# Patient Record
Sex: Female | Born: 1975
Health system: Southern US, Community
[De-identification: ages and names within clinical notes are randomized; demographics above are authoritative.]

## PROBLEM LIST (undated history)

## (undated) DIAGNOSIS — F410 Panic disorder [episodic paroxysmal anxiety] without agoraphobia: Secondary | ICD-10-CM

## (undated) DIAGNOSIS — I10 Essential (primary) hypertension: Secondary | ICD-10-CM

## (undated) DIAGNOSIS — J45909 Unspecified asthma, uncomplicated: Secondary | ICD-10-CM

## (undated) DIAGNOSIS — K659 Peritonitis, unspecified: Secondary | ICD-10-CM

## (undated) DIAGNOSIS — J189 Pneumonia, unspecified organism: Secondary | ICD-10-CM

## (undated) DIAGNOSIS — E669 Obesity, unspecified: Secondary | ICD-10-CM

## (undated) DIAGNOSIS — G43909 Migraine, unspecified, not intractable, without status migrainosus: Secondary | ICD-10-CM

## (undated) HISTORY — DX: Panic disorder (episodic paroxysmal anxiety): F41.0

## (undated) HISTORY — DX: Migraine, unspecified, not intractable, without status migrainosus: G43.909

## (undated) HISTORY — DX: Pneumonia, unspecified organism: J18.9

---

## 2007-06-24 HISTORY — PX: ABDOMINAL HYSTERECTOMY: SHX81

## 2012-06-23 DIAGNOSIS — K659 Peritonitis, unspecified: Secondary | ICD-10-CM

## 2012-06-23 HISTORY — DX: Peritonitis, unspecified: K65.9

## 2014-01-06 ENCOUNTER — Encounter (HOSPITAL_COMMUNITY): Payer: Self-pay | Admitting: Emergency Medicine

## 2014-01-06 ENCOUNTER — Emergency Department (HOSPITAL_COMMUNITY)
Admission: EM | Admit: 2014-01-06 | Discharge: 2014-01-06 | Disposition: A | Discharge status: Home / Self Care | Payer: Medicaid Other | Source: Home / Self Care | Attending: Emergency Medicine | Admitting: Emergency Medicine

## 2014-01-06 DIAGNOSIS — T300 Burn of unspecified body region, unspecified degree: Secondary | ICD-10-CM

## 2014-01-06 DIAGNOSIS — Y92009 Unspecified place in unspecified non-institutional (private) residence as the place of occurrence of the external cause: Secondary | ICD-10-CM

## 2014-01-06 DIAGNOSIS — IMO0002 Reserved for concepts with insufficient information to code with codable children: Secondary | ICD-10-CM

## 2014-01-06 DIAGNOSIS — T304 Corrosion of unspecified body region, unspecified degree: Secondary | ICD-10-CM

## 2014-01-06 MED ORDER — HYDROCODONE-ACETAMINOPHEN 5-325 MG PO TABS
ORAL_TABLET | ORAL | Status: DC
Start: 2014-01-06 — End: 2014-10-05

## 2014-01-06 MED ORDER — SILVER SULFADIAZINE 1 % EX CREA: 1.0000 "application " | TOPICAL_CREAM | Freq: Every day | CUTANEOUS | Status: DC

## 2014-01-06 NOTE — Discharge Instructions (Signed)
Burn Care Your skin is a natural barrier to infection. It is the largest organ of your body. Burns damage this natural protection. To help prevent infection, it is very important to follow your caregiver's instructions in the care of your burn. Burns are classified as:  First degree. There is only redness of the skin (erythema). No scarring is expected.  Second degree. There is blistering of the skin. Scarring may occur with deeper burns.  Third degree. All layers of the skin are injured, and scarring is expected. HOME CARE INSTRUCTIONS   Wash your hands well before changing your bandage.  Change your bandage as often as directed by your caregiver.  Remove the old bandage. If the bandage sticks, you may soak it off with cool, clean water.  Cleanse the burn thoroughly but gently with mild soap and water.  Pat the area dry with a clean, dry cloth.  Apply a thin layer of antibacterial cream to the burn.  Apply a clean bandage as instructed by your caregiver.  Keep the bandage as clean and dry as possible.  Elevate the affected area for the first 24 hours, then as instructed by your caregiver.  Only take over-the-counter or prescription medicines for pain, discomfort, or fever as directed by your caregiver. SEEK IMMEDIATE MEDICAL CARE IF:   You develop excessive pain.  You develop redness, tenderness, swelling, or red streaks near the burn.  The burned area develops yellowish-white fluid (pus) or a bad smell.  You have a fever. MAKE SURE YOU:   Understand these instructions.  Will watch your condition.  Will get help right away if you are not doing well or get worse. Document Released: 06/09/2005 Document Revised: 09/01/2011 Document Reviewed: 10/30/2010 [facility] Patient Information 2015 [place], [place]. This information is not intended to replace advice given to you by your health care provider. Make sure you discuss any questions you have with your health care  provider.  Chemical Burn Many chemicals can burn the skin. A chemical burn should be flushed with cool water and checked by an emergency caregiver. Your skin is a natural barrier to infection. It is the largest organ of your body. Burns damage this natural protection. To help prevent infection, it is very important to follow your caregiver's instructions in the care of your burn.  Many industrial chemicals may cause burns. These chemicals include acids, alkalis, and organic compounds such as petroleum, phenol, bitumen, tar, and grease. When acids come in contact with the skin, they cause an immediate change in the skin.Acid burns produce significant pain and form a scab (eschar). Usually, the immediate skin changes are the only damage from an acid burn.However, exposure to formic acid, chromic acid, or hydrofluoric acid may affect the whole body and may even be life-threatening. Alkalis include lye, cement, lime, and many chemicals with "hydroxide" in their name.An alkali burn may be less apparent than an acid burn at first. However, alkalis may cause greater tissue damage.It is important to be aware of any chemicals you are using. Treat any exposure to skin, eyes, or mucous membranes (nose, mouth, throat) as a potential emergency. PREVENTION  Avoid exposure to toxic chemicals that can cause burns.  Store chemicals out of the reach of children.  Use protective gloves when handling dangerous chemicals. HOME CARE INSTRUCTIONS   Wash your hands well before changing your bandage.  Change your bandage as often as directed by your caregiver.  Remove the old bandage. If the bandage sticks, you may soak it off  with cool, clean water.  Cleanse the burn thoroughly but gently with mild soap and water.  Pat the area dry with a clean, dry cloth.  Apply a thin layer of antibacterial cream to the burn.  Apply a clean bandage as instructed by your caregiver.  Keep the bandage as clean and dry as  possible.  Elevate the affected area for the first 24 hours, then as instructed by your caregiver.  Only take over-the-counter or prescription medicines for pain, discomfort, or fever as directed by your caregiver.  Keep all follow-up appointments.This is important. This is how your caregiver can tell if your treatment is working. SEEK IMMEDIATE MEDICAL CARE IF:   You develop excessive pain.  You develop redness, tenderness, swelling, or red streaks near the burn.  The burned area develops yellowish-white fluid (pus) or a bad smell.  You have a fever. MAKE SURE YOU:   Understand these instructions.  Will watch your condition.  Will get help right away if you are not doing well or get worse. Document Released: 03/15/2004 Document Revised: 09/01/2011 Document Reviewed: 11/04/2010 [facility] Patient Information 2015 [place], [place]. This information is not intended to replace advice given to you by your health care provider. Make sure you discuss any questions you have with your health care provider.
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
## 2014-03-20 ENCOUNTER — Emergency Department (HOSPITAL_COMMUNITY)
Admission: EM | Admit: 2014-03-20 | Discharge: 2014-03-20 | Disposition: A | Discharge status: Home / Self Care | Payer: Medicaid Other | Source: Home / Self Care | Attending: Emergency Medicine | Admitting: Emergency Medicine

## 2014-03-20 ENCOUNTER — Encounter (HOSPITAL_COMMUNITY): Payer: Self-pay | Admitting: Emergency Medicine

## 2014-03-20 DIAGNOSIS — G43009 Migraine without aura, not intractable, without status migrainosus: Secondary | ICD-10-CM

## 2014-03-20 DIAGNOSIS — I10 Essential (primary) hypertension: Secondary | ICD-10-CM

## 2014-03-20 DIAGNOSIS — J4520 Mild intermittent asthma, uncomplicated: Secondary | ICD-10-CM

## 2014-03-20 HISTORY — DX: Unspecified asthma, uncomplicated: J45.909

## 2014-03-20 HISTORY — DX: Essential (primary) hypertension: I10

## 2014-03-20 MED ORDER — METOCLOPRAMIDE HCL 10 MG PO TABS: ORAL_TABLET | ORAL | Status: DC

## 2014-03-20 MED ORDER — AMLODIPINE BESYLATE 10 MG PO TABS: 10.0000 mg | ORAL_TABLET | Freq: Every day | ORAL | Status: DC

## 2014-03-20 MED ORDER — KETOROLAC TROMETHAMINE 60 MG/2ML IM SOLN
60.0000 mg | Freq: Once | INTRAMUSCULAR | Status: AC
  Administered 2014-03-20: 60 mg via INTRAMUSCULAR

## 2014-03-20 MED ORDER — METOCLOPRAMIDE HCL 5 MG/ML IJ SOLN
INTRAMUSCULAR | Status: AC
  Filled 2014-03-20: qty 2

## 2014-03-20 MED ORDER — NAPROXEN 500 MG PO TABS: ORAL_TABLET | ORAL | Status: DC

## 2014-03-20 MED ORDER — KETOROLAC TROMETHAMINE 60 MG/2ML IM SOLN
INTRAMUSCULAR | Status: AC
  Filled 2014-03-20: qty 2

## 2014-03-20 MED ORDER — ALBUTEROL SULFATE HFA 108 (90 BASE) MCG/ACT IN AERS: 2.0000 | INHALATION_SPRAY | RESPIRATORY_TRACT | Status: DC | PRN

## 2014-03-20 MED ORDER — METOCLOPRAMIDE HCL 5 MG/ML IJ SOLN
10.0000 mg | Freq: Once | INTRAMUSCULAR | Status: AC
  Administered 2014-03-20: 10 mg via INTRAMUSCULAR

## 2014-03-20 MED ORDER — FLUTICASONE-SALMETEROL 100-50 MCG/DOSE IN AEPB: 1.0000 | INHALATION_SPRAY | Freq: Two times a day (BID) | RESPIRATORY_TRACT | Status: DC

## 2014-03-20 NOTE — ED Provider Notes (Signed)
Chief Complaint   Medication Refill   History of Present Illness   Stacey Price is a 38 year old female who has had a one-week history of intermittent left-sided throbbing headaches that come and go. She thinks they may have been brought on by stress. She was diagnosed as having migraine headaches 15 years ago. She did take medication for them, but cannot return to the names of the meds. She's not taking anything right now. The headaches are rated 8/10 at the most and now are down to 7/10. They're accompanied by nausea, vomiting, photophobia, and phonophobia. They're worse with activity and better with rest. She denies any associated visual symptoms, diplopia, or eye pain. She notes both legs have felt numb and tingly for the last week and she has felt a little off balance. She denies any numbness of the arms or the face. No weakness of the arms or legs. No difficulty speaking, swallowing, or with coordination, balance, or equilibrium. She denies any fever, chills, stiff neck, or URI symptoms. She's not on any anticoagulants, has no history of head or neck trauma, she's not pregnant or in the immediate postpartum period. Patient states it was hours from onset of the headache to peak intensity. She denies any eye pain. No neck pain or recent chiropractic treatment. No jaw claudication. No history of thrombophlebitis or use of birth control pills.  She also needs refills on blood pressure meds. [place] She does not have a primary care physician yet. She was on Norvasc 10 mg a day. She denies any medication side effects. She's had no chest pain, shortness of breath, ankle edema, or strokelike symptoms. She also has a history of asthma and has been maintained on albuterol and Advair. She's not having any coughing or wheezing right now.  Review of Systems   Other than as noted above, the patient denies any of the following symptoms: Systemic:  No fever, chills,  photophobia, or stiff neck. Eye:  No blurred vision, or diplopia. ENT:  No nasal congestion, rhinorrhea, sinus pressure or pain, or sore throat.  No jaw claudication. Neuro:  No paresthesias, loss of consciousness, seizure activity, muscle weakness, trouble with coordination or gait, trouble speaking or swallowing. Psych:  No depression, anxiety or trouble sleeping.  PMFSH   Past medical history, family history, social history, meds, and allergies were reviewed.    Physical Examination    Vital signs:  BP 128/83  Pulse 61  Temp(Src) 98.4 F (36.9 C) (Oral)  Resp 14  SpO2 100% General:  Alert and oriented.  In no distress. Eye:  Lids and conjunctivas normal.  PERRL,  Full EOMs.  Fundi benign with normal discs and vessels. There was no papilledema.  ENT:  No cranial or facial tenderness to palpation.  TMs and canals clear.  Nasal mucosa was normal and uncongested without any drainage. No intra oral lesions, pharynx clear, mucous membranes moist, dentition normal. Neck:  Supple, full ROM, no tenderness to palpation.  No adenopathy or mass. Lungs: Clear to auscultation. Heart: Rhythm, no gallop or murmur. Neuro:  Alert and orented times 3.  Speech was clear, fluent, and appropriate.  Cranial nerves intact. No pronator drift, muscle strength normal. Finger to nose normal.  DTRs were 2+ and symmetrical.Station and gait were normal.  Romberg's sign was normal.  Able to perform tandem gait well. Psych:  Normal affect.  Course in Urgent Care Center     The patient was given the following meds:  Medications  ketorolac (TORADOL) injection 60 mg (60 mg Intramuscular Given 03/20/14 1215)  metoCLOPramide (REGLAN) injection 10 mg (10 mg Intramuscular Given 03/20/14 1215)   Assessment   The primary encounter diagnosis was Migraine without aura and without status migrainosus, not intractable. Diagnoses of Essential hypertension and Asthma, mild intermittent, uncomplicated were also pertinent to  this visit.  There is no evidence of subarachnoid hemorrhage, meningitis, encephalitis, subdural hematoma, epidural hematoma, acute glaucoma, carotid dissection, temporal arteritis, cavernous sinus thrombosis, carbon monoxide toxicity, or pseudotumor cerebri.    Plan   1.  Meds:  The following meds were prescribed:   Discharge Medication List as of 03/20/2014 11:56 AM    START taking these medications   Details  albuterol (PROVENTIL HFA;VENTOLIN HFA) 108 (90 BASE) MCG/ACT inhaler Inhale 2 puffs into the lungs every 4 (four) hours as needed for wheezing or shortness of breath., Starting 03/20/2014, Until Discontinued, Normal    amLODipine (NORVASC) 10 MG tablet Take 1 tablet (10 mg total) by mouth daily., Starting 03/20/2014, Until Discontinued, Normal    Fluticasone-Salmeterol (ADVAIR DISKUS) 100-50 MCG/DOSE AEPB Inhale 1 puff into the lungs 2 (two) times daily., Starting 03/20/2014, Until Discontinued, Normal    metoCLOPramide (REGLAN) 10 MG tablet 1 every 12 hours as needed for headache, Normal    naproxen (NAPROSYN) 500 MG tablet 1 every 12 hours as needed for headache, Normal        2.  Patient Education/Counseling:  The patient was given appropriate handouts, self care instructions, and instructed in pain control.  I suggested she take a Benadryl when she gets home and may take Benadryl along with a metoclopramide and naproxen for acute headaches. Suggested she followup with primary care as soon as possible.  3.  Follow up:  The patient was told to follow up here if no better in 2 to 3 days, or sooner if becoming worse in any way, and given some red flag symptoms such as fever, worsening pain, persistent vomiting, or new neurological symptoms which would prompt immediate return.       Reuben Likes, MD 03/20/14 [phone]

## 2014-03-20 NOTE — Discharge Instructions (Signed)
Migraine Headache A migraine headache is an intense, throbbing pain on one or both sides of your head. A migraine can last for 30 minutes to several hours. CAUSES  The exact cause of a migraine headache is not always known. However, a migraine may be caused when nerves in the brain become irritated and release chemicals that cause inflammation. This causes pain. Certain things may also trigger migraines, such as:  Alcohol.  Smoking.  Stress.  Menstruation.  Aged cheeses.  Foods or drinks that contain nitrates, glutamate, aspartame, or tyramine.  Lack of sleep.  Chocolate.  Caffeine.  Hunger.  Physical exertion.  Fatigue.  Medicines used to treat chest pain (nitroglycerine), birth control pills, estrogen, and some blood pressure medicines. SIGNS AND SYMPTOMS  Pain on one or both sides of your head.  Pulsating or throbbing pain.  Severe pain that prevents daily activities.  Pain that is aggravated by any physical activity.  Nausea, vomiting, or both.  Dizziness.  Pain with exposure to bright lights, loud noises, or activity.  General sensitivity to bright lights, loud noises, or smells. Before you get a migraine, you may get warning signs that a migraine is coming (aura). An aura may include:  Seeing flashing lights.  Seeing bright spots, halos, or zigzag lines.  Having tunnel vision or blurred vision.  Having feelings of numbness or tingling.  Having trouble talking.  Having muscle weakness. DIAGNOSIS  A migraine headache is often diagnosed based on:  Symptoms.  Physical exam.  A CT scan or MRI of your head. These imaging tests cannot diagnose migraines, but they can help rule out other causes of headaches. TREATMENT Medicines may be given for pain and nausea. Medicines can also be given to help prevent recurrent migraines.  HOME CARE INSTRUCTIONS  Only take over-the-counter or prescription medicines for pain or discomfort as directed by your  health care provider. The use of long-term narcotics is not recommended.  Lie down in a dark, quiet room when you have a migraine.  Keep a journal to find out what may trigger your migraine headaches. For example, write down:  What you eat and drink.  How much sleep you get.  Any change to your diet or medicines.  Limit alcohol consumption.  Quit smoking if you smoke.  Get 7-9 hours of sleep, or as recommended by your health care provider.  Limit stress.  Keep lights dim if bright lights bother you and make your migraines worse. SEEK IMMEDIATE MEDICAL CARE IF:   Your migraine becomes severe.  You have a fever.  You have a stiff neck.  You have vision loss.  You have muscular weakness or loss of muscle control.  You start losing your balance or have trouble walking.  You feel faint or pass out.  You have severe symptoms that are different from your first symptoms. MAKE SURE YOU:   Understand these instructions.  Will watch your condition.  Will get help right away if you are not doing well or get worse. Document Released: 06/09/2005 Document Revised: 10/24/2013 Document Reviewed: 02/14/2013 [facility] Patient Information 2015 [place], [place]. This information is not intended to replace advice given to you by your health care provider. Make sure you discuss any questions you have with your health care provider.  Asthma Attack Prevention Although there is no way to prevent asthma from starting, you can take steps to control the disease and reduce its symptoms. Learn about your asthma and how to control it. Take an active role to  control your asthma by working with your health care provider to create and follow an asthma action plan. An asthma action plan guides you in:  Taking your medicines properly.  Avoiding things that set off your asthma or make your asthma worse (asthma triggers).  Tracking your level of asthma control.  Responding to worsening  asthma.  Seeking emergency care when needed. To track your asthma, keep records of your symptoms, check your peak flow number using a handheld device that shows how well air moves out of your lungs (peak flow meter), and get regular asthma checkups.  WHAT ARE SOME WAYS TO PREVENT AN ASTHMA ATTACK?  Take medicines as directed by your health care provider.  Keep track of your asthma symptoms and level of control.  With your health care provider, write a detailed plan for taking medicines and managing an asthma attack. Then be sure to follow your action plan. Asthma is an ongoing condition that needs regular monitoring and treatment.  Identify and avoid asthma triggers. Many outdoor allergens and irritants (such as pollen, mold, cold air, and air pollution) can trigger asthma attacks. Find out what your asthma triggers are and take steps to avoid them.  Monitor your breathing. Learn to recognize warning signs of an attack, such as coughing, wheezing, or shortness of breath. Your lung function may decrease before you notice any signs or symptoms, so regularly measure and record your peak airflow with a home peak flow meter.  Identify and treat attacks early. If you act quickly, you are less likely to have a severe attack. You will also need less medicine to control your symptoms. When your peak flow measurements decrease and alert you to an upcoming attack, take your medicine as instructed and immediately stop any activity that may have triggered the attack. If your symptoms do not improve, get medical help.  Pay attention to increasing quick-relief inhaler use. If you find yourself relying on your quick-relief inhaler, your asthma is not under control. See your health care provider about adjusting your treatment. WHAT CAN MAKE MY SYMPTOMS WORSE? A number of common things can set off or make your asthma symptoms worse and cause temporary increased inflammation of your airways. Keep track of your  asthma symptoms for several weeks, detailing all the environmental and emotional factors that are linked with your asthma. When you have an asthma attack, go back to your asthma diary to see which factor, or combination of factors, might have contributed to it. Once you know what these factors are, you can take steps to control many of them. If you have allergies and asthma, it is important to take asthma prevention steps at home. Minimizing contact with the substance to which you are allergic will help prevent an asthma attack. Some triggers and ways to avoid these triggers are: Animal Dander:  Some people are allergic to the flakes of skin or dried saliva from animals with fur or feathers.   There is no such thing as a hypoallergenic dog or cat breed. All dogs or cats can cause allergies, even if they don't shed.  Keep these pets out of your home.  If you are not able to keep a pet outdoors, keep the pet out of your bedroom and other sleeping areas at all times, and keep the door closed.  Remove carpets and furniture covered with cloth from your home. If that is not possible, keep the pet away from fabric-covered furniture and carpets. Dust Mites: Many people with asthma are  allergic to dust mites. Dust mites are tiny bugs that are found in every home in mattresses, pillows, carpets, fabric-covered furniture, bedcovers, clothes, stuffed toys, and other fabric-covered items.   Cover your mattress in a special dust-proof cover.  Cover your pillow in a special dust-proof cover, or wash the pillow each week in hot water. Water must be hotter than 130 F (54.4 C) to kill dust mites. Cold or warm water used with detergent and bleach can also be effective.  Wash the sheets and blankets on your bed each week in hot water.  Try not to sleep or lie on cloth-covered cushions.  Call ahead when traveling and ask for a smoke-free hotel room. Bring your own bedding and pillows in case the hotel only  supplies feather pillows and down comforters, which may contain dust mites and cause asthma symptoms.  Remove carpets from your bedroom and those laid on concrete, if you can.  Keep stuffed toys out of the bed, or wash the toys weekly in hot water or cooler water with detergent and bleach. Cockroaches: Many people with asthma are allergic to the droppings and remains of cockroaches.   Keep food and garbage in closed containers. Never leave food out.  Use poison baits, traps, powders, gels, or paste (for example, boric acid).  If a spray is used to kill cockroaches, stay out of the room until the odor goes away. Indoor Mold:  Fix leaky faucets, pipes, or other sources of water that have mold around them.  Clean floors and moldy surfaces with a fungicide or diluted bleach.  Avoid using humidifiers, vaporizers, or swamp coolers. These can spread molds through the air. Pollen and Outdoor Mold:  When pollen or mold spore counts are high, try to keep your windows closed.  Stay indoors with windows closed from late morning to afternoon. Pollen and some mold spore counts are highest at that time.  Ask your health care provider whether you need to take anti-inflammatory medicine or increase your dose of the medicine before your allergy season starts. Other Irritants to Avoid:  Tobacco smoke is an irritant. If you smoke, ask your health care provider how you can quit. Ask family members to quit smoking, too. Do not allow smoking in your home or car.  If possible, do not use a wood-burning stove, kerosene heater, or fireplace. Minimize exposure to all sources of smoke, including incense, candles, fires, and fireworks.  Try to stay away from strong odors and sprays, such as perfume, talcum powder, hair spray, and paints.  Decrease humidity in your home and use an indoor air cleaning device. Reduce indoor humidity to below 60%. Dehumidifiers or central air conditioners can do this.  Decrease  house dust exposure by changing furnace and air cooler filters frequently.  Try to have someone else vacuum for you once or twice a week. Stay out of rooms while they are being vacuumed and for a short while afterward.  If you vacuum, use a dust mask from a hardware store, a double-layered or microfilter vacuum cleaner bag, or a vacuum cleaner with a HEPA filter.  Sulfites in foods and beverages can be irritants. Do not drink beer or wine or eat dried fruit, processed potatoes, or shrimp if they cause asthma symptoms.  Cold air can trigger an asthma attack. Cover your nose and mouth with a scarf on cold or windy days.  Several health conditions can make asthma more difficult to manage, including a runny nose, sinus infections, reflux disease,  psychological stress, and sleep apnea. Work with your health care provider to manage these conditions.  Avoid close contact with people who have a respiratory infection such as a cold or the flu, since your asthma symptoms may get worse if you catch the infection. Wash your hands thoroughly after touching items that may have been handled by people with a respiratory infection.  Get a flu shot every year to protect against the flu virus, which often makes asthma worse for days or weeks. Also get a pneumonia shot if you have not previously had one. Unlike the flu shot, the pneumonia shot does not need to be given yearly. Medicines:  Talk to your health care provider about whether it is safe for you to take aspirin or non-steroidal anti-inflammatory medicines (NSAIDs). In a small number of people with asthma, aspirin and NSAIDs can cause asthma attacks. These medicines must be avoided by people who have known aspirin-sensitive asthma. It is important that people with aspirin-sensitive asthma read labels of all over-the-counter medicines used to treat pain, colds, coughs, and fever.  Beta-blockers and ACE inhibitors are other medicines you should discuss with  your health care provider. HOW CAN I FIND OUT WHAT I AM ALLERGIC TO? Ask your asthma health care provider about allergy skin testing or blood testing (the RAST test) to identify the allergens to which you are sensitive. If you are found to have allergies, the most important thing to do is to try to avoid exposure to any allergens that you are sensitive to as much as possible. Other treatments for allergies, such as medicines and allergy shots (immunotherapy) are available.  CAN I EXERCISE? Follow your health care provider's advice regarding asthma treatment before exercising. It is important to maintain a regular exercise program, but vigorous exercise or exercise in cold, humid, or dry environments can cause asthma attacks, especially for those people who have exercise-induced asthma. Document Released: 05/28/2009 Document Revised: 06/14/2013 Document Reviewed: 12/15/2012 [facility] Patient Information 2015 [place], [place]. This information is not intended to replace advice given to you by your health care provider. Make sure you discuss any questions you have with your health care provider.  Blood pressure over the ideal can put you at higher risk for stroke, heart disease, and kidney failure.  For this reason, it's important to try to get your blood pressure as close as possible to the ideal.  The ideal blood pressure is 120/80.  Blood pressures from 120-139 systolic over 80-89 diastolic are labeled as "prehypertension."  This means you are at higher risk of developing hypertension in the future.  Blood pressures in this range are not treated with medication, but lifestyle changes are recommended to prevent progression to hypertension.  Blood pressures of 140 and above systolic over 90 and above diastolic are classified as hypertension and are treated with medications.  Lifestyle changes which can benefit both prehypertension and hypertension include the following:   Salt and sodium  restriction.  Weight loss.  Regular exercise.  Avoidance of tobacco.  Avoidance of excess alcohol.  The "D.A.S.H" diet.   People with hypertension and prehypertension should limit their salt intake to less than 1500 mg daily.  Reading the nutrition information on the label of many prepared foods can give you an idea of how much sodium you're consuming at each meal.  Remember that the most important number on the nutrition information is the serving size.  It may be smaller than you think.  Try to avoid adding extra salt at  the table.  You may add small amounts of salt while cooking.  Remember that salt is an acquired taste and you may get used to a using a whole lot less salt than you are using now.  Using less salt lets the food's natural flavors come through.  You might want to consider using salt substitutes, potassium chloride, pepper, or blends of herbs and spices to enhance the flavor of your food.  Foods that contain the most salt include: processed meats (like ham, bacon, lunch meat, sausage, hot dogs, and breakfast meat), chips, pretzels, salted nuts, soups, salty snacks, canned foods, junk food, fast food, restaurant food, mustard, pickles, pizza, popcorn, soy sauce, and worcestershire sauce--quite a list!  You might ask, "Is there anything I can eat?"  The answer is, "yes."  Fruits and vegetables are usually low in salt.  Fresh is better than frozen which is better than canned.  If you have canned vegetables, you can cut down on the salt content by rinsing them in tap water 3 times before cooking.     Weight loss is the second thing you can do to lower your blood pressure.  Getting to and maintaining ideal weight will often normalize your blood pressure and allow you to avoid medications, entirely, cut way down on your dosage of medications, or allow to wean off your meds.  (Note, this should only be done under the supervision of your primary care doctor.)  Of course, weight loss takes  time and you may need to be on medication in the meantime.  You shoot for a body mass index of 20-25.  When you go to the urgent care or to your primary care doctor, they should calculate your BMI.  If you don't know what it is, ask.  You can calculate your BMI with the following formula:  Weight in pounds x 703/ (height in inches) x (height in inches).  There are many good diets out there: Weight Watchers and the D.A.S.H. Diet are the best, but often, just modifying a few factors can be helpful:  Don't skip meals, don't eat out, and keeping a food diary.  I do not recommend fad diets or diet pills which often raise blood pressure.    Everyone should get regular exercise, but this is particularly important for people with high blood pressure.  Just about any exercise is good.  The only exercise which may be harmful is lifting extreme heavy weights.  I recommend moderate exercise such as walking for 30 minutes 5 days a week.  Going to the gym for a 50 minute workout 3 times a week is also good.  This amounts to 150 minutes of exercise weekly.   Anyone with high blood pressure should avoid any use of tobacco.  Tobacco use does not elevate blood pressure, but it increases the risk of heart disease and stroke.  If you are interested in quitting, discuss with your doctor how to quit.  If you are not interested in quitting, ask yourself, "What would my life be like in 10 years if I continue to smoke?"  "How will I know when it is time to quit?"  "How would my life be better if I were to quit."   Excess alcohol intake can raise the blood pressure.  The safe alcohol intake is 2 drinks or less per day for men and 1 drink per day or less for women.   There is a very good diet which I recommend that has  been designed for people with blood pressure called the D.A.S.H. Diet (dietary approaches to stop hypertension).  It consists of fruits, vegetables, lean meats, low fat dairy, whole grains, nuts and seeds.  It is  very low in salt and sodium.  It has also been found to have other beneficial health effects such as lowering cholesterol and helping lose weight.  It has been developed by the [employer] and can be downloaded from the internet without any cost. Just do a Programmer, multimedia on "D.A.S.H. Diet." or go the NIH website (GolfingPosters.tn).  There are also cookbooks and diet plans that can be gotten from [place] to help you with this diet.

## 2014-03-20 NOTE — ED Notes (Addendum)
States she has not had her medication for BP since March or breathing since August and needs refills. NAD at present

## 2014-03-29 ENCOUNTER — Encounter (HOSPITAL_COMMUNITY): Payer: Self-pay | Admitting: Emergency Medicine

## 2014-03-29 ENCOUNTER — Emergency Department (INDEPENDENT_AMBULATORY_CARE_PROVIDER_SITE_OTHER)
Admission: EM | Admit: 2014-03-29 | Discharge: 2014-03-29 | Disposition: A | Discharge status: Home / Self Care | Payer: Medicaid Other | Source: Home / Self Care | Attending: Family Medicine | Admitting: Family Medicine

## 2014-03-29 DIAGNOSIS — R110 Nausea: Secondary | ICD-10-CM

## 2014-03-29 LAB — POCT URINALYSIS DIP (DEVICE)
Bilirubin Urine: NEGATIVE
GLUCOSE, UA: NEGATIVE mg/dL
Hgb urine dipstick: NEGATIVE
KETONES UR: NEGATIVE mg/dL
Leukocytes, UA: NEGATIVE
Nitrite: NEGATIVE
PROTEIN: NEGATIVE mg/dL
SPECIFIC GRAVITY, URINE: 1.02 (ref 1.005–1.030)
UROBILINOGEN UA: 0.2 mg/dL (ref 0.0–1.0)
pH: 6 (ref 5.0–8.0)

## 2014-03-29 MED ORDER — ONDANSETRON HCL 4 MG PO TABS: 4.0000 mg | ORAL_TABLET | Freq: Three times a day (TID) | ORAL | Status: DC | PRN

## 2014-03-29 NOTE — ED Provider Notes (Signed)
CSN: [id number]     Arrival date & time 03/29/14  1104 History   None    Chief Complaint  Patient presents with  . Back Pain   (Consider location/radiation/quality/duration/timing/severity/associated sxs/prior Treatment) HPI Comments: Patient presents requesting evaluation of a three year history of nausea. States she is nauseated on a daily basis, will occasionally have associated vomiting, migraine headache, and diaphoresis with waves of nausea. Denies chest pain, dyspnea, dysuria, hematuria, hematemesis. States she will often take Pepto Bismol for symptoms and this makes her stools dark. States that she is new to the [place] area and does not have PCP. Endorses that she has had multiple evaluations for these symptoms in the past, but previous providers have not been able to provide diagnosis or cause for symptoms. Denies symptoms of heartburn or acid reflux. Reports symptoms are often most noticeable in the mornings and are not exacerbated by meal intake Continues to donate plasma twice a week. Denies fever Works at [employer] Is a non smoker Does not drink alcohol to excess.  States her daughter was ill will gastroenteritis about two weeks ago.   The history is provided by the patient.    Past Medical History  Diagnosis Date  . Asthma   . Hypertension    History reviewed. No pertinent past surgical history. History reviewed. No pertinent family history. History  Substance Use Topics  . Smoking status: Current Some Day Smoker  . Smokeless tobacco: Not on file  . Alcohol Use: Yes   OB History   Grav Para Term Preterm Abortions TAB SAB Ect Mult Living                 Review of Systems  All other systems reviewed and are negative.   Allergies  Review of patient's allergies indicates no known allergies.  Home Medications   Prior to Admission medications   Medication Sig Start Date End Date Taking? Authorizing Provider  albuterol (PROVENTIL HFA;VENTOLIN HFA) 108 (90  BASE) MCG/ACT inhaler Inhale 2 puffs into the lungs every 4 (four) hours as needed for wheezing or shortness of breath. 03/20/14   Reuben Likesavid C Keller, MD  amLODipine (NORVASC) 10 MG tablet Take 1 tablet (10 mg total) by mouth daily. 03/20/14   Reuben Likesavid C Keller, MD  Fluticasone-Salmeterol (ADVAIR DISKUS) 100-50 MCG/DOSE AEPB Inhale 1 puff into the lungs 2 (two) times daily. 03/20/14   Reuben Likesavid C Keller, MD  HYDROcodone-acetaminophen (NORCO/VICODIN) 5-325 MG per tablet 1 to 2 tabs every 4 to 6 hours as needed for pain. 01/06/14   Reuben Likesavid C Keller, MD  metoCLOPramide (REGLAN) 10 MG tablet 1 every 12 hours as needed for headache 03/20/14   Reuben Likesavid C Keller, MD  naproxen (NAPROSYN) 500 MG tablet 1 every 12 hours as needed for headache 03/20/14   Reuben Likesavid C Keller, MD  ondansetron (ZOFRAN) 4 MG tablet Take 1 tablet (4 mg total) by mouth every 8 (eight) hours as needed for nausea or vomiting. 03/29/14   Mathis FareJennifer Lee H Presson, PA  silver sulfADIAZINE (SILVADENE) 1 % cream Apply 1 application topically daily. 01/06/14   Reuben Likesavid C Keller, MD   BP 120/81  Temp(Src) 98.4 F (36.9 C) (Oral)  SpO2 98% Physical Exam  Nursing note and vitals reviewed. Constitutional: She is oriented to person, place, and time. She appears well-developed and well-nourished. No distress.  +overweight  HENT:  Head: Normocephalic and atraumatic.  Mouth/Throat: Oropharynx is clear and moist.  Eyes: Conjunctivae are normal. No scleral icterus.  Neck: Normal range of  motion. Neck supple.  Cardiovascular: Normal rate, regular rhythm and normal heart sounds.   Pulmonary/Chest: Effort normal and breath sounds normal. No respiratory distress. She has no wheezes.  Abdominal: Soft. Normal appearance and bowel sounds are normal. There is no hepatosplenomegaly. There is no tenderness. There is no CVA tenderness.  Exam somewhat limited by body habitus  Musculoskeletal: Normal range of motion.  Lymphadenopathy:    She has no cervical adenopathy.  Neurological:  She is alert and oriented to person, place, and time. She has normal strength. No sensory deficit. Coordination and gait normal.  Skin: Skin is warm and dry. No rash noted. No erythema.  Psychiatric: She has a normal mood and affect. Her behavior is normal.    ED Course  Procedures (including critical care time) Labs Review Labs Reviewed  POCT URINALYSIS DIP (DEVICE)    Imaging Review No results found.   MDM   1. Chronic nausea    Patient and I spoke about her nausea of unknown etiology. UA was normal. Is s/p hysterectomy. Suggested she try eating a small snack before getting out of bed in the morning and perhaps taking a daily OTC H2 blocker or PPI. I also suggested that she take a break from frequent plasma donation to see if this offers her some improvement. Provided resource for PCP accepting new patients in the area and suggested she might also wish to follow up with [facility] for additional evaluation.    Ria Clock, [place] 03/29/14 1242

## 2014-03-29 NOTE — Discharge Instructions (Signed)
I am sorry that you suffer from both chronic headaches and chronic nausea. I would try using zofran as prescribed and perhaps keeping some soda crackers and ginger ale on your bedside table and eating a small snack before getting out of bed each morning. You may want to limit the number of times per week you donate plasma to see if this offers improvement of symptoms. Try contacting Dr. Chauncy Passyewey's office to establish for primary care. You may also with to be evaluated by [facility] if nausea persists.  Nausea, Adult Nausea is the feeling that you have an upset stomach or have to vomit. Nausea by itself is not likely a serious concern, but it may be an early sign of more serious medical problems. As nausea gets worse, it can lead to vomiting. If vomiting develops, there is the risk of dehydration.  CAUSES   Viral infections.  Food poisoning.  Medicines.  Pregnancy.  Motion sickness.  Migraine headaches.  Emotional distress.  Severe pain from any source.  Alcohol intoxication. HOME CARE INSTRUCTIONS  Get plenty of rest.  Ask your caregiver about specific rehydration instructions.  Eat small amounts of food and sip liquids more often.  Take all medicines as told by your caregiver. SEEK MEDICAL CARE IF:  You have not improved after 2 days, or you get worse.  You have a headache. SEEK IMMEDIATE MEDICAL CARE IF:   You have a fever.  You faint.  You keep vomiting or have blood in your vomit.  You are extremely weak or dehydrated.  You have dark or bloody stools.  You have severe chest or abdominal pain. MAKE SURE YOU:  Understand these instructions.  Will watch your condition.  Will get help right away if you are not doing well or get worse. Document Released: 07/17/2004 Document Revised: 03/03/2012 Document Reviewed: 02/19/2011 [facility] Patient Information 2015 [employer] [place]. This information is not intended to replace advice given to you by your health  care provider. Make sure you discuss any questions you have with your health care provider.

## 2014-03-29 NOTE — ED Notes (Signed)
Has 1 week duration of pain in mid back, nausea, sweating. Denies pain at present. NAD at present

## 2014-03-31 NOTE — ED Provider Notes (Signed)
Medical screening examination/treatment/procedure(s) were performed by a resident physician or non-physician practitioner and as the supervising physician I was immediately available for consultation/collaboration.  Vianna Venezia, MD    Anisten Tomassi S Evangelyn Crouse, MD 03/31/14 0817 

## 2014-04-28 ENCOUNTER — Emergency Department (HOSPITAL_COMMUNITY): Payer: Medicaid Other

## 2014-04-28 ENCOUNTER — Encounter (HOSPITAL_COMMUNITY): Payer: Self-pay

## 2014-04-28 ENCOUNTER — Emergency Department (HOSPITAL_COMMUNITY)
Admission: EM | Admit: 2014-04-28 | Discharge: 2014-04-28 | Disposition: A | Discharge status: Other Acute Inpatient Hospital | Payer: Medicaid Other | Source: Home / Self Care | Attending: Family Medicine | Admitting: Family Medicine

## 2014-04-28 ENCOUNTER — Emergency Department (HOSPITAL_COMMUNITY)
Admission: EM | Admit: 2014-04-28 | Discharge: 2014-04-28 | Disposition: A | Discharge status: Home / Self Care | Payer: Medicaid Other | Attending: Emergency Medicine | Admitting: Emergency Medicine

## 2014-04-28 ENCOUNTER — Encounter (HOSPITAL_COMMUNITY): Payer: Self-pay | Admitting: *Deleted

## 2014-04-28 DIAGNOSIS — M25562 Pain in left knee: Secondary | ICD-10-CM | POA: Diagnosis not present

## 2014-04-28 DIAGNOSIS — G8929 Other chronic pain: Secondary | ICD-10-CM | POA: Insufficient documentation

## 2014-04-28 DIAGNOSIS — J45909 Unspecified asthma, uncomplicated: Secondary | ICD-10-CM | POA: Insufficient documentation

## 2014-04-28 DIAGNOSIS — Z79899 Other long term (current) drug therapy: Secondary | ICD-10-CM | POA: Insufficient documentation

## 2014-04-28 DIAGNOSIS — E669 Obesity, unspecified: Secondary | ICD-10-CM | POA: Insufficient documentation

## 2014-04-28 DIAGNOSIS — I10 Essential (primary) hypertension: Secondary | ICD-10-CM | POA: Insufficient documentation

## 2014-04-28 DIAGNOSIS — Z792 Long term (current) use of antibiotics: Secondary | ICD-10-CM | POA: Diagnosis not present

## 2014-04-28 DIAGNOSIS — M25569 Pain in unspecified knee: Secondary | ICD-10-CM

## 2014-04-28 DIAGNOSIS — M79675 Pain in left toe(s): Secondary | ICD-10-CM | POA: Insufficient documentation

## 2014-04-28 DIAGNOSIS — Z7951 Long term (current) use of inhaled steroids: Secondary | ICD-10-CM | POA: Diagnosis not present

## 2014-04-28 DIAGNOSIS — Z720 Tobacco use: Secondary | ICD-10-CM | POA: Insufficient documentation

## 2014-04-28 HISTORY — DX: Obesity, unspecified: E66.9

## 2014-04-28 MED ORDER — OXYCODONE-ACETAMINOPHEN 5-325 MG PO TABS: 1.0000 | ORAL_TABLET | ORAL | Status: DC | PRN

## 2014-04-28 MED ORDER — INDOMETHACIN 25 MG PO CAPS: 25.0000 mg | ORAL_CAPSULE | Freq: Two times a day (BID) | ORAL | Status: DC

## 2014-04-28 NOTE — Discharge Instructions (Signed)
Take the prescribed medication as directed. °Follow-up with a primary care physician in the area.  Resource guide attached to help with this. °Return to the ED for new or worsening symptoms. ° ° °Emergency Department Resource Guide °1) Find a Doctor and Pay Out of Pocket °Although you won't have to find out who is covered by your insurance plan, it is a good idea to ask around and get recommendations. You will then need to call the office and see if the doctor you have chosen will accept you as a new patient and what types of options they offer for patients who are self-pay. Some doctors offer discounts or will set up payment plans for their patients who do not have insurance, but you will need to ask so you aren't surprised when you get to your appointment. ° °2) Contact Your Local Health Department °Not all health departments have doctors that can see patients for sick visits, but many do, so it is worth a call to see if yours does. If you don't know where your local health department is, you can check in your phone book. The CDC also has a tool to help you locate your state's health department, and many state websites also have listings of all of their local health departments. ° °3) Find a Walk-in Clinic °If your illness is not likely to be very severe or complicated, you may want to try a walk in clinic. These are popping up all over the country in pharmacies, drugstores, and shopping centers. They're usually staffed by nurse practitioners or physician assistants that have been trained to treat common illnesses and complaints. They're usually fairly quick and inexpensive. However, if you have serious medical issues or chronic medical problems, these are probably not your best option. ° °No Primary Care Doctor: °- Call [facility] at  832-8000 - they can help you locate a primary care doctor that  accepts your insurance, provides certain services, etc. °- Physician Referral Service- 1-800-533-3463 ° °Chronic  Pain Problems: °Organization         Address  Phone   Notes  °[facility]  (336) 297-2271 Patients need to be referred by their primary care doctor.  ° °Medication Assistance: °Organization         Address  Phone   Notes  °[place] [facility] [address] °[place], [place] [address] (336) 641-8030 --Must be a resident of [place] °-- Must have NO insurance coverage whatsoever (no Medicaid/ Medicare, etc.) °-- The pt. MUST have a primary care doctor that directs their care regularly and follows them in the community °  °[facility]  (866) 331-1348   °[employer]  (888) 892-1162   ° °Agencies that provide inexpensive medical care: °Organization         Address  Phone   Notes  °[facility]  (336) 832-8035   °[facility]    (336) 832-7272   °[facility] [address] °[place], [place] [address] (336) 832-4777   °[facility] [address], °[place] (336) 271-4999   °[facility]    (336) 373-0678   °[facility]    (336) 272-1050   °[facility] ° [address], [place] Phone:  (336) 832-4444, Fax:  (336) 832-4440 Hours of Operation:  9 am - 6 pm, M-F.  Also accepts Medicaid/Medicare and self-pay.  °[facility] °   [address], [place] Phone: (336) 832-3150, Fax: (336) 832-3151. Hours of Operation:  8:30 am - 5:30 pm, M-F.  Also accepts Medicaid and self-pay.  °[facility] [place] [address], [place] Phone: (336) 878-6027   °[facility] [address], [place], [place] (336)723-1848, Ext. 123 Mondays & Thursdays: 7-9 AM.  First 15 patients are seen on a first come, first serve basis. °  ° °Medicaid-accepting [place] Providers: ° °Organization         Address  Phone   Notes  °[facility] [address], [place] (336) 641-2100 Also  accepts self-pay patients.  °[facility] [address], [place] ° (336) 856-9996   °[facility] [address], [place] (336) 288-8857   °[facility] [address] [place] [address], [place] (336) 299-7000   °Veita Bland [address], [place]  ° (336) 373-1557 Only accepts [place] Access Medicaid patients after they have their name applied to their card.  ° °Self-Pay (no insurance) in [place]: ° °Organization         Address  Phone   Notes  °Sickle Cell Patients, [facility] [address], [place] (336) 832-1970   °[facility] [address], [place] (336) 832-4400   °[facility] ° [address], [place] (336) 992-4800   °[facility]/Dr. Osei-Bonsu ° [address] [place] [address], [place] or [address], [place] (336) 841-8500 Phone number for both [place] and [place] locations is the same.  °[facility] [address], [place] (336) 299-0000   °[facility] [address] [place] [address], [place] or [address] (336) 852-7530 °(336) 878-2260   °[facility] [address], [place] (336) 350-1642, phone; (336) 294-5005, fax Sees patients 1st and 3rd Saturday of every month.  Must not qualify for public or private insurance (i.e. Medicaid, Medicare, [place] Health Choice, Veterans' Benefits) • Household income should be no more than 200% of the poverty level •The clinic cannot treat you if you are pregnant or think you are pregnant • Sexually transmitted diseases are not treated at the clinic.  ° ° °Dental Care: °Organization         Address  Phone  Notes  °[facility] [address], [place] (336) 641-6152 Accepts children up to age 21 who are enrolled in Medicaid or [place] Health Choice; pregnant  women with a Medicaid card; and children who have applied for Medicaid or [place] Health Choice, but were declined, whose parents can pay a reduced fee at time of service.  °[facility] [place]  [address], [place] (336) 641-7733 Accepts children up to age 21 who are enrolled in Medicaid or [place] Health Choice; pregnant women with a Medicaid card; and children who have applied for Medicaid or  Health Choice, but were declined, whose parents can pay a reduced fee at time of service.  °[facility] ° [address], [place] (336) 641-4533 Patients are seen by appointment only. Walk-ins are not accepted. [facility] will see patients 18 years of age and older. °Monday - Tuesday (8am-5pm) °Most Wednesdays (8:30-5pm) °$30 per visit, cash only  °[facility] ° [address], [place] (336) 641-4533 Patients are seen by appointment only. Walk-ins are not accepted. [facility] will see patients 18 years of age and older. °One Wednesday Evening (Monthly: Volunteer Based).  $30 per visit, cash only  °[facility]  (919) 537-3737 for adults; Children under age 4, call Graduate Pediatric Dentistry at (919) [phone]. Children aged 4-14, please call (919) 537-3737 to request a pediatric application. ° Dental services are provided in all areas of dental care including fillings, crowns and bridges, complete and partial dentures, implants, gum treatment, root canals, and extractions. Preventive care is also provided. Treatment is provided to both adults and children. °Patients are selected via a lottery and there is often a waiting list. °  °[facility] [address], °[place] ° (336) 763-8833 [email or web address] °  °[facility] [address], [place], [place] (336)723-1848, Ext. 123 Second and Fourth Thursday of each month, opens at 6:30 AM; Clinic ends at 9 AM.  Patients are  seen on a first-come first-served basis, and a limited number are seen during each clinic.  ° °[facility] ° [address], [place], [place] (336) 723-7904   Eligibility Requirements °You must have lived in [place], [place], or [place] counties for at least the last three months. °  You cannot be eligible for state or federal sponsored healthcare insurance, including Veterans Administration, Medicaid, or Medicare. °  You generally cannot be eligible for healthcare insurance through your employer.  °  How to apply: °Eligibility screenings are held every Tuesday and Wednesday afternoon from 1:00 pm until 4:00 pm. You do not need an appointment for the interview!  °[facility] [address], [place], [place] 336-631-2330   °[facility]  336-342-8273   °[facility]  336-703-3100   °[facility]  336-570-6415   ° °Behavioral Health Resources in the Community: °Intensive Outpatient Programs °Organization         Address  Phone  Notes  °[facility] [address], [place], [place] 336-878-6098   °[facility] [address], [place], [place] 336-832-9800   °[facility] [address], [place], [place] ° 336-882-2125   °[facility] [address],  °[place], [place] 1-800-853-5163 or 336-641-4981   °Substance Abuse Resources °Organization         Address  Phone  Notes  °[facility]  336-882-2125   °[facility]  336-784-9470   °[facility]  336-285-9073   °[facility]  336-845-3988   °Residential & Outpatient Substance Abuse Program  1-800-659-3381   °Psychological Services °Organization         Address  Phone  Notes  °[facility]  336- 832-9600   °[facility]  336- 378-7881   °[facility] [address], [place] 1-800-853-5163 or 336-641-4981   ° °Mobile Crisis  Teams °Organization         Address  Phone  Notes  °[facility], Mobile Crisis Care Unit  1-877-626-1772   °Assertive °[facility] ° [address] [place], [place] 336-834-9664   °Sharon DeEsch [address] °[place] 336-554-5454   ° °Self-Help/Support Groups °Organization         Address  Phone             Notes  °[facility] [place] - variety of support groups  336- 373-1402 Call for more information  °Narcotics Anonymous (NA), [facility] [address], °[place]  2 meetings at this location  ° °Residential Treatment Programs °Organization         Address  Phone  Notes  °[facility] [address],    °[place]  1-866-801-8205   °[facility] ° [address], [place], [place] 704-293-8524   °[facility] [address], [place] 336-845-3988 Admissions: 8am-3pm M-F  °[facility] [address]    °[place], [place] 336-841-1104   °[facility] [address], [place], [place] 336-379-7146   °[facility] [address]  °[place], [place] 336-285-9073   °[facility] Intensive Outpatient [address], [place], [place] 336-852-3033   °[facility] [address]  °[place], [place] 1-877-615-2722 or 336-784-9470   °[facility] [address] [place], [place] 336-227-7417 Accepts Medicaid  °[facility] [address]  °[place] 1-800-659-3381 Substance Abuse/Addiction Treatment  ° °[place] Behavioral Health Resources °Organization         Address  Phone  Notes  °[facility]  (888) 581-9988   °Julie Brannon, PhD [address] [place], [place]   (336) 349-5553 or (336) 951-0000   °[facility]   [address] °[place], [place] (336) 349-4454   °[facility] [address], [place], [place] (336) 342-8316  Insurance/Medicaid/sponsorship through [facility]  °[facility] [address]                                    [place], [place] (336) 342-8316 Therapy/tele-psych/case  °[facility] [address]  ° [place], [place] (336) 349-2233    °Dr. Arfeen  (336) 349-4544   °[facility] [place]  [employer] [place] [facility] 1) [address],  °2) [address], [place] °3)  [address], [place] (336) 349-3220 °(336) 342-7768 ° °(336) 342-8140   °[place] [facility] (336) 342-1394 or (336) 342-3537 (After Hours)    ° ° ° ° °
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
## 2014-04-28 NOTE — ED Notes (Signed)
Pt reports left foot pain and left knee pain x 1 week but no recent injury. Ambulatory at triage.pt was [facility] at [facility] and was sent here for further eval.
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
## 2014-05-29 ENCOUNTER — Ambulatory Visit: Payer: Medicaid Other | Admitting: Internal Medicine

## 2014-06-05 ENCOUNTER — Encounter: Payer: Self-pay | Admitting: Internal Medicine

## 2014-06-05 ENCOUNTER — Ambulatory Visit: Payer: Medicaid Other | Attending: Internal Medicine | Admitting: Internal Medicine

## 2014-06-05 VITALS — BP 126/78 | HR 56 | Temp 98.7°F | Resp 16 | Ht 66.0 in | Wt 257.0 lb

## 2014-06-05 DIAGNOSIS — M79675 Pain in left toe(s): Secondary | ICD-10-CM | POA: Diagnosis not present

## 2014-06-05 DIAGNOSIS — J45909 Unspecified asthma, uncomplicated: Secondary | ICD-10-CM | POA: Insufficient documentation

## 2014-06-05 DIAGNOSIS — Z2821 Immunization not carried out because of patient refusal: Secondary | ICD-10-CM | POA: Diagnosis not present

## 2014-06-05 DIAGNOSIS — I10 Essential (primary) hypertension: Secondary | ICD-10-CM | POA: Insufficient documentation

## 2014-06-05 DIAGNOSIS — E669 Obesity, unspecified: Secondary | ICD-10-CM | POA: Diagnosis not present

## 2014-06-05 DIAGNOSIS — Z1239 Encounter for other screening for malignant neoplasm of breast: Secondary | ICD-10-CM

## 2014-06-05 DIAGNOSIS — F17210 Nicotine dependence, cigarettes, uncomplicated: Secondary | ICD-10-CM | POA: Diagnosis not present

## 2014-06-05 DIAGNOSIS — Z79899 Other long term (current) drug therapy: Secondary | ICD-10-CM | POA: Diagnosis not present

## 2014-06-05 DIAGNOSIS — S99929A Unspecified injury of unspecified foot, initial encounter: Secondary | ICD-10-CM | POA: Diagnosis present

## 2014-06-05 LAB — C-REACTIVE PROTEIN: CRP: <0.5 mg/dL

## 2014-06-05 LAB — URIC ACID: URIC ACID, SERUM: 4.3 mg/dL (ref 2.4–7.0)

## 2014-06-05 MED ORDER — COLCHICINE 0.6 MG PO TABS
0.6000 mg | ORAL_TABLET | Freq: Every day | ORAL | Status: DC
Start: 2014-06-05 — End: 2017-07-17

## 2014-06-05 MED ORDER — INDOMETHACIN 50 MG PO CAPS: 50.0000 mg | ORAL_CAPSULE | Freq: Three times a day (TID) | ORAL | Status: DC
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
## 2014-06-05 NOTE — Patient Instructions (Signed)
Gout Gout is an inflammatory arthritis caused by a buildup of uric acid crystals in the joints. Uric acid is a chemical that is normally present in the blood. When the level of uric acid in the blood is too high it can form crystals that deposit in your joints and tissues. This causes joint redness, soreness, and swelling (inflammation). Repeat attacks are common. Over time, uric acid crystals can form into masses (tophi) near a joint, destroying bone and causing disfigurement. Gout is treatable and often preventable. CAUSES  The disease begins with elevated levels of uric acid in the blood. Uric acid is produced by your body when it breaks down a naturally found substance called purines. Certain foods you eat, such as meats and fish, contain high amounts of purines. Causes of an elevated uric acid level include:  Being passed down from parent to child (heredity).  Diseases that cause increased uric acid production (such as obesity, psoriasis, and certain cancers).  Excessive alcohol use.  Diet, especially diets rich in meat and seafood.  Medicines, including certain cancer-fighting medicines (chemotherapy), water pills (diuretics), and aspirin.  Chronic kidney disease. The kidneys are no longer able to remove uric acid well.  Problems with metabolism. Conditions strongly associated with gout include:  Obesity.  High blood pressure.  High cholesterol.  Diabetes. Not everyone with elevated uric acid levels gets gout. It is not understood why some people get gout and others do not. Surgery, joint injury, and eating too much of certain foods are some of the factors that can lead to gout attacks. SYMPTOMS   An attack of gout comes on quickly. It causes intense pain with redness, swelling, and warmth in a joint.  Fever can occur.  Often, only one joint is involved. Certain joints are more commonly involved:  Base of the big toe.  Knee.  Ankle.  Wrist.  Finger. Without  treatment, an attack usually goes away in a few days to weeks. Between attacks, you usually will not have symptoms, which is different from many other forms of arthritis. DIAGNOSIS  Your caregiver will suspect gout based on your symptoms and exam. In some cases, tests may be recommended. The tests may include:  Blood tests.  Urine tests.  X-rays.  Joint fluid exam. This exam requires a needle to remove fluid from the joint (arthrocentesis). Using a microscope, gout is confirmed when uric acid crystals are seen in the joint fluid. TREATMENT  There are two phases to gout treatment: treating the sudden onset (acute) attack and preventing attacks (prophylaxis).  Treatment of an Acute Attack.  Medicines are used. These include anti-inflammatory medicines or steroid medicines.  An injection of steroid medicine into the affected joint is sometimes necessary.  The painful joint is rested. Movement can worsen the arthritis.  You may use warm or cold treatments on painful joints, depending which works best for you.  Treatment to Prevent Attacks.  If you suffer from frequent gout attacks, your caregiver may advise preventive medicine. These medicines are started after the acute attack subsides. These medicines either help your kidneys eliminate uric acid from your body or decrease your uric acid production. You may need to stay on these medicines for a very long time.  The early phase of treatment with preventive medicine can be associated with an increase in acute gout attacks. For this reason, during the first few months of treatment, your caregiver may also advise you to take medicines usually used for acute gout treatment. Be sure you   understand your caregiver's directions. Your caregiver may make several adjustments to your medicine dose before these medicines are effective.  Discuss dietary treatment with your caregiver or dietitian. Alcohol and drinks high in sugar and fructose and foods  such as meat, poultry, and seafood can increase uric acid levels. Your caregiver or dietitian can advise you on drinks and foods that should be limited. HOME CARE INSTRUCTIONS   Do not take aspirin to relieve pain. This raises uric acid levels.  Only take over-the-counter or prescription medicines for pain, discomfort, or fever as directed by your caregiver.  Rest the joint as much as possible. When in bed, keep sheets and blankets off painful areas.  Keep the affected joint raised (elevated).  Apply warm or cold treatments to painful joints. Use of warm or cold treatments depends on which works best for you.  Use crutches if the painful joint is in your leg.  Drink enough fluids to keep your urine clear or pale yellow. This helps your body get rid of uric acid. Limit alcohol, sugary drinks, and fructose drinks.  Follow your dietary instructions. Pay careful attention to the amount of protein you eat. Your daily diet should emphasize fruits, vegetables, whole grains, and fat-free or low-fat milk products. Discuss the use of coffee, vitamin C, and cherries with your caregiver or dietitian. These may be helpful in lowering uric acid levels.  Maintain a healthy body weight. SEEK MEDICAL CARE IF:   You develop diarrhea, vomiting, or any side effects from medicines.  You do not feel better in 24 hours, or you are getting worse. SEEK IMMEDIATE MEDICAL CARE IF:   Your joint becomes suddenly more tender, and you have chills or a fever. MAKE SURE YOU:   Understand these instructions.  Will watch your condition.  Will get help right away if you are not doing well or get worse. Document Released: 06/06/2000 Document Revised: 10/24/2013 Document Reviewed: 01/21/2012 [facility] Patient Information 2015 [place], [place]. This information is not intended to replace advice given to you by your health care provider. Make sure you discuss any questions you have with your health care provider. Smoking  Cessation Quitting smoking is important to your health and has many advantages. However, it is not always easy to quit since nicotine is a very addictive drug. Oftentimes, people try 3 times or more before being able to quit. This document explains the best ways for you to prepare to quit smoking. Quitting takes hard work and a lot of effort, but you can do it. ADVANTAGES OF QUITTING SMOKING  You will live longer, feel better, and live better.  Your body will feel the impact of quitting smoking almost immediately.  Within 20 minutes, blood pressure decreases. Your pulse returns to its normal level.  After 8 hours, carbon monoxide levels in the blood return to normal. Your oxygen level increases.  After 24 hours, the chance of having a heart attack starts to decrease. Your breath, hair, and body stop smelling like smoke.  After 48 hours, damaged nerve endings begin to recover. Your sense of taste and smell improve.  After 72 hours, the body is virtually free of nicotine. Your bronchial tubes relax and breathing becomes easier.  After 2 to 12 weeks, lungs can hold more air. Exercise becomes easier and circulation improves.  The risk of having a heart attack, stroke, cancer, or lung disease is greatly reduced.  After 1 year, the risk of coronary heart disease is cut in half.  After 5  years, the risk of stroke falls to the same as a nonsmoker.  After 10 years, the risk of lung cancer is cut in half and the risk of other cancers decreases significantly.  After 15 years, the risk of coronary heart disease drops, usually to the level of a nonsmoker.  If you are pregnant, quitting smoking will improve your chances of having a healthy baby.  The people you live with, especially any children, will be healthier.  You will have extra money to spend on things other than cigarettes. QUESTIONS TO THINK ABOUT BEFORE ATTEMPTING TO QUIT You may want to talk about your answers with your health care  provider.  Why do you want to quit?  If you tried to quit in the past, what helped and what did not?  What will be the most difficult situations for you after you quit? How will you plan to handle them?  Who can help you through the tough times? Your family? Friends? A health care provider?  What pleasures do you get from smoking? What ways can you still get pleasure if you quit? Here are some questions to ask your health care provider:  How can you help me to be successful at quitting?  What medicine do you think would be best for me and how should I take it?  What should I do if I need more help?  What is smoking withdrawal like? How can I get information on withdrawal? GET READY  Set a quit date.  Change your environment by getting rid of all cigarettes, ashtrays, matches, and lighters in your home, car, or work. Do not let people smoke in your home.  Review your past attempts to quit. Think about what worked and what did not. GET SUPPORT AND ENCOURAGEMENT You have a better chance of being successful if you have help. You can get support in many ways.  Tell your family, friends, and coworkers that you are going to quit and need their support. Ask them not to smoke around you.  Get individual, group, or telephone counseling and support. Programs are available at [facility] and [facility]. Call your local health department for information about programs in your area.  Spiritual beliefs and practices may help some smokers quit.  Download a "quit meter" on your computer to keep track of quit statistics, such as how long you have gone without smoking, cigarettes not smoked, and money saved.  Get a self-help book about quitting smoking and staying off tobacco. LEARN NEW SKILLS AND BEHAVIORS  Distract yourself from urges to smoke. Talk to someone, go for a walk, or occupy your time with a task.  Change your normal routine. Take a different route to work. Drink tea  instead of coffee. Eat breakfast in a different place.  Reduce your stress. Take a hot bath, exercise, or read a book.  Plan something enjoyable to do every day. Reward yourself for not smoking.  Explore interactive web-based programs that specialize in helping you quit. GET MEDICINE AND USE IT CORRECTLY Medicines can help you stop smoking and decrease the urge to smoke. Combining medicine with the above behavioral methods and support can greatly increase your chances of successfully quitting smoking.  Nicotine replacement therapy helps deliver nicotine to your body without the negative effects and risks of smoking. Nicotine replacement therapy includes nicotine gum, lozenges, inhalers, nasal sprays, and skin patches. Some may be available over-the-counter and others require a prescription.  Antidepressant medicine helps people abstain from smoking,  but how this works is unknown. This medicine is available by prescription.  Nicotinic receptor partial agonist medicine simulates the effect of nicotine in your brain. This medicine is available by prescription. Ask your health care provider for advice about which medicines to use and how to use them based on your health history. Your health care provider will tell you what side effects to look out for if you choose to be on a medicine or therapy. Carefully read the information on the package. Do not use any other product containing nicotine while using a nicotine replacement product.  RELAPSE OR DIFFICULT SITUATIONS Most relapses occur within the first 3 months after quitting. Do not be discouraged if you start smoking again. Remember, most people try several times before finally quitting. You may have symptoms of withdrawal because your body is used to nicotine. You may crave cigarettes, be irritable, feel very hungry, cough often, get headaches, or have difficulty concentrating. The withdrawal symptoms are only temporary. They are strongest when you  first quit, but they will go away within 10-14 days. To reduce the chances of relapse, try to:  Avoid drinking alcohol. Drinking lowers your chances of successfully quitting.  Reduce the amount of caffeine you consume. Once you quit smoking, the amount of caffeine in your body increases and can give you symptoms, such as a rapid heartbeat, sweating, and anxiety.  Avoid smokers because they can make you want to smoke.  Do not let weight gain distract you. Many smokers will gain weight when they quit, usually less than 10 pounds. Eat a healthy diet and stay active. You can always lose the weight gained after you quit.  Find ways to improve your mood other than smoking. FOR MORE INFORMATION  www.smokefree.gov  Document Released: 06/03/2001 Document Revised: 10/24/2013 Document Reviewed: 09/18/2011 [facility] Patient Information 2015 [place]. This information is not intended to replace advice given to you by your health care provider. Make sure you discuss any questions you have with your health care provider.

## 2014-06-06 LAB — SEDIMENTATION RATE: Sed Rate: 3 mm/hr (ref 0–22)

## 2014-06-07 ENCOUNTER — Telehealth: Payer: Self-pay | Admitting: Emergency Medicine

## 2014-06-07 NOTE — Telephone Encounter (Signed)
-----   Message from Ambrose FinlandValerie A Keck, NP sent at 06/06/2014  1:09 PM EST ----- Lab results do not appear to be a acute gout attack. She may still continue the indomethicin to see if she has improvement in symptoms

## 2014-06-07 NOTE — Telephone Encounter (Signed)
Left message with results and further instructions to continue taking Indomethacin for relief

## 2014-06-12 ENCOUNTER — Other Ambulatory Visit (HOSPITAL_COMMUNITY)
Admission: RE | Admit: 2014-06-12 | Discharge: 2014-06-12 | Disposition: A | Discharge status: Home / Self Care | Payer: Medicaid Other | Source: Ambulatory Visit | Attending: Internal Medicine | Admitting: Internal Medicine

## 2014-06-12 ENCOUNTER — Encounter: Payer: Self-pay | Admitting: Internal Medicine

## 2014-06-12 ENCOUNTER — Ambulatory Visit: Payer: Medicaid Other | Attending: Internal Medicine | Admitting: Internal Medicine

## 2014-06-12 VITALS — BP 119/73 | HR 67 | Temp 98.5°F | Resp 18 | Ht 66.0 in | Wt 255.0 lb

## 2014-06-12 DIAGNOSIS — J45909 Unspecified asthma, uncomplicated: Secondary | ICD-10-CM | POA: Diagnosis not present

## 2014-06-12 DIAGNOSIS — Z90711 Acquired absence of uterus with remaining cervical stump: Secondary | ICD-10-CM | POA: Insufficient documentation

## 2014-06-12 DIAGNOSIS — Z01419 Encounter for gynecological examination (general) (routine) without abnormal findings: Secondary | ICD-10-CM | POA: Diagnosis not present

## 2014-06-12 DIAGNOSIS — E669 Obesity, unspecified: Secondary | ICD-10-CM | POA: Diagnosis not present

## 2014-06-12 DIAGNOSIS — Z113 Encounter for screening for infections with a predominantly sexual mode of transmission: Secondary | ICD-10-CM | POA: Diagnosis present

## 2014-06-12 DIAGNOSIS — Z124 Encounter for screening for malignant neoplasm of cervix: Secondary | ICD-10-CM

## 2014-06-12 DIAGNOSIS — Z1239 Encounter for other screening for malignant neoplasm of breast: Secondary | ICD-10-CM | POA: Diagnosis not present

## 2014-06-12 DIAGNOSIS — F17200 Nicotine dependence, unspecified, uncomplicated: Secondary | ICD-10-CM

## 2014-06-12 DIAGNOSIS — I10 Essential (primary) hypertension: Secondary | ICD-10-CM | POA: Insufficient documentation

## 2014-06-12 DIAGNOSIS — N760 Acute vaginitis: Secondary | ICD-10-CM | POA: Diagnosis present

## 2014-06-12 DIAGNOSIS — Z720 Tobacco use: Secondary | ICD-10-CM

## 2014-06-12 DIAGNOSIS — N898 Other specified noninflammatory disorders of vagina: Secondary | ICD-10-CM | POA: Insufficient documentation

## 2014-06-12 NOTE — Progress Notes (Signed)
Patient ID: Stacey Price, female   DOB: 1976-06-08, 38 y.o.   MRN: [id number]  [id number]  [id number]  DOB - 1976-06-08  CC:  Chief Complaint  Patient presents with  . Annual Exam       HPI: Stacey Price is a 38 y.o. female here today to establish medical care.  Patient has past medical history of asthma, hypertension, obesity.  Patient [place] of the date.  She has never had a mammogram.  She had a partial hysterectomy in 2010.  She is unsure if still has her cervix.    Patient has No headache, No chest pain, No abdominal pain - No Nausea, No new weakness tingling or numbness, No Cough - SOB.  No Known Allergies Past Medical History  Diagnosis Date  . Asthma   . Hypertension   . Obesity    Current Outpatient Prescriptions on File Prior to Visit  Medication Sig Dispense Refill  . albuterol (PROVENTIL HFA;VENTOLIN HFA) 108 (90 BASE) MCG/ACT inhaler Inhale 2 puffs into the lungs every 4 (four) hours as needed for wheezing or shortness of breath. 1 Inhaler 12  . amLODipine (NORVASC) 10 MG tablet Take 1 tablet (10 mg total) by mouth daily. 30 tablet 5  . colchicine 0.6 MG tablet Take 1 tablet (0.6 mg total) by mouth daily. Take 2 tables now, and then in one hour take 1 tablet 3 tablet 0  . Fluticasone-Salmeterol (ADVAIR DISKUS) 100-50 MCG/DOSE AEPB Inhale 1 puff into the lungs 2 (two) times daily. 60 each 12  . HYDROcodone-acetaminophen (NORCO/VICODIN) 5-325 MG per tablet 1 to 2 tabs every 4 to 6 hours as needed for pain. 20 tablet 0  . indomethacin (INDOCIN) 50 MG capsule Take 1 capsule (50 mg total) by mouth 3 (three) times daily with meals. 15 capsule 0  . metoCLOPramide (REGLAN) 10 MG tablet 1 every 12 hours as needed for headache 30 tablet 4  . naproxen (NAPROSYN) 500 MG tablet 1 every 12 hours as needed for headache 30 tablet 4  . ondansetron (ZOFRAN) 4 MG tablet Take 1 tablet (4 mg total) by mouth every 8 (eight) hours  as needed for nausea or vomiting. (Patient not taking: Reported on 06/05/2014) 12 tablet 0  . oxyCODONE-acetaminophen (PERCOCET/ROXICET) 5-325 MG per tablet Take 1 tablet by mouth every 4 (four) hours as needed. (Patient not taking: Reported on 06/05/2014) 15 tablet 0  . silver sulfADIAZINE (SILVADENE) 1 % cream Apply 1 application topically daily. (Patient not taking: Reported on 06/05/2014) 50 g 2   No current facility-administered medications on file prior to visit.   Family History  Problem Relation Age of Onset  . Hypertension Mother   . Hypertension Father    History   Social History  . Marital Status: Single    Spouse Name: N/A    Number of Children: N/A  . Years of Education: N/A   Occupational History  . Not on file.   Social History Main Topics  . Smoking status: Current Some Day Smoker  . Smokeless tobacco: Not on file  . Alcohol Use: Yes  . Drug Use: Not on file  . Sexual Activity: Not on file   Other Topics Concern  . Not on file   Social History Narrative    Review of Systems: Constitutional: Negative for fever, chills, diaphoresis, activity change, appetite change and fatigue. HENT: Negative for ear pain, nosebleeds, congestion, facial swelling, rhinorrhea, neck pain, neck stiffness  and ear discharge.  Eyes: Negative for pain, discharge, redness, itching and visual disturbance. Respiratory: Negative for cough, choking, chest tightness, shortness of breath, wheezing and stridor.  Cardiovascular: Negative for chest pain, palpitations and leg swelling. Gastrointestinal: Negative for abdominal distention. Genitourinary: Negative for dysuria, urgency, frequency, hematuria, flank pain, decreased urine volume, difficulty urinating and dyspareunia.  Musculoskeletal: Negative for back pain, joint swelling, arthralgia and gait problem. Neurological: Negative for dizziness, tremors, seizures, syncope, facial asymmetry, speech difficulty, weakness, light-headedness,  numbness and headaches.  Hematological: Negative for adenopathy. Does not bruise/bleed easily. Psychiatric/Behavioral: Negative for hallucinations, behavioral problems, confusion, dysphoric mood, decreased concentration and agitation.    Objective:   Filed Vitals:   06/12/14 1629  BP: 119/73  Pulse: 67  Temp: 98.5 F (36.9 C)  Resp: 18   Physical Exam  Constitutional: She is oriented to person, place, and time.  Cardiovascular: Normal rate, regular rhythm and normal heart sounds.   Pulmonary/Chest: Effort normal and breath sounds normal. Right breast exhibits no mass and no nipple discharge. Left breast exhibits no mass and no nipple discharge.  Abdominal: Soft. Bowel sounds are normal. She exhibits no distension. There is no tenderness.  Genitourinary: No breast tenderness. No tenderness in the vagina. Vaginal discharge (thick white) found.  No cervix  Lymphadenopathy:       Right: No inguinal adenopathy present.       Left: No inguinal adenopathy present.  Neurological: She is alert and oriented to person, place, and time.  Skin: Skin is warm and dry.     No results found for: WBC, HGB, HCT, MCV, PLT No results found for: CREATININE, BUN, NA, K, CL, CO2  No results found for: HGBA1C Lipid Panel  No results found for: CHOL, TRIG, HDL, CHOLHDL, VLDL, LDLCALC     Assessment and plan:   Stacey Landngela was seen today for annual exam.  Diagnoses and associated orders for this visit:  Papanicolaou smear - Cervicovaginal ancillary only - HIV antibody (with reflex) - RPR  Smoking Smoking cessation discussed for 3 minutes, patient is not willing to quit at this time. Will continue to assess on each visit. Discussed increased risk for diseases such as cancer, heart disease, and stroke.     Return if symptoms worsen or fail to improve. [name].     [place] CommonsKECK, Lannie Heaps, NP-C [facility] [phone] 06/12/2014, 4:50 PM

## 2014-06-12 NOTE — Progress Notes (Signed)
Annual physical and pap smear Pt requested lab work test

## 2014-06-13 LAB — RPR

## 2014-06-13 LAB — HIV ANTIBODY (ROUTINE TESTING W REFLEX): HIV: NONREACTIVE

## 2014-06-14 ENCOUNTER — Telehealth: Payer: Self-pay | Admitting: *Deleted

## 2014-06-14 LAB — CERVICOVAGINAL ANCILLARY ONLY
Chlamydia: NEGATIVE
NEISSERIA GONORRHEA: NEGATIVE
WET PREP (BD AFFIRM): NEGATIVE
Wet Prep (BD Affirm): NEGATIVE
Wet Prep (BD Affirm): POSITIVE — AB

## 2014-06-14 NOTE — Telephone Encounter (Signed)
Left voice message with negative labs, if any question return call

## 2014-06-14 NOTE — Telephone Encounter (Signed)
-----   Message from Ambrose FinlandValerie A Keck, NP sent at 06/13/2014  1:22 PM EST ----- HIV/Syphilis negative

## 2014-06-15 ENCOUNTER — Telehealth: Payer: Self-pay

## 2014-06-15 MED ORDER — METRONIDAZOLE 500 MG PO TABS: 500.0000 mg | ORAL_TABLET | Freq: Two times a day (BID) | ORAL | Status: DC

## 2014-06-15 NOTE — Telephone Encounter (Signed)
Stacey Price not available but did speak with her cousin who is authorized  She is aware Zayneb need to pick up medication for her infection Medication sent to [facility] on file

## 2014-06-18 ENCOUNTER — Encounter: Payer: Self-pay | Admitting: Internal Medicine

## 2014-06-19 ENCOUNTER — Ambulatory Visit: Payer: Medicaid Other

## 2014-06-19 ENCOUNTER — Ambulatory Visit
Admission: RE | Admit: 2014-06-19 | Discharge: 2014-06-19 | Disposition: A | Discharge status: Home / Self Care | Payer: Medicaid Other | Source: Ambulatory Visit | Attending: Internal Medicine | Admitting: Internal Medicine

## 2014-06-19 ENCOUNTER — Other Ambulatory Visit: Payer: Self-pay | Admitting: Internal Medicine

## 2014-06-19 DIAGNOSIS — Z1231 Encounter for screening mammogram for malignant neoplasm of breast: Secondary | ICD-10-CM

## 2014-06-27 ENCOUNTER — Telehealth: Payer: Self-pay | Admitting: *Deleted

## 2014-06-27 NOTE — Telephone Encounter (Signed)
Left voice message with normal test If any question return call 

## 2014-06-27 NOTE — Telephone Encounter (Signed)
-----   Message from Ambrose FinlandValerie A Keck, NP sent at 06/22/2014  1:41 PM EST ----- No evidence of malignancy on mammogram. Repeat in one year

## 2014-10-02 ENCOUNTER — Encounter (HOSPITAL_COMMUNITY): Payer: Self-pay | Admitting: Emergency Medicine

## 2014-10-02 ENCOUNTER — Emergency Department (HOSPITAL_COMMUNITY)
Admission: EM | Admit: 2014-10-02 | Discharge: 2014-10-02 | Disposition: A | Discharge status: Home / Self Care | Payer: Medicaid Other | Source: Home / Self Care | Attending: Emergency Medicine | Admitting: Emergency Medicine

## 2014-10-02 DIAGNOSIS — J0140 Acute pansinusitis, unspecified: Secondary | ICD-10-CM | POA: Diagnosis not present

## 2014-10-02 DIAGNOSIS — G43009 Migraine without aura, not intractable, without status migrainosus: Secondary | ICD-10-CM

## 2014-10-02 MED ORDER — KETOROLAC TROMETHAMINE 60 MG/2ML IM SOLN
INTRAMUSCULAR | Status: AC
  Filled 2014-10-02: qty 2

## 2014-10-02 MED ORDER — KETOROLAC TROMETHAMINE 60 MG/2ML IM SOLN
60.0000 mg | Freq: Once | INTRAMUSCULAR | Status: AC
  Administered 2014-10-02: 60 mg via INTRAMUSCULAR

## 2014-10-02 MED ORDER — CETIRIZINE HCL 10 MG PO TABS: 10.0000 mg | ORAL_TABLET | Freq: Every day | ORAL | Status: DC

## 2014-10-02 MED ORDER — AMOXICILLIN-POT CLAVULANATE 875-125 MG PO TABS
1.0000 | ORAL_TABLET | Freq: Two times a day (BID) | ORAL | Status: DC
Start: 2014-10-02 — End: 2015-03-01

## 2014-10-02 NOTE — Discharge Instructions (Signed)
You have a sinus infection. This is contributing to your headache. Take Augmentin 1 pill twice a day for 10 days. Take cetirizine 1 pill daily for the next 2 weeks. Use nasal saline spray as often as you can.  Follow up here or with her PCP as needed.

## 2014-10-02 NOTE — ED Notes (Signed)
Pt. Stated, I've had a headache for 2 weeks. Nothing OTC has worked.

## 2014-10-02 NOTE — ED Provider Notes (Signed)
CSN: [id number]     Arrival date & time 10/02/14  1853 History   First MD Initiated Contact with Patient 10/02/14 2003     Chief Complaint  Patient presents with  . Migraine   (Consider location/radiation/quality/duration/timing/severity/associated sxs/prior Treatment) HPI  She is a 39 year old woman here for evaluation of headache and sinus issues. She states for the last 2-1/2 weeks she has had nasal congestion, sinus pressure, headache. The headache is alternately in her temples in the back of her head. It is throbbing.  It is associated with nausea and vomiting. Occasionally associated with photophobia. She states this is like her typical migraine, just more severe. It is worse with bending forward. No fevers. No difficulty breathing. She does report an intermittent cough.  Past Medical History  Diagnosis Date  . Asthma   . Hypertension   . Obesity    Past Surgical History  Procedure Laterality Date  . Cesarean section      1995-2000  . Abdominal hysterectomy  2009    Parcial    Family History  Problem Relation Age of Onset  . Hypertension Mother   . Hypertension Father    History  Substance Use Topics  . Smoking status: Current Some Day Smoker  . Smokeless tobacco: Not on file  . Alcohol Use: Yes   OB History    No data available     Review of Systems  Constitutional: Negative for fever.  HENT: Positive for congestion and sinus pressure. Negative for rhinorrhea and sore throat.   Eyes: Positive for photophobia.  Respiratory: Positive for cough. Negative for shortness of breath.   Gastrointestinal: Positive for nausea and vomiting.  Neurological: Positive for headaches.    Allergies  Review of patient's allergies indicates no known allergies.  Home Medications   Prior to Admission medications   Medication Sig Start Date End Date Taking? Authorizing Provider  albuterol (PROVENTIL HFA;VENTOLIN HFA) 108 (90 BASE) MCG/ACT inhaler Inhale 2 puffs into the lungs  every 4 (four) hours as needed for wheezing or shortness of breath. 03/20/14   Reuben Likes, MD  amLODipine (NORVASC) 10 MG tablet Take 1 tablet (10 mg total) by mouth daily. 03/20/14   Reuben Likes, MD  amoxicillin-clavulanate (AUGMENTIN) 875-125 MG per tablet Take 1 tablet by mouth 2 (two) times daily. 10/02/14   Charm Rings, MD  cetirizine (ZYRTEC) 10 MG tablet Take 1 tablet (10 mg total) by mouth daily. 10/02/14   Charm Rings, MD  colchicine 0.6 MG tablet Take 1 tablet (0.6 mg total) by mouth daily. Take 2 tables now, and then in one hour take 1 tablet 06/05/14   Ambrose Finland, NP  Fluticasone-Salmeterol (ADVAIR DISKUS) 100-50 MCG/DOSE AEPB Inhale 1 puff into the lungs 2 (two) times daily. 03/20/14   Reuben Likes, MD  HYDROcodone-acetaminophen (NORCO/VICODIN) 5-325 MG per tablet 1 to 2 tabs every 4 to 6 hours as needed for pain. 01/06/14   Reuben Likes, MD  indomethacin (INDOCIN) 50 MG capsule Take 1 capsule (50 mg total) by mouth 3 (three) times daily with meals. 06/05/14   Ambrose Finland, NP  metoCLOPramide (REGLAN) 10 MG tablet 1 every 12 hours as needed for headache 03/20/14   Reuben Likes, MD  metroNIDAZOLE (FLAGYL) 500 MG tablet Take 1 tablet (500 mg total) by mouth 2 (two) times daily. 06/15/14   Quentin Angst, MD  naproxen (NAPROSYN) 500 MG tablet 1 every 12 hours as needed for headache 03/20/14  Reuben Likesavid C Keller, MD  ondansetron (ZOFRAN) 4 MG tablet Take 1 tablet (4 mg total) by mouth every 8 (eight) hours as needed for nausea or vomiting. Patient not taking: Reported on 06/05/2014 03/29/14   Ria ClockJennifer Lee H Presson, PA  oxyCODONE-acetaminophen (PERCOCET/ROXICET) 5-325 MG per tablet Take 1 tablet by mouth every 4 (four) hours as needed. Patient not taking: Reported on 06/05/2014 04/28/14   Garlon HatchetLisa M Sanders, PA-C  silver sulfADIAZINE (SILVADENE) 1 % cream Apply 1 application topically daily. Patient not taking: Reported on 06/05/2014 01/06/14   Reuben Likesavid C Keller, MD   BP 129/82 mmHg   Pulse 76  Temp(Src) 98 F (36.7 C) (Oral)  Resp 18  SpO2 100% Physical Exam  Constitutional: She appears well-developed and well-nourished.  HENT:  Nose: Mucosal edema and rhinorrhea present. Right sinus exhibits maxillary sinus tenderness and frontal sinus tenderness. Left sinus exhibits maxillary sinus tenderness and frontal sinus tenderness.  Mouth/Throat: No oropharyngeal exudate or posterior oropharyngeal erythema.  Neck: Neck supple.  Cardiovascular: Normal rate, regular rhythm and normal heart sounds.   No murmur heard. Pulmonary/Chest: Effort normal and breath sounds normal. No respiratory distress. She has no wheezes. She has no rales.  Lymphadenopathy:    She has no cervical adenopathy.    ED Course  Procedures (including critical care time) Labs Review Labs Reviewed - No data to display  Imaging Review No results found.   MDM   1. Acute pansinusitis, recurrence not specified   2. Migraine without aura and without status migrainosus, not intractable    Toradol 60 mg IM given.  We'll treat sinusitis with Augmentin and Zyrtec. Recommended frequent use of nasal saline spray. Follow-up as needed.    Charm RingsErin J Kip Kautzman, MD 10/02/14 2042

## 2014-10-05 ENCOUNTER — Encounter: Payer: Self-pay | Admitting: Internal Medicine

## 2014-10-05 ENCOUNTER — Ambulatory Visit: Payer: Medicaid Other | Attending: Internal Medicine | Admitting: Internal Medicine

## 2014-10-05 VITALS — BP 119/83 | HR 65 | Temp 98.1°F | Resp 16 | Ht 67.0 in | Wt 261.0 lb

## 2014-10-05 DIAGNOSIS — G43809 Other migraine, not intractable, without status migrainosus: Secondary | ICD-10-CM | POA: Diagnosis not present

## 2014-10-05 MED ORDER — TOPIRAMATE 25 MG PO TABS: 25.0000 mg | ORAL_TABLET | Freq: Two times a day (BID) | ORAL | Status: DC

## 2014-10-05 MED ORDER — RIZATRIPTAN BENZOATE 10 MG PO TABS: 10.0000 mg | ORAL_TABLET | Freq: Three times a day (TID) | ORAL | Status: DC | PRN

## 2014-10-05 MED ORDER — CYCLOBENZAPRINE HCL 5 MG PO TABS: 5.0000 mg | ORAL_TABLET | Freq: Three times a day (TID) | ORAL | Status: DC | PRN
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
## 2014-10-05 NOTE — Progress Notes (Signed)
Pt is following up on her asthma.  Pt is here today c/o a constant headache for 3 weeks.

## 2014-10-05 NOTE — Patient Instructions (Signed)

## 2014-10-09 ENCOUNTER — Telehealth: Payer: Self-pay | Admitting: General Practice

## 2014-10-09 ENCOUNTER — Telehealth: Payer: Self-pay | Admitting: Internal Medicine

## 2014-10-09 NOTE — Telephone Encounter (Signed)
Patient called requesting to speak to nurse, regarding paperwork that was dropped off to able to return to work. Please f/u with pt

## 2014-10-09 NOTE — Telephone Encounter (Signed)
Patient presents to clinic to drop off paperwork for medical clearance to return to work. Patient was just in for OV on 10/05/14.  Patient request to have paperwork faxed to the number at the bottom of the form upon completion. Placed paperwork in providers box.

## 2014-10-10 NOTE — Telephone Encounter (Signed)
Have you completed these papers

## 2014-10-12 NOTE — Telephone Encounter (Signed)
Please call patient and let her know letter is ready.

## 2014-10-12 NOTE — Telephone Encounter (Signed)
Left message informing patient that her paperwork is ready for pick up.

## 2014-11-29 ENCOUNTER — Emergency Department (INDEPENDENT_AMBULATORY_CARE_PROVIDER_SITE_OTHER)
Admission: EM | Admit: 2014-11-29 | Discharge: 2014-11-29 | Disposition: A | Discharge status: Home / Self Care | Payer: Self-pay | Source: Home / Self Care | Attending: Emergency Medicine | Admitting: Emergency Medicine

## 2014-11-29 ENCOUNTER — Encounter (HOSPITAL_COMMUNITY): Payer: Self-pay | Admitting: Emergency Medicine

## 2014-11-29 DIAGNOSIS — B350 Tinea barbae and tinea capitis: Secondary | ICD-10-CM

## 2014-11-29 DIAGNOSIS — R21 Rash and other nonspecific skin eruption: Secondary | ICD-10-CM

## 2014-11-29 MED ORDER — GRISEOFULVIN MICROSIZE 500 MG PO TABS: 500.0000 mg | ORAL_TABLET | Freq: Every day | ORAL | Status: DC

## 2014-11-29 MED ORDER — TRIAMCINOLONE ACETONIDE 0.5 % EX OINT
1.0000 | TOPICAL_OINTMENT | Freq: Two times a day (BID) | CUTANEOUS | Status: DC
Start: 2014-11-29 — End: 2015-03-01

## 2014-11-29 NOTE — ED Notes (Signed)
C/o rash on hairline and back of legs.  Denies any changes in soaps, detergents, and meds.   Pt states that she wears a lot of wigs and wonder if that has contributed to the rash on the hairline.  States the rash on neck is different from the rash on the legs.   No relief with otc meds.

## 2014-11-29 NOTE — ED Provider Notes (Signed)
CSN: [id number]     Arrival date & time 11/29/14  1311 History   First MD Initiated Contact with Patient 11/29/14 1343     Chief Complaint  Patient presents with  . Rash   (Consider location/radiation/quality/duration/timing/severity/associated sxs/prior Treatment) HPI She is a 39 year old woman here for evaluation of rash. She states she has 2 different rashes, one on her legs and one in her hairline.  They both started about 2 weeks ago and have slowly been getting worse. The rash in her hairline is sometimes itchy. She has noticed some scale to it. She has been using over-the-counter clotrimazole cream without improvement. She wears wigs on a daily basis.  She recently purchased a new wig from a new place.  The rash on her legs is very itchy. She has not tried any creams on it.  She denies any new lotions, soaps, detergents, creams. No new medications.  Past Medical History  Diagnosis Date  . Asthma   . Hypertension   . Obesity    Past Surgical History  Procedure Laterality Date  . Cesarean section      1995-2000  . Abdominal hysterectomy  2009    Parcial    Family History  Problem Relation Age of Onset  . Hypertension Mother   . Hypertension Father    History  Substance Use Topics  . Smoking status: Current Some Day Smoker  . Smokeless tobacco: Not on file  . Alcohol Use: Yes   OB History    No data available     Review of Systems As in history of present illness Allergies  Review of patient's allergies indicates no known allergies.  Home Medications   Prior to Admission medications   Medication Sig Start Date End Date Taking? Authorizing Provider  albuterol (PROVENTIL HFA;VENTOLIN HFA) 108 (90 BASE) MCG/ACT inhaler Inhale 2 puffs into the lungs every 4 (four) hours as needed for wheezing or shortness of breath. 03/20/14  Yes Reuben Likesavid C Keller, MD  amLODipine (NORVASC) 10 MG tablet Take 1 tablet (10 mg total) by mouth daily. 03/20/14  Yes Reuben Likesavid C Keller, MD   amoxicillin-clavulanate (AUGMENTIN) 875-125 MG per tablet Take 1 tablet by mouth 2 (two) times daily. 10/02/14   Charm RingsErin J Cidney Kirkwood, MD  cetirizine (ZYRTEC) 10 MG tablet Take 1 tablet (10 mg total) by mouth daily. 10/02/14   Charm RingsErin J Adan Beal, MD  colchicine 0.6 MG tablet Take 1 tablet (0.6 mg total) by mouth daily. Take 2 tables now, and then in one hour take 1 tablet Patient not taking: Reported on 10/05/2014 06/05/14   Ambrose FinlandValerie A Keck, NP  cyclobenzaprine (FLEXERIL) 5 MG tablet Take 1 tablet (5 mg total) by mouth 3 (three) times daily as needed for muscle spasms. 10/05/14   Ambrose FinlandValerie A Keck, NP  Fluticasone-Salmeterol (ADVAIR DISKUS) 100-50 MCG/DOSE AEPB Inhale 1 puff into the lungs 2 (two) times daily. Patient not taking: Reported on 10/05/2014 03/20/14   Reuben Likesavid C Keller, MD  griseofulvin (GRIFULVIN V) 500 MG tablet Take 1 tablet (500 mg total) by mouth daily. 11/29/14   Charm RingsErin J Nala Kachel, MD  indomethacin (INDOCIN) 50 MG capsule Take 1 capsule (50 mg total) by mouth 3 (three) times daily with meals. Patient not taking: Reported on 10/05/2014 06/05/14   Ambrose FinlandValerie A Keck, NP  metoCLOPramide (REGLAN) 10 MG tablet 1 every 12 hours as needed for headache Patient not taking: Reported on 10/05/2014 03/20/14   Reuben Likesavid C Keller, MD  rizatriptan (MAXALT) 10 MG tablet Take 1 tablet (10 mg  total) by mouth every 8 (eight) hours as needed for migraine. May repeat in 2 hours if needed 10/05/14   Ambrose Finland, NP  topiramate (TOPAMAX) 25 MG tablet Take 1 tablet (25 mg total) by mouth 2 (two) times daily. 10/05/14   Ambrose Finland, NP  triamcinolone ointment (KENALOG) 0.5 % Apply 1 application topically 2 (two) times daily. 11/29/14   Charm Rings, MD   BP 144/67 mmHg  Pulse 71  Temp(Src) 98 F (36.7 C) (Oral)  Resp 12  SpO2 100% Physical Exam  Constitutional: She is oriented to person, place, and time.  Cardiovascular: Normal rate.   Pulmonary/Chest: Effort normal.  Neurological: She is alert and oriented to person, place, and time.   Skin:  4 annular lesions in posterior hairline. There is a fine scale at the edges. She has a hyperpigmented papular rash on her bilateral shins. These papules are on dry skin patches.    ED Course  Procedures (including critical care time) Labs Review Labs Reviewed - No data to display  Imaging Review No results found.   MDM   1. Tinea capitis   2. Rash    We'll treat scalp rash with griseofulvin for 6 weeks. Triamcinolone ointment for the leg rash. Follow-up with PCP in 1-2 weeks if no improvement.    Charm Rings, MD 11/29/14 [phone]

## 2014-11-29 NOTE — Discharge Instructions (Signed)
You have a fungal infection on your scalp. Take griseofulvin daily for 6 weeks. Please stop wearing wigs for the next several weeks.  Use the triamcinolone ointment on your legs twice a day for the next 1-2 weeks.  Follow-up with her PCP as needed.
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
## 2015-03-01 ENCOUNTER — Emergency Department (HOSPITAL_COMMUNITY)
Admission: EM | Admit: 2015-03-01 | Discharge: 2015-03-01 | Disposition: A | Discharge status: Home / Self Care | Payer: Medicaid Other | Attending: Emergency Medicine | Admitting: Emergency Medicine

## 2015-03-01 ENCOUNTER — Encounter (HOSPITAL_COMMUNITY): Payer: Self-pay

## 2015-03-01 DIAGNOSIS — Y9289 Other specified places as the place of occurrence of the external cause: Secondary | ICD-10-CM | POA: Insufficient documentation

## 2015-03-01 DIAGNOSIS — Z7952 Long term (current) use of systemic steroids: Secondary | ICD-10-CM | POA: Diagnosis not present

## 2015-03-01 DIAGNOSIS — Y998 Other external cause status: Secondary | ICD-10-CM | POA: Diagnosis not present

## 2015-03-01 DIAGNOSIS — Z792 Long term (current) use of antibiotics: Secondary | ICD-10-CM | POA: Insufficient documentation

## 2015-03-01 DIAGNOSIS — Y9389 Activity, other specified: Secondary | ICD-10-CM | POA: Insufficient documentation

## 2015-03-01 DIAGNOSIS — S40862A Insect bite (nonvenomous) of left upper arm, initial encounter: Secondary | ICD-10-CM | POA: Diagnosis present

## 2015-03-01 DIAGNOSIS — J45909 Unspecified asthma, uncomplicated: Secondary | ICD-10-CM | POA: Diagnosis not present

## 2015-03-01 DIAGNOSIS — I10 Essential (primary) hypertension: Secondary | ICD-10-CM | POA: Insufficient documentation

## 2015-03-01 DIAGNOSIS — Z79899 Other long term (current) drug therapy: Secondary | ICD-10-CM | POA: Insufficient documentation

## 2015-03-01 DIAGNOSIS — W57XXXA Bitten or stung by nonvenomous insect and other nonvenomous arthropods, initial encounter: Secondary | ICD-10-CM | POA: Diagnosis not present

## 2015-03-01 DIAGNOSIS — Z720 Tobacco use: Secondary | ICD-10-CM | POA: Insufficient documentation

## 2015-03-01 DIAGNOSIS — E669 Obesity, unspecified: Secondary | ICD-10-CM | POA: Diagnosis not present

## 2015-03-01 MED ORDER — PREDNISONE 20 MG PO TABS: 40.0000 mg | ORAL_TABLET | Freq: Every day | ORAL | Status: DC

## 2015-03-01 NOTE — Discharge Instructions (Signed)

## 2015-03-01 NOTE — ED Notes (Addendum)
Pt presents with sting to L elbow x 5 days ago.  Pt reports the next day, numbness to L arm and fingers.  Pt also reports where she has been wearing wigs, breaking out to hairline on neck.  Pt reports aching to head, behind R eyes with some dizziness.

## 2015-03-01 NOTE — ED Provider Notes (Signed)
CSN: [id number]     Arrival date & time 03/01/15  1250 History   None    No chief complaint on file.  HPI  Stacey Price is a healthy 39 year old African American lady with history of hypertension who was at a friend's cookout last Thursday and felt a bug bite on her left arm. She didn't see the bug; she only felt the sting and then her symptoms began, which included a diffuse, achy pain from her elbow to her left shoulder, and a shooting pain and numbness from her elbow down to her fingertips.The pain is worse with movement and touch. She denies any fevers, rash, joint pain elsewhere. No chest pain, shortness of breath, palpitations, nausea, or diaphoresis. No leg weakness, slurred speech, vision change, or headache.  Past Medical History  Diagnosis Date  . Asthma   . Hypertension   . Obesity    Past Surgical History  Procedure Laterality Date  . Cesarean section      1995-2000  . Abdominal hysterectomy  2009    Parcial    Family History  Problem Relation Age of Onset  . Hypertension Mother   . Hypertension Father    Social History  Substance Use Topics  . Smoking status: Current Some Day Smoker  . Smokeless tobacco: None  . Alcohol Use: Yes   OB History    No data available     Review of Systems  Constitutional: Negative for fever, chills and activity change.  Eyes: Negative for visual disturbance.  Respiratory: Negative for chest tightness, shortness of breath and wheezing.   Cardiovascular: Negative for chest pain.  Gastrointestinal: Negative for nausea, vomiting, abdominal pain and diarrhea.  Musculoskeletal: Positive for myalgias and joint swelling. Negative for back pain, neck pain and neck stiffness.  Skin: Negative for rash.  Neurological: Negative for dizziness, weakness, light-headedness and headaches.   Allergies  Review of patient's allergies indicates no known allergies.  Home Medications   Prior to Admission medications   Medication Sig Start Date End  Date Taking? Authorizing Provider  albuterol (PROVENTIL HFA;VENTOLIN HFA) 108 (90 BASE) MCG/ACT inhaler Inhale 2 puffs into the lungs every 4 (four) hours as needed for wheezing or shortness of breath. 03/20/14   Reuben Likes, MD  amLODipine (NORVASC) 10 MG tablet Take 1 tablet (10 mg total) by mouth daily. 03/20/14   Reuben Likes, MD  amoxicillin-clavulanate (AUGMENTIN) 875-125 MG per tablet Take 1 tablet by mouth 2 (two) times daily. 10/02/14   Charm Rings, MD  cetirizine (ZYRTEC) 10 MG tablet Take 1 tablet (10 mg total) by mouth daily. 10/02/14   Charm Rings, MD  colchicine 0.6 MG tablet Take 1 tablet (0.6 mg total) by mouth daily. Take 2 tables now, and then in one hour take 1 tablet Patient not taking: Reported on 10/05/2014 06/05/14   Ambrose Finland, NP  cyclobenzaprine (FLEXERIL) 5 MG tablet Take 1 tablet (5 mg total) by mouth 3 (three) times daily as needed for muscle spasms. 10/05/14   Ambrose Finland, NP  Fluticasone-Salmeterol (ADVAIR DISKUS) 100-50 MCG/DOSE AEPB Inhale 1 puff into the lungs 2 (two) times daily. Patient not taking: Reported on 10/05/2014 03/20/14   Reuben Likes, MD  griseofulvin (GRIFULVIN V) 500 MG tablet Take 1 tablet (500 mg total) by mouth daily. 11/29/14   Charm Rings, MD  indomethacin (INDOCIN) 50 MG capsule Take 1 capsule (50 mg total) by mouth 3 (three) times daily with meals. Patient not  taking: Reported on 10/05/2014 06/05/14   Ambrose Finland, NP  metoCLOPramide (REGLAN) 10 MG tablet 1 every 12 hours as needed for headache Patient not taking: Reported on 10/05/2014 03/20/14   Reuben Likes, MD  rizatriptan (MAXALT) 10 MG tablet Take 1 tablet (10 mg total) by mouth every 8 (eight) hours as needed for migraine. May repeat in 2 hours if needed 10/05/14   Ambrose Finland, NP  topiramate (TOPAMAX) 25 MG tablet Take 1 tablet (25 mg total) by mouth 2 (two) times daily. 10/05/14   Ambrose Finland, NP  triamcinolone ointment (KENALOG) 0.5 % Apply 1 application topically 2 (two)  times daily. 11/29/14   Charm Rings, MD   BP 128/83 mmHg  Pulse 76  Temp(Src) 98 F (36.7 C) (Oral)  Resp 20  SpO2 98% Physical Exam  Constitutional: She is oriented to person, place, and time. She appears well-developed and well-nourished.  HENT:  Head: Normocephalic and atraumatic.  Eyes: Conjunctivae and EOM are normal.  Neck: Normal range of motion. Neck supple.  Cardiovascular: Normal rate, regular rhythm and normal heart sounds.   Pulmonary/Chest: Effort normal and breath sounds normal.  Abdominal: Soft.  Musculoskeletal:  Left medial antecubital fossa has a subtle punctum from where she says the bite occurred, without erythema, edema, or effusion on the overlying joint. I do not see a rash on her arm. Sensation is intact to touch bilaterally. Elbow flexion and shoulder abduction range of motion limited due to pain. Grip strength, elbow flexion/extension, and shoulder abduction/adduction 5/5.   Neurological: She is alert and oriented to person, place, and time. She displays normal reflexes. No cranial nerve deficit. She exhibits normal muscle tone. Coordination normal.  Skin: Skin is warm and dry.    ED Course  Procedures (including critical care time)  Labs Review Labs Reviewed - No data to display  Imaging Review No results found. I have personally reviewed and evaluated these images and lab results as part of my medical decision-making.   EKG Interpretation None      MDM   Final diagnoses:  Insect bite of arm, left, initial encounter    Stacey Price is a 39 year old lady presenting with a bug bite from an unknown organism followed by adjacent myalgias and paresthesias; exam was notable for decreased range of motion but there was no rash or effusion. There is no apparent rash, erythema, or induration suggestive of cellulitis, nor is there overlying joint effusion or warmth concerning for septic arthritis. Coronary ischemia was also on the differential but her  symptoms began immediately after the bite and she denies chest pain, diaphoresis, or nausea. Cerebral ischemia was also considered but her symptoms are localized to her arm and her neurologic exam is otherwise non-focal. I suspect this was the result of a bee sting, less likely a spider bite, as there is no skin lesion over the bite. I also suspect there is a large component of anxiety relating to her pain and her limited mobility is from limited motion since the bite. I will prescribe prednisone 40mg  for 5 days and have her follow-up with Dr. Luna Glasgow next week for further evaluation if her pain persists.    Selina Cooley, MD 03/01/15 1435  Selina Cooley, MD 03/01/15 1436  Arby Barrette, MD 03/01/15 [phone]

## 2015-03-08 ENCOUNTER — Ambulatory Visit: Payer: Medicaid Other | Admitting: Internal Medicine

## 2015-03-09 ENCOUNTER — Emergency Department (HOSPITAL_COMMUNITY)
Admission: EM | Admit: 2015-03-09 | Discharge: 2015-03-09 | Disposition: A | Discharge status: Home / Self Care | Payer: Medicaid Other | Source: Home / Self Care | Attending: Family Medicine | Admitting: Family Medicine

## 2015-03-09 ENCOUNTER — Encounter (HOSPITAL_COMMUNITY): Payer: Self-pay | Admitting: *Deleted

## 2015-03-09 ENCOUNTER — Emergency Department (INDEPENDENT_AMBULATORY_CARE_PROVIDER_SITE_OTHER): Payer: Medicaid Other

## 2015-03-09 DIAGNOSIS — J069 Acute upper respiratory infection, unspecified: Secondary | ICD-10-CM | POA: Diagnosis not present

## 2015-03-09 MED ORDER — PSEUDOEPH-BROMPHEN-DM 30-2-10 MG/5ML PO SYRP: 5.0000 mL | ORAL_SOLUTION | Freq: Four times a day (QID) | ORAL | Status: DC | PRN

## 2015-03-09 MED ORDER — IPRATROPIUM BROMIDE 0.06 % NA SOLN: 2.0000 | Freq: Four times a day (QID) | NASAL | Status: DC

## 2015-03-09 NOTE — ED Provider Notes (Signed)
CSN: [id number]     Arrival date & time 03/09/15  1502 History   First MD Initiated Contact with Patient 03/09/15 1616     Chief Complaint  Patient presents with  . URI   (Consider location/radiation/quality/duration/timing/severity/associated sxs/prior Treatment) Patient is a 39 y.o. female presenting with URI. The history is provided by the patient.  URI Presenting symptoms: congestion, cough, fever and rhinorrhea   Severity:  Moderate Onset quality:  Sudden Duration:  2 days Progression:  Unchanged Chronicity:  New Relieved by:  None tried Worsened by:  Nothing tried Ineffective treatments:  None tried Associated symptoms: myalgias   Associated symptoms: no wheezing   Risk factors: sick contacts     Past Medical History  Diagnosis Date  . Asthma   . Hypertension   . Obesity    Past Surgical History  Procedure Laterality Date  . Cesarean section      1995-2000  . Abdominal hysterectomy  2009    Parcial    Family History  Problem Relation Age of Onset  . Hypertension Mother   . Hypertension Father    Social History  Substance Use Topics  . Smoking status: Current Some Day Smoker  . Smokeless tobacco: None  . Alcohol Use: Yes   OB History    No data available     Review of Systems  Constitutional: Positive for fever.  HENT: Positive for congestion, postnasal drip and rhinorrhea.   Respiratory: Positive for cough. Negative for shortness of breath and wheezing.   Cardiovascular: Negative.   Gastrointestinal: Negative.   Genitourinary: Negative.   Musculoskeletal: Positive for myalgias.    Allergies  Review of patient's allergies indicates no known allergies.  Home Medications   Prior to Admission medications   Medication Sig Start Date End Date Taking? Authorizing Provider  acetaminophen (TYLENOL) 500 MG tablet Take 500 mg by mouth every 6 (six) hours as needed for mild pain.    Historical Provider, MD  albuterol (PROVENTIL HFA;VENTOLIN HFA) 108 (90  BASE) MCG/ACT inhaler Inhale 2 puffs into the lungs every 4 (four) hours as needed for wheezing or shortness of breath. 03/20/14   Reuben Likes, MD  amLODipine (NORVASC) 10 MG tablet Take 1 tablet (10 mg total) by mouth daily. Patient not taking: Reported on 03/01/2015 03/20/14   Reuben Likes, MD  brompheniramine-pseudoephedrine-DM 30-2-10 MG/5ML syrup Take 5 mLs by mouth 4 (four) times daily as needed. 03/09/15   Linna Hoff, MD  ipratropium (ATROVENT) 0.06 % nasal spray Place 2 sprays into both nostrils 4 (four) times daily. 03/09/15   Linna Hoff, MD  predniSONE (DELTASONE) 20 MG tablet Take 2 tablets (40 mg total) by mouth daily with breakfast. For 5 days 03/01/15   Selina Cooley, MD  rizatriptan (MAXALT) 10 MG tablet Take 1 tablet (10 mg total) by mouth every 8 (eight) hours as needed for migraine. May repeat in 2 hours if needed 10/05/14   Ambrose Finland, NP   Meds Ordered and Administered this Visit  Medications - No data to display  BP 135/83 mmHg  Pulse 74  Temp(Src) 99.3 F (37.4 C)  Resp 18  SpO2 99% No data found.   Physical Exam  Constitutional: She is oriented to person, place, and time. She appears well-developed and well-nourished.  HENT:  Head: Normocephalic.  Right Ear: External ear normal.  Left Ear: External ear normal.  Mouth/Throat: Oropharynx is clear and moist.  Eyes: Conjunctivae are normal. Pupils are equal, round, and reactive  to light.  Neck: Normal range of motion. Neck supple.  Cardiovascular: Normal heart sounds and intact distal pulses.   Pulmonary/Chest: Effort normal and breath sounds normal.  Lymphadenopathy:    She has no cervical adenopathy.  Neurological: She is alert and oriented to person, place, and time.  Skin: Skin is warm and dry.  Nursing note and vitals reviewed.   ED Course  Procedures (including critical care time)  Labs Review Labs Reviewed - No data to display  Imaging Review Dg Chest 2 View  03/09/2015   CLINICAL DATA:   Fever, chest congestion  EXAM: CHEST  2 VIEW  COMPARISON:  None.  FINDINGS: The heart size and mediastinal contours are within normal limits. Both lungs are clear. The visualized skeletal structures are unremarkable.  IMPRESSION: No active cardiopulmonary disease.   Electronically Signed   By: Natasha Mead M.D.   On: 03/09/2015 16:50   X-rays reviewed and report per radiologist.   Visual Acuity Review  Right Eye Distance:   Left Eye Distance:   Bilateral Distance:    Right Eye Near:   Left Eye Near:    Bilateral Near:         MDM   1. URI (upper respiratory infection)     rx bromfed, atrovent ns   Linna Hoff, MD 03/09/15 1720

## 2015-03-09 NOTE — ED Notes (Signed)
Pt  Reports  Symptoms  Of  Nasal  Congestion   stuffyness     And  Nasal drainage           X  2  Days     Pt  Reports the  Symptoms  X  sev  Days           Pt reports  The    Symptoms  Not  releived  By otc  meds      The  Patient is  Sitting  Upright on the  Exam table  Speaking in   Complete  sentances  And  Is  In no  Acute  Distress

## 2015-03-09 NOTE — Discharge Instructions (Signed)
Drink plenty of fluids as discussed, use medicine as prescribed, and mucinex or delsym for cough. Return or see your doctor if further problems °

## 2015-05-30 ENCOUNTER — Emergency Department (HOSPITAL_COMMUNITY): Payer: Medicaid Other

## 2015-05-30 ENCOUNTER — Encounter (HOSPITAL_COMMUNITY): Payer: Self-pay | Admitting: Emergency Medicine

## 2015-05-30 ENCOUNTER — Emergency Department (HOSPITAL_COMMUNITY)
Admission: EM | Admit: 2015-05-30 | Discharge: 2015-05-30 | Disposition: A | Discharge status: Home / Self Care | Payer: Medicaid Other | Attending: Emergency Medicine | Admitting: Emergency Medicine

## 2015-05-30 DIAGNOSIS — M545 Low back pain: Secondary | ICD-10-CM | POA: Insufficient documentation

## 2015-05-30 DIAGNOSIS — Z79899 Other long term (current) drug therapy: Secondary | ICD-10-CM | POA: Diagnosis not present

## 2015-05-30 DIAGNOSIS — I10 Essential (primary) hypertension: Secondary | ICD-10-CM | POA: Insufficient documentation

## 2015-05-30 DIAGNOSIS — R079 Chest pain, unspecified: Secondary | ICD-10-CM

## 2015-05-30 DIAGNOSIS — F17200 Nicotine dependence, unspecified, uncomplicated: Secondary | ICD-10-CM | POA: Diagnosis not present

## 2015-05-30 DIAGNOSIS — J45901 Unspecified asthma with (acute) exacerbation: Secondary | ICD-10-CM | POA: Diagnosis not present

## 2015-05-30 DIAGNOSIS — E669 Obesity, unspecified: Secondary | ICD-10-CM | POA: Insufficient documentation

## 2015-05-30 LAB — I-STAT TROPONIN, ED
TROPONIN I, POC: 0 ng/mL (ref 0.00–0.08)
TROPONIN I, POC: 0.01 ng/mL (ref 0.00–0.08)

## 2015-05-30 LAB — CBC
HCT: 36.1 % (ref 36.0–46.0)
Hemoglobin: 12.5 g/dL (ref 12.0–15.0)
MCH: 30.3 pg (ref 26.0–34.0)
MCHC: 34.6 g/dL (ref 30.0–36.0)
MCV: 87.4 fL (ref 78.0–100.0)
PLATELETS: 212 10*3/uL (ref 150–400)
RBC: 4.13 MIL/uL (ref 3.87–5.11)
RDW: 13 % (ref 11.5–15.5)
WBC: 5.3 10*3/uL (ref 4.0–10.5)

## 2015-05-30 LAB — BASIC METABOLIC PANEL
Anion gap: 7 (ref 5–15)
BUN: 8 mg/dL (ref 6–20)
CALCIUM: 8.7 mg/dL — AB (ref 8.9–10.3)
CHLORIDE: 110 mmol/L (ref 101–111)
CO2: 24 mmol/L (ref 22–32)
CREATININE: 0.8 mg/dL (ref 0.44–1.00)
Glucose, Bld: 89 mg/dL (ref 65–99)
Potassium: 3.3 mmol/L — ABNORMAL LOW (ref 3.5–5.1)
SODIUM: 141 mmol/L (ref 135–145)

## 2015-05-30 LAB — D-DIMER, QUANTITATIVE (NOT AT ARMC): D DIMER QUANT: 0.37 ug{FEU}/mL (ref 0.00–0.50)

## 2015-05-30 NOTE — ED Provider Notes (Signed)
CSN: [id number]     Arrival date & time 05/30/15  1528 History   First MD Initiated Contact with Patient 05/30/15 1958     Chief Complaint  Patient presents with  . Chest Pain     (Consider location/radiation/quality/duration/timing/severity/associated sxs/prior Treatment) HPI Comments: Patient presents with complaint of chest pain described as a sharp pain in her left chest with radiation to her left scapula and left shoulder constantly over the past 2 days, worsening prior to arrival today. She describes associated shortness of breath that is worse with activity. She has not had any fever, however has had cough. She denies wheezing but does have history of asthma. No abdominal pain, nausea, vomiting, or diarrhea. Pain is worse when she takes a deep breath in or moves her left arm. Patient had similar symptoms this summer, states that she had an EKG performed and no other workup. Patient denies risk factors for pulmonary embolism including: unilateral leg swelling, history of DVT/PE/other blood clots, use of estrogens, recent immobilizations, recent surgery, recent travel (>4hr segment), malignancy, hemoptysis. She has taken naproxen without relief of symptoms. She is currently on Norvasc for high blood pressure. Symptoms are not changed with food. No family history of heart attacks or CAD at a young age.    Patient is a 39 y.o. female presenting with chest pain. The history is provided by the patient.  Chest Pain Associated symptoms: back pain, cough and shortness of breath   Associated symptoms: no abdominal pain, no diaphoresis, no fever, no nausea, no palpitations and not vomiting     Past Medical History  Diagnosis Date  . Asthma   . Hypertension   . Obesity    Past Surgical History  Procedure Laterality Date  . Cesarean section      1995-2000  . Abdominal hysterectomy  2009    Parcial    Family History  Problem Relation Age of Onset  . Hypertension Mother   . Hypertension  Father    Social History  Substance Use Topics  . Smoking status: Current Some Day Smoker  . Smokeless tobacco: None  . Alcohol Use: Yes   OB History    No data available     Review of Systems  Constitutional: Negative for fever and diaphoresis.  Eyes: Negative for redness.  Respiratory: Positive for cough and shortness of breath. Negative for wheezing.   Cardiovascular: Positive for chest pain. Negative for palpitations and leg swelling.  Gastrointestinal: Negative for nausea, vomiting and abdominal pain.  Genitourinary: Negative for dysuria.  Musculoskeletal: Positive for back pain. Negative for neck pain.  Skin: Negative for rash.  Neurological: Negative for syncope and light-headedness.  Psychiatric/Behavioral: The patient is not nervous/anxious.     Allergies  Review of patient's allergies indicates no known allergies.  Home Medications   Prior to Admission medications   Medication Sig Start Date End Date Taking? Authorizing Provider  acetaminophen (TYLENOL) 500 MG tablet Take 500 mg by mouth every 6 (six) hours as needed for mild pain.    Historical Provider, MD  albuterol (PROVENTIL HFA;VENTOLIN HFA) 108 (90 BASE) MCG/ACT inhaler Inhale 2 puffs into the lungs every 4 (four) hours as needed for wheezing or shortness of breath. 03/20/14   Reuben Likes, MD  amLODipine (NORVASC) 10 MG tablet Take 1 tablet (10 mg total) by mouth daily. Patient not taking: Reported on 03/01/2015 03/20/14   Reuben Likes, MD  brompheniramine-pseudoephedrine-DM 30-2-10 MG/5ML syrup Take 5 mLs by mouth 4 (four) times daily  as needed. 03/09/15   Linna Hoff, MD  ipratropium (ATROVENT) 0.06 % nasal spray Place 2 sprays into both nostrils 4 (four) times daily. 03/09/15   Linna Hoff, MD  predniSONE (DELTASONE) 20 MG tablet Take 2 tablets (40 mg total) by mouth daily with breakfast. For 5 days 03/01/15   Selina Cooley, MD  rizatriptan (MAXALT) 10 MG tablet Take 1 tablet (10 mg total) by mouth every 8  (eight) hours as needed for migraine. May repeat in 2 hours if needed 10/05/14   Ambrose Finland, NP   BP 136/77 mmHg  Pulse 60  Temp(Src) 98.1 F (36.7 C) (Oral)  Resp 16  Ht  (1.651 m)  Wt 118.389 kg  BMI 43.43 kg/m2  SpO2 100%   Physical Exam  Constitutional: She appears well-developed and well-nourished.  HENT:  Head: Normocephalic and atraumatic.  Mouth/Throat: Oropharynx is clear and moist and mucous membranes are normal. Mucous membranes are not dry.  Eyes: Conjunctivae are normal.  Neck: Trachea normal and normal range of motion. Neck supple. Normal carotid pulses and no JVD present. No muscular tenderness present. Carotid bruit is not present. No tracheal deviation present.  Cardiovascular: Normal rate, regular rhythm, S1 normal, S2 normal, normal heart sounds and intact distal pulses.  Exam reveals no decreased pulses.   No murmur heard. Pulmonary/Chest: Effort normal. No respiratory distress. She has no wheezes. She exhibits no tenderness.  Abdominal: Soft. Normal aorta and bowel sounds are normal. There is no tenderness. There is no rebound and no guarding.  Musculoskeletal: Normal range of motion.  Neurological: She is alert.  Skin: Skin is warm and dry. She is not diaphoretic. No cyanosis. No pallor.  Psychiatric: She has a normal mood and affect.  Nursing note and vitals reviewed.   ED Course  Procedures (including critical care time) Labs Review Labs Reviewed  BASIC METABOLIC PANEL - Abnormal; Notable for the following:    Potassium 3.3 (*)    Calcium 8.7 (*)    All other components within normal limits  CBC  D-DIMER, QUANTITATIVE (NOT AT [facility])  Rosezena Sensor, ED  Rosezena Sensor, ED    Imaging Review Dg Chest 2 View  05/30/2015  CLINICAL DATA:  Chest pain.  Asthma.  Hypertension. EXAM: CHEST  2 VIEW COMPARISON:  03/09/2015 FINDINGS: The heart size and mediastinal contours are within normal limits. Both lungs are clear. The visualized skeletal  structures are unremarkable. IMPRESSION: No active cardiopulmonary disease. Electronically Signed   By: Myles Rosenthal M.D.   On: 05/30/2015 17:04   I have personally reviewed and evaluated these images and lab results as part of my medical decision-making.   EKG Interpretation   Date/Time:  Wednesday May 30 2015 15:34:32 EST Ventricular Rate:  57 PR Interval:  148 QRS Duration: 78 QT Interval:  454 QTC Calculation: 441 R Axis:   72 Text Interpretation:  Sinus bradycardia Otherwise normal ECG No old  tracing to compare Confirmed by [facility]  MD, DAVID ([id number]) on 05/30/2015  7:57:04 PM       7:58 PM Patient seen and examined. D-dimer ordered due to description and that patient is low risk. Discussed need for CT if this is positive. Will check delta troponin to complete cardiac evaluation. If these are neg, patient will need to f/u with PCP.    Vital signs reviewed and are as follows: BP 136/77 mmHg  Pulse 60  Temp(Src) 98.1 F (36.7 C) (Oral)  Resp 16  Ht  (1.651  m)  Wt 118.389 kg  BMI 43.43 kg/m2  SpO2 100%  10:05 PM d-dimer and repeat troponin are negative. Patient updated. Exam is stable. Encouraged PCP follow-up in the next 3 days for recheck of symptoms and further evaluation.  Patient was counseled to return with severe chest pain, especially if the pain is crushing or pressure-like and spreads to the arms, back, neck, or jaw, or if they have sweating, nausea, or shortness of breath with the pain. They were encouraged to call 911 with these symptoms.   They were also told to return if their chest pain gets worse and does not go away with rest, they have an attack of chest pain lasting longer than usual despite rest and treatment with the medications their caregiver has prescribed, if they wake from sleep with chest pain or shortness of breath, if they feel dizzy or faint, if they have chest pain not typical of their usual pain, or if they have any other emergent concerns  regarding their health.  The patient verbalized understanding and agreed.     MDM   Final diagnoses:  Chest pain, unspecified chest pain type   Patient with 2 days of chest pain as above. Cardiac workup here shows EKG without signs of ischemia, negative troponin. Given reported shortness of breath, d-dimer was checked and was normal range. Remainder of workup was unremarkable. Vital signs are stable. Patient is very low risk and do not feel that admission is necessary at this time. She has PCP with whom she can follow-up. Return instructions discussed as above.    Renne CriglerJoshua Ruthanne Mcneish, PA-C 05/30/15 2207  Dione Boozeavid Glick, MD 05/30/15 [phone]

## 2015-05-30 NOTE — Discharge Instructions (Signed)
Please read and follow all provided instructions.  Your diagnoses today include:  1. Chest pain, unspecified chest pain type     Tests performed today include:  An EKG of your heart  A chest x-ray  Cardiac enzymes - a blood test for heart muscle damage  D-dimer - screening test for blood clot was negative  Blood counts and electrolytes  Vital signs. See below for your results today.   Medications prescribed:   None  Take any prescribed medications only as directed.  Follow-up instructions: Please follow-up with your primary care provider as soon as you can for further evaluation of your symptoms.   Return instructions:  SEEK IMMEDIATE MEDICAL ATTENTION IF:  You have severe chest pain, especially if the pain is crushing or pressure-like and spreads to the arms, back, neck, or jaw, or if you have sweating, nausea (feeling sick to your stomach), or shortness of breath. THIS IS AN EMERGENCY. Don't wait to see if the pain will go away. Get medical help at once. Call 911 or [phone] (operator). DO NOT drive yourself to the hospital.   Your chest pain gets worse and does not go away with rest.   You have an attack of chest pain lasting longer than usual, despite rest and treatment with the medications your caregiver has prescribed.   You wake from sleep with chest pain or shortness of breath.  You feel dizzy or faint.  You have chest pain not typical of your usual pain for which you originally saw your caregiver.   You have any other emergent concerns regarding your health.  Additional Information: Chest pain comes from many different causes. Your caregiver has diagnosed you as having chest pain that is not specific for one problem, but does not require admission.  You are at low risk for an acute heart condition or other serious illness.   Your vital signs today were: BP 121/94 mmHg   Pulse 55   Temp(Src) 98.1 F (36.7 C) (Oral)   Resp 20   Ht 5\' 5"  (1.651 m)   Wt 118.389 kg    BMI 43.43 kg/m2   SpO2 100% If your blood pressure (BP) was elevated above 135/85 this visit, please have this repeated by your doctor within one month. --------------

## 2015-05-30 NOTE — ED Notes (Signed)
Pt states she has had a pressure in her chest for 2 days now that started while she was just sitting at work. Pain has been constant and pt also feeling SOB. Last night felt nauseous but no vomiting.

## 2015-05-30 NOTE — ED Notes (Signed)
Pt left with all her belongings and ambulated out of the treatment area.  

## 2015-07-11 ENCOUNTER — Encounter (HOSPITAL_COMMUNITY): Payer: Self-pay | Admitting: Emergency Medicine

## 2015-07-11 ENCOUNTER — Emergency Department (HOSPITAL_COMMUNITY)
Admission: EM | Admit: 2015-07-11 | Discharge: 2015-07-11 | Disposition: A | Discharge status: Home / Self Care | Payer: Medicaid Other | Source: Home / Self Care | Attending: Emergency Medicine | Admitting: Emergency Medicine

## 2015-07-11 DIAGNOSIS — R6889 Other general symptoms and signs: Secondary | ICD-10-CM

## 2015-07-11 MED ORDER — HYDROCODONE-HOMATROPINE 5-1.5 MG/5ML PO SYRP: 5.0000 mL | ORAL_SOLUTION | Freq: Four times a day (QID) | ORAL | Status: DC | PRN

## 2015-07-11 MED ORDER — ONDANSETRON HCL 4 MG PO TABS: 4.0000 mg | ORAL_TABLET | Freq: Three times a day (TID) | ORAL | Status: DC | PRN

## 2015-07-11 NOTE — ED Notes (Signed)
The patient presented to the [facility] with a complaint of chills, cough and nasal congestion x 2 days.

## 2015-07-11 NOTE — Discharge Instructions (Signed)
You likely have been removed. Get plenty of rest and drink lots of fluids. You can continue the Alka-Seltzer cold and flu as needed for fevers and body aches. Use the Zofran every 8 hours as needed for nausea. Use the Hycodan every 4 hours as needed for cough. Do not drive while taking this medicine. Fevers typically last 3-5 days, and then another week to recover. If your symptoms change or worsen, please come back.

## 2015-07-11 NOTE — ED Provider Notes (Signed)
CSN: [id number]     Arrival date & time 07/11/15  1725 History   First MD Initiated Contact with Patient 07/11/15 1829     Chief Complaint  Patient presents with  . Chills  . Cough  . Nasal Congestion   (Consider location/radiation/quality/duration/timing/severity/associated sxs/prior Treatment) HPI  She is a 40 year old woman here for evaluation of cough. She states her symptoms started 2 days ago with nasal congestion, postnasal drainage, cough, body aches. She reports subjective fever this morning with chills. She has had some nausea with one episode of vomiting today. She also describes one loose stool today. Her appetite is decreased. She also reports fatigue. She has been around other people with similar symptoms. She did not get her flu shot this year.  She has been taking Alka-Seltzer cold and flu.  Past Medical History  Diagnosis Date  . Asthma   . Hypertension   . Obesity    Past Surgical History  Procedure Laterality Date  . Cesarean section      1995-2000  . Abdominal hysterectomy  2009    Parcial    Family History  Problem Relation Age of Onset  . Hypertension Mother   . Hypertension Father    Social History  Substance Use Topics  . Smoking status: Current Some Day Smoker  . Smokeless tobacco: None  . Alcohol Use: Yes   OB History    No data available     Review of Systems As in history of present illness Allergies  Review of patient's allergies indicates no known allergies.  Home Medications   Prior to Admission medications   Medication Sig Start Date End Date Taking? Authorizing Provider  albuterol (PROVENTIL HFA;VENTOLIN HFA) 108 (90 BASE) MCG/ACT inhaler Inhale 2 puffs into the lungs every 4 (four) hours as needed for wheezing or shortness of breath. 03/20/14   Reuben Likes, MD  amLODipine (NORVASC) 10 MG tablet Take 1 tablet (10 mg total) by mouth daily. 03/20/14   Reuben Likes, MD  brompheniramine-pseudoephedrine-DM 30-2-10 MG/5ML syrup Take 5  mLs by mouth 4 (four) times daily as needed. 03/09/15   Linna Hoff, MD  HYDROcodone-homatropine [facility]) 5-1.5 MG/5ML syrup Take 5 mLs by mouth every 6 (six) hours as needed for cough. 07/11/15   Charm Rings, MD  ipratropium (ATROVENT) 0.06 % nasal spray Place 2 sprays into both nostrils 4 (four) times daily. 03/09/15   Linna Hoff, MD  naproxen sodium (ANAPROX) 220 MG tablet Take 220 mg by mouth 2 (two) times daily as needed (for pain).    Historical Provider, MD  ondansetron (ZOFRAN) 4 MG tablet Take 1 tablet (4 mg total) by mouth every 8 (eight) hours as needed for nausea or vomiting. 07/11/15   Charm Rings, MD  predniSONE (DELTASONE) 20 MG tablet Take 2 tablets (40 mg total) by mouth daily with breakfast. For 5 days 03/01/15   Selina Cooley, MD  rizatriptan (MAXALT) 10 MG tablet Take 1 tablet (10 mg total) by mouth every 8 (eight) hours as needed for migraine. May repeat in 2 hours if needed 10/05/14   Ambrose Finland, NP   Meds Ordered and Administered this Visit  Medications - No data to display  BP 113/72 mmHg  Pulse 83  Temp(Src) 98.4 F (36.9 C) (Oral)  SpO2 100% No data found.   Physical Exam  Constitutional: She is oriented to person, place, and time. She appears well-developed and well-nourished. No distress.  HENT:  Mouth/Throat: No oropharyngeal exudate.  Clear postnasal drainage. No erythema. TMs normal bilaterally. Nasal mucosa normal.  Neck: Neck supple.  Cardiovascular: Normal rate, regular rhythm and normal heart sounds.   No murmur heard. Pulmonary/Chest: Effort normal and breath sounds normal. No respiratory distress. She has no wheezes. She has no rales.  Lymphadenopathy:    She has cervical adenopathy.  Neurological: She is alert and oriented to person, place, and time.    ED Course  Procedures (including critical care time)  Labs Review Labs Reviewed - No data to display  Imaging Review No results found.    MDM   1. Flu-like symptoms    Discussed  symptomatic treatment and expected time course. Zofran as needed for nausea. Hycodan as needed for cough. Work note provided. Follow-up as needed.    Charm Rings, MD 07/11/15 [phone]

## 2015-07-20 ENCOUNTER — Ambulatory Visit: Payer: Medicaid Other | Admitting: Internal Medicine

## 2015-07-23 NOTE — ED Notes (Signed)
Pt calling today to have her Rx for Hycodan changed to a medicine covered by her insurance.  Pt was instructed that since it has been 12 days, she will need to be evaluated again for her continued cough.  Dr. Piedad Climes was not on staff today, who say her initially.

## 2015-08-09 ENCOUNTER — Ambulatory Visit: Payer: Medicaid Other | Admitting: Internal Medicine

## 2015-08-14 ENCOUNTER — Other Ambulatory Visit: Payer: Self-pay

## 2015-08-14 DIAGNOSIS — Z1231 Encounter for screening mammogram for malignant neoplasm of breast: Secondary | ICD-10-CM

## 2015-08-28 ENCOUNTER — Inpatient Hospital Stay: Admission: RE | Admit: 2015-08-28 | Payer: Medicaid Other | Source: Ambulatory Visit

## 2015-08-29 ENCOUNTER — Other Ambulatory Visit: Payer: Self-pay

## 2015-08-29 DIAGNOSIS — Z1231 Encounter for screening mammogram for malignant neoplasm of breast: Secondary | ICD-10-CM

## 2015-09-10 ENCOUNTER — Ambulatory Visit
Admission: RE | Admit: 2015-09-10 | Discharge: 2015-09-10 | Disposition: A | Discharge status: Home / Self Care | Payer: Medicaid Other | Source: Ambulatory Visit

## 2015-09-10 DIAGNOSIS — Z1231 Encounter for screening mammogram for malignant neoplasm of breast: Secondary | ICD-10-CM

## 2015-09-12 ENCOUNTER — Other Ambulatory Visit: Payer: Self-pay | Admitting: Internal Medicine

## 2015-09-12 DIAGNOSIS — R928 Other abnormal and inconclusive findings on diagnostic imaging of breast: Secondary | ICD-10-CM

## 2015-09-18 ENCOUNTER — Ambulatory Visit
Admission: RE | Admit: 2015-09-18 | Discharge: 2015-09-18 | Disposition: A | Discharge status: Home / Self Care | Payer: Medicaid Other | Source: Ambulatory Visit | Attending: Internal Medicine | Admitting: Internal Medicine

## 2015-09-18 DIAGNOSIS — R928 Other abnormal and inconclusive findings on diagnostic imaging of breast: Secondary | ICD-10-CM

## 2015-10-12 ENCOUNTER — Encounter: Payer: Self-pay | Admitting: Clinical

## 2015-10-12 ENCOUNTER — Encounter: Payer: Self-pay | Admitting: Family Medicine

## 2015-10-12 ENCOUNTER — Ambulatory Visit: Payer: Medicaid Other | Attending: Family Medicine | Admitting: Family Medicine

## 2015-10-12 VITALS — BP 128/86 | HR 66 | Temp 98.9°F | Resp 16 | Ht 66.0 in | Wt 280.0 lb

## 2015-10-12 DIAGNOSIS — J45998 Other asthma: Secondary | ICD-10-CM | POA: Insufficient documentation

## 2015-10-12 DIAGNOSIS — N6310 Unspecified lump in the right breast, unspecified quadrant: Secondary | ICD-10-CM

## 2015-10-12 DIAGNOSIS — I10 Essential (primary) hypertension: Secondary | ICD-10-CM | POA: Diagnosis not present

## 2015-10-12 DIAGNOSIS — J4520 Mild intermittent asthma, uncomplicated: Secondary | ICD-10-CM

## 2015-10-12 DIAGNOSIS — N63 Unspecified lump in breast: Secondary | ICD-10-CM | POA: Diagnosis not present

## 2015-10-12 DIAGNOSIS — L509 Urticaria, unspecified: Secondary | ICD-10-CM | POA: Insufficient documentation

## 2015-10-12 DIAGNOSIS — B359 Dermatophytosis, unspecified: Secondary | ICD-10-CM | POA: Diagnosis not present

## 2015-10-12 DIAGNOSIS — N644 Mastodynia: Secondary | ICD-10-CM | POA: Insufficient documentation

## 2015-10-12 DIAGNOSIS — B360 Pityriasis versicolor: Secondary | ICD-10-CM

## 2015-10-12 DIAGNOSIS — Z79899 Other long term (current) drug therapy: Secondary | ICD-10-CM | POA: Diagnosis not present

## 2015-10-12 DIAGNOSIS — L309 Dermatitis, unspecified: Secondary | ICD-10-CM

## 2015-10-12 DIAGNOSIS — F17210 Nicotine dependence, cigarettes, uncomplicated: Secondary | ICD-10-CM | POA: Diagnosis not present

## 2015-10-12 DIAGNOSIS — B354 Tinea corporis: Secondary | ICD-10-CM | POA: Insufficient documentation

## 2015-10-12 DIAGNOSIS — J45909 Unspecified asthma, uncomplicated: Secondary | ICD-10-CM | POA: Insufficient documentation

## 2015-10-12 MED ORDER — KETOCONAZOLE 2 % EX CREA: 1.0000 "application " | TOPICAL_CREAM | Freq: Every day | CUTANEOUS | Status: DC

## 2015-10-12 MED ORDER — TRIAMCINOLONE ACETONIDE 0.025 % EX CREA: 1.0000 "application " | TOPICAL_CREAM | Freq: Two times a day (BID) | CUTANEOUS | Status: DC

## 2015-10-12 MED ORDER — ALBUTEROL SULFATE HFA 108 (90 BASE) MCG/ACT IN AERS: 2.0000 | INHALATION_SPRAY | RESPIRATORY_TRACT | Status: DC | PRN

## 2015-10-12 MED ORDER — AMLODIPINE BESYLATE 10 MG PO TABS: 10.0000 mg | ORAL_TABLET | Freq: Every day | ORAL | Status: DC

## 2015-10-12 NOTE — Progress Notes (Signed)
Subjective:  Patient ID: Stacey Price Pischke, female    DOB: 28-Jun-1975  Age: 40 y.o. MRN: [id number]  CC: Breast Problem   HPI Stacey Price Heiberger presents for   1. L breast discharge: noted 3 weeks ago for 2.5 weeks. Last seen 4 days ago. White discharge from areola near nipple. Painless. Non-bloody. Now there is an area of scaly skin that is pruritic. She has hx of dermatis and uses kenalog cream. She has hx of R breast mass she had  Diagnostic mammogram and US last month. The exam favors a benign mass she is scheduled for f/u mammogram and US 6 months from last.   Social History  Substance Use Topics  . Smoking status: Current Some Day Smoker  . Smokeless tobacco: Not on file  . Alcohol Use: Yes    Outpatient Prescriptions Prior to Visit  Medication Sig Dispense Refill  . albuterol (PROVENTIL HFA;VENTOLIN HFA) 108 (90 BASE) MCG/ACT inhaler Inhale 2 puffs into the lungs every 4 (four) hours as needed for wheezing or shortness of breath. 1 Inhaler 12  . amLODipine (NORVASC) 10 MG tablet Take 1 tablet (10 mg total) by mouth daily. 30 tablet 5  . brompheniramine-pseudoephedrine-DM 30-2-10 MG/5ML syrup Take 5 mLs by mouth 4 (four) times daily as needed. 120 mL 0  . HYDROcodone-homatropine (HYCODAN) 5-1.5 MG/5ML syrup Take 5 mLs by mouth every 6 (six) hours as needed for cough. 120 mL 0  . ipratropium (ATROVENT) 0.06 % nasal spray Place 2 sprays into both nostrils 4 (four) times daily. 15 mL 1  . naproxen sodium (ANAPROX) 220 MG tablet Take 220 mg by mouth 2 (two) times daily as needed (for pain).    . ondansetron (ZOFRAN) 4 MG tablet Take 1 tablet (4 mg total) by mouth every 8 (eight) hours as needed for nausea or vomiting. 20 tablet 0  . predniSONE (DELTASONE) 20 MG tablet Take 2 tablets (40 mg total) by mouth daily with breakfast. For 5 days 10 tablet 0  . rizatriptan (MAXALT) 10 MG tablet Take 1 tablet (10 mg total) by mouth every 8 (eight) hours as needed for migraine. May repeat in 2 hours if  needed 10 tablet 0   No facility-administered medications prior to visit.    ROS Review of Systems  Constitutional: Negative for fever and chills.  Eyes: Negative for visual disturbance.  Respiratory: Negative for shortness of breath.   Cardiovascular: Negative for chest pain.  Gastrointestinal: Negative for abdominal pain and blood in stool.  Musculoskeletal: Negative for back pain and arthralgias.  Skin: Positive for rash.  Allergic/Immunologic: Negative for immunocompromised state.  Hematological: Negative for adenopathy. Does not bruise/bleed easily.  Psychiatric/Behavioral: Negative for suicidal ideas and dysphoric mood.    Objective:  BP 128/86 mmHg  Pulse 66  Temp(Src) 98.9 F (37.2 C) (Oral)  Resp 16  Ht 5\' 6"  (1.676 m)  Wt 280 lb (127.007 kg)  BMI 45.21 kg/m2  SpO2 100%  BP/Weight 10/12/2015 07/11/2015 05/30/2015  Systolic BP 128 113 143  Diastolic BP 86 72 94  Wt. (Lbs) 280 - 261  BMI 45.21 - 43.43   Physical Exam  Constitutional: She is oriented to person, place, and time. She appears well-developed and well-nourished. No distress.  HENT:  Head: Normocephalic and atraumatic.  Cardiovascular: Normal rate, regular rhythm, normal heart sounds and intact distal pulses.   Pulmonary/Chest: Effort normal and breath sounds normal. Right breast exhibits no inverted nipple, no mass, no nipple discharge, no skin change and no tenderness. Left  breast exhibits no inverted nipple, no mass, no nipple discharge, no skin change and no tenderness. Breasts are symmetrical.    Musculoskeletal: She exhibits no edema.  Lymphadenopathy:    She has no axillary adenopathy.       Right: No supraclavicular adenopathy present.       Left: No supraclavicular adenopathy present.  Neurological: She is alert and oriented to person, place, and time.  Skin: Skin is warm and dry. No rash noted.     Psychiatric: She has a normal mood and affect.    Assessment & Plan:   There are no  diagnoses linked to this encounter. Lynda was seen today for breast problem.  Diagnoses and all orders for this visit:  Essential hypertension -     amLODipine (NORVASC) 10 MG tablet; Take 1 tablet (10 mg total) by mouth daily.  Asthma, mild intermittent, uncomplicated -     albuterol (PROVENTIL HFA;VENTOLIN HFA) 108 (90 Base) MCG/ACT inhaler; Inhale 2 puffs into the lungs every 4 (four) hours as needed for wheezing or shortness of breath.  Dermatitis -     triamcinolone (KENALOG) 0.025 % cream; Apply 1 application topically 2 (two) times daily. To legs and L lateral areolar rash  Breast mass, right  Tinea versicolor -     ketoconazole (NIZORAL) 2 % cream; Apply 1 application topically daily.   Meds ordered this encounter  Medications  . triamcinolone (KENALOG) 0.025 % cream    Sig: Apply 1 application topically 2 (two) times daily. To legs and L lateral areolar rash    Dispense:  30 g    Refill:  3  . albuterol (PROVENTIL HFA;VENTOLIN HFA) 108 (90 Base) MCG/ACT inhaler    Sig: Inhale 2 puffs into the lungs every 4 (four) hours as needed for wheezing or shortness of breath.    Dispense:  1 Inhaler    Refill:  12  . amLODipine (NORVASC) 10 MG tablet    Sig: Take 1 tablet (10 mg total) by mouth daily.    Dispense:  30 tablet    Refill:  5  . ketoconazole (NIZORAL) 2 % cream    Sig: Apply 1 application topically daily.    Dispense:  60 g    Refill:  0    Follow-up: No Follow-up on file.   Dessa Phi MD

## 2015-10-12 NOTE — Progress Notes (Signed)
[facility] 10/12/2015 06/12/2014 06/05/2014  Decreased Interest 0 0 0  Down, Depressed, Hopeless 0 0 0  PHQ - 2 Score 0 0 0  Altered sleeping 0 - -  Tired, decreased energy 1 - -  Change in appetite 0 - -  Feeling bad or failure about yourself  0 - -  Trouble concentrating 0 - -  Moving slowly or fidgety/restless 0 - -  Suicidal thoughts 0 - -  PHQ-9 Score 1 - -    GAD 7 : Generalized Anxiety Score 10/12/2015  Nervous, Anxious, on Edge 0  Control/stop worrying 0  Worry too much - different things 0  Trouble relaxing 0  Restless 0  Easily annoyed or irritable 0  Afraid - awful might happen 0  Total GAD 7 Score 0

## 2015-10-12 NOTE — Assessment & Plan Note (Signed)
R breast mass favored to be benign Plan for repeat US and mammogram in September 2017.

## 2015-10-12 NOTE — Patient Instructions (Addendum)
Marylene Landngela was seen today for breast problem.  Diagnoses and all orders for this visit:  Essential hypertension -     amLODipine (NORVASC) 10 MG tablet; Take 1 tablet (10 mg total) by mouth daily.  Asthma, mild intermittent, uncomplicated -     albuterol (PROVENTIL HFA;VENTOLIN HFA) 108 (90 Base) MCG/ACT inhaler; Inhale 2 puffs into the lungs every 4 (four) hours as needed for wheezing or shortness of breath.  Dermatitis -     triamcinolone (KENALOG) 0.025 % cream; Apply 1 application topically 2 (two) times daily. To legs and L lateral areolar rash  Breast mass, right  Tinea versicolor -     ketoconazole (NIZORAL) 2 % cream; Apply 1 application topically daily.   I reviewed your ultrasound and mammogram from last month done to evaluate R breast mass.  No need for sooner ultrasound or mammogram.  Agree with repeat in 6 months.   Keep skin cool and dry  Apply kenalog to legs, small rash on back of neck on R side, small rash on L areola  Apply nizoral antifungal cream for any rash that develops under or between breast as this is more likely to be fungal.  F/u in 6 months, sooner if needed  Dr. Armen PickupFunches

## 2015-10-12 NOTE — Assessment & Plan Note (Signed)
Dermatitis on neck, L areola, legs  Kenalog cream

## 2015-10-12 NOTE — Progress Notes (Signed)
Discharge from lt breast  Pain scale #5 No tobacco user  No suicidal thoughts in the past two weeks

## 2015-11-20 ENCOUNTER — Emergency Department (HOSPITAL_BASED_OUTPATIENT_CLINIC_OR_DEPARTMENT_OTHER)
Admission: EM | Admit: 2015-11-20 | Discharge: 2015-11-20 | Disposition: A | Discharge status: Home / Self Care | Payer: Medicaid Other | Attending: Emergency Medicine | Admitting: Emergency Medicine

## 2015-11-20 ENCOUNTER — Encounter (HOSPITAL_BASED_OUTPATIENT_CLINIC_OR_DEPARTMENT_OTHER): Payer: Self-pay

## 2015-11-20 DIAGNOSIS — N644 Mastodynia: Secondary | ICD-10-CM | POA: Insufficient documentation

## 2015-11-20 DIAGNOSIS — Z79899 Other long term (current) drug therapy: Secondary | ICD-10-CM | POA: Diagnosis not present

## 2015-11-20 DIAGNOSIS — Z853 Personal history of malignant neoplasm of breast: Secondary | ICD-10-CM | POA: Insufficient documentation

## 2015-11-20 DIAGNOSIS — I10 Essential (primary) hypertension: Secondary | ICD-10-CM | POA: Diagnosis not present

## 2015-11-20 DIAGNOSIS — J45909 Unspecified asthma, uncomplicated: Secondary | ICD-10-CM | POA: Insufficient documentation

## 2015-11-20 DIAGNOSIS — F17200 Nicotine dependence, unspecified, uncomplicated: Secondary | ICD-10-CM | POA: Insufficient documentation

## 2015-11-20 NOTE — ED Notes (Addendum)
Spoke to PA regarding disposition. Plan is to refer to MD and discharge patient shortly.

## 2015-11-20 NOTE — ED Provider Notes (Signed)
CSN: [id number]     Arrival date & time 11/20/15  1125 History   First MD Initiated Contact with Patient 11/20/15 1309     Chief Complaint  Patient presents with  . Breast Pain    HPI   40 year old female presents today with complaints of left-sided breast pain. Patient reports that approximately one month ago she was seen by her primary care provider for redness and itchiness and discharge from her left areole her. She was placed on Kenalog cream which seemed to improve symptoms. Patient notes that she continued to have pain around the area, this has spread into the lateral aspect of the breast. She denies any redness, swelling, edema, warmth to touch, fever, chills, nausea, vomiting, discharge at this time. Patient cannot localize pain to one specific area rather the entire left lateral breast. Patient notes that she was seen at the breast center earlier in the year for nodules in her right breast that were not concerning for patient. Patient does note that she has a family history of breast cancer in her grandmother, her aunt, and her cousin. She is personal history of pharynx cancer. Patient reports she is a former smoker, notes that she quit approximately one year ago after smoking 2-3 years. She is not on any birth control, she denies any weight loss, night sweats, fever, chills. Patient denies breast-feeding.   Past Medical History  Diagnosis Date  . Asthma   . Hypertension   . Obesity    Past Surgical History  Procedure Laterality Date  . Cesarean section      1995-2000  . Abdominal hysterectomy  2009    [facility]    Family History  Problem Relation Age of Onset  . Hypertension Mother   . Hypertension Father    Social History  Substance Use Topics  . Smoking status: Current Some Day Smoker  . Smokeless tobacco: None  . Alcohol Use: Yes   OB History    No data available     Review of Systems  All other systems reviewed and are negative.   Allergies  Review of  patient's allergies indicates no known allergies.  Home Medications   Prior to Admission medications   Medication Sig Start Date End Date Taking? Authorizing Provider  albuterol (PROVENTIL HFA;VENTOLIN HFA) 108 (90 Base) MCG/ACT inhaler Inhale 2 puffs into the lungs every 4 (four) hours as needed for wheezing or shortness of breath. 10/12/15   Josalyn Funches, MD  amLODipine (NORVASC) 10 MG tablet Take 1 tablet (10 mg total) by mouth daily. 10/12/15   Josalyn Funches, MD  ipratropium (ATROVENT) 0.06 % nasal spray Place 2 sprays into both nostrils 4 (four) times daily. 03/09/15   Linna HoffJames D Kindl, MD  ketoconazole (NIZORAL) 2 % cream Apply 1 application topically daily. 10/12/15   Josalyn Funches, MD  naproxen sodium (ANAPROX) 220 MG tablet Take 220 mg by mouth 2 (two) times daily as needed (for pain).    Historical Provider, MD  triamcinolone (KENALOG) 0.025 % cream Apply 1 application topically 2 (two) times daily. To legs and L lateral areolar rash 10/12/15   Josalyn Funches, MD   BP 122/78 mmHg  Pulse 64  Temp(Src) 97.9 F (36.6 C) (Oral)  Resp 18  Ht 5\' 5"  (1.651 m)  Wt 124.739 kg  BMI 45.76 kg/m2  SpO2 100% Physical Exam  Constitutional: She is oriented to person, place, and time. She appears well-developed and well-nourished.  HENT:  Head: Normocephalic and atraumatic.  Eyes: Conjunctivae are  normal. Pupils are equal, round, and reactive to light. Right eye exhibits no discharge. Left eye exhibits no discharge. No scleral icterus.  Neck: Normal range of motion. No JVD present. No tracheal deviation present.  Pulmonary/Chest: Effort normal. No stridor.    No point tenderness other than complete left-sided breast tenderness, no discharge noted, no dimpling, redness, warmth to touch, fluctuance, or any palpable abnormalities  Neurological: She is alert and oriented to person, place, and time. Coordination normal.  Psychiatric: She has a normal mood and affect. Her behavior is normal.  Judgment and thought content normal.  Nursing note and vitals reviewed.   ED Course  Procedures (including critical care time) Labs Review Labs Reviewed - No data to display  Imaging Review No results found. I have personally reviewed and evaluated these images and lab results as part of my medical decision-making.   EKG Interpretation None      MDM   Final diagnoses:  Breast pain    Labs:  Imaging:  Consults:  Therapeutics:  Discharge Meds:   Assessment/Plan:Patient presents with left-sided breast pain. She has no signs of infectious etiology, no palpable masses. Patient does report a history of dermatitis to the breast that resolved. Due to patient's family history of breast cancer, and concerning pain she will need follow-up at the breast center for imaging. Patient is instructed contact breast Center and schedule imaging, she is encouraged to contact her primary care provider inform them of today's visit and all relevant data and need for advanced imaging. Patient assured me that she'll be following up with both the breast center in her primary care for further evaluation. I see no signs of infectious etiology at this time, and no palpable masses on physical exam. Patient is encouraged to return to emergency room if she is unable to follow-up with either primary or center. She verbalized understanding and agreement to today's plan.        Eyvonne Mechanic, PA-C 11/20/15 1640  Raeford Razor, MD 11/21/15 [phone]

## 2015-11-20 NOTE — ED Notes (Addendum)
C/o pain to left breast with nipple d/c x 1 month-came to ED due to increase in pain-NAD-steady gait-pt states she [facility]issues with both breast and bilat US done-f/u in 6 mos

## 2015-11-20 NOTE — Discharge Instructions (Signed)

## 2015-11-21 ENCOUNTER — Encounter (HOSPITAL_COMMUNITY): Payer: Self-pay | Admitting: *Deleted

## 2015-11-21 ENCOUNTER — Ambulatory Visit (HOSPITAL_COMMUNITY)
Admission: EM | Admit: 2015-11-21 | Discharge: 2015-11-21 | Disposition: A | Discharge status: Home / Self Care | Payer: Medicaid Other | Attending: Family Medicine | Admitting: Family Medicine

## 2015-11-21 ENCOUNTER — Telehealth: Payer: Self-pay | Admitting: Family Medicine

## 2015-11-21 DIAGNOSIS — N644 Mastodynia: Secondary | ICD-10-CM | POA: Diagnosis not present

## 2015-11-21 MED ORDER — TRAMADOL HCL 50 MG PO TABS: 50.0000 mg | ORAL_TABLET | Freq: Four times a day (QID) | ORAL | Status: DC | PRN

## 2015-11-21 MED ORDER — DICLOFENAC POTASSIUM 50 MG PO TABS: 50.0000 mg | ORAL_TABLET | Freq: Three times a day (TID) | ORAL | Status: DC

## 2015-11-21 NOTE — Discharge Instructions (Signed)
Breast Tenderness Breast tenderness is a common problem for women of all ages. Breast tenderness may cause mild discomfort to severe pain. It has a variety of causes. Your health care provider will find out the likely cause of your breast tenderness by examining your breasts, asking you about symptoms, and ordering some tests. Breast tenderness usually does not mean you have breast cancer. HOME CARE INSTRUCTIONS  Breast tenderness often can be handled at home. You can try:  Getting fitted for a new bra that provides more support, especially during exercise.  Wearing a more supportive bra or sports bra while sleeping when your breasts are very tender.  If you have a breast injury, apply ice to the area:  Put ice in a plastic bag.  Place a towel between your skin and the bag.  Leave the ice on for 20 minutes, 2-3 times a day.  If your breasts are too full of milk as a result of breastfeeding, try:  Expressing milk either by hand or with a breast pump.  Applying a warm compress to the breasts for relief.  Taking over-the-counter pain relievers, if approved by your health care provider.  Taking other medicines that your health care provider prescribes. These may include antibiotic medicines or birth control pills. Over the long term, your breast tenderness might be eased if you:  Cut down on caffeine.  Reduce the amount of fat in your diet. Keep a log of the days and times when your breasts are most tender. This will help you and your health care provider find the cause of the tenderness and how to relieve it. Also, learn how to do breast exams at home. This will help you notice if you have an unusual growth or lump that could cause tenderness. SEEK MEDICAL CARE IF:   Any part of your breast is hard, red, and hot to the touch. This could be a sign of infection.  Fluid is coming out of your nipples (and you are not breastfeeding). Especially watch for blood or pus.  You have a fever  as well as breast tenderness.  You have a new or painful lump in your breast that remains after your menstrual period ends.  You have tried to take care of the pain at home, but it has not gone away.  Your breast pain is getting worse, or the pain is making it hard to do the things you usually do during your day.   This information is not intended to replace advice given to you by your health care provider. Make sure you discuss any questions you have with your health care provider.   Document Released: 05/22/2008 Document Revised: 02/09/2013 Document Reviewed: 01/06/2013 Elsevier Interactive Patient Education 2016 Elsevier Inc.  Pain Without a Known Cause WHAT IS PAIN WITHOUT A KNOWN CAUSE? Pain can occur in any part of the body and can range from mild to severe. Sometimes no cause can be found for why you are having pain. Some types of pain that can occur without a known cause include:   Headache.  Back pain.  Abdominal pain.  Neck pain. HOW IS PAIN WITHOUT A KNOWN CAUSE DIAGNOSED?  Your health care provider will try to find the cause of your pain. This may include:  Physical exam.  Medical history.  Blood tests.  Urine tests.  X-rays. If no cause is found, your health care provider may diagnose you with pain without a known cause.  IS THERE TREATMENT FOR PAIN WITHOUT A CAUSE?  Treatment depends on the kind of pain you have. Your health care provider may prescribe medicines to help relieve your pain.  WHAT CAN I DO AT HOME FOR MY PAIN?   Take medicines only as directed by your health care provider.  Stop any activities that cause pain. During periods of severe pain, bed rest may help.  Try to reduce your stress with activities such as yoga or meditation. Talk to your health care provider for other stress-reducing activity recommendations.  Exercise regularly, if approved by your health care provider.  Eat a healthy diet that includes fruits and vegetables. This may  improve pain. Talk to your health care provider if you have any questions about your diet. WHAT IF MY PAIN DOES NOT GET BETTER?  If you have a painful condition and no reason can be found for the pain or the pain gets worse, it is important to follow up with your health care provider. It may be necessary to repeat tests and look further for a possible cause.    This information is not intended to replace advice given to you by your health care provider. Make sure you discuss any questions you have with your health care provider.   Document Released: 03/04/2001 Document Revised: 06/30/2014 Document Reviewed: 10/25/2013 Elsevier Interactive Patient Education 2016 [employer]  Mammogram A mammogram is an X-ray of the breasts that is done to check for changes that are not normal. This test can screen for and find any changes that may suggest breast cancer. This test can also help to find other changes and variations in the breast. BEFORE THE PROCEDURE  Have this test done about 1-2 weeks after your period. This is usually when your breasts are the least tender.  If you are visiting a new doctor or clinic, send any past mammogram images to your new doctor's office.  Wash your breasts and under your arms the day of the test.  Do not use deodorants, perfumes, lotions, or powders on the day of the test.  Take off any jewelry from your neck.  Wear clothes that you can change into and out of easily. PROCEDURE  You will undress from the waist up. You will put on a gown.  You will stand in front of the X-ray machine.  Each breast will be placed between two plastic or glass plates. The plates will press down on your breast for a few seconds. Try to stay as relaxed as possible. This does not cause any harm to your breasts. Any discomfort you feel will be very brief.  X-rays will be taken from different angles of each breast. The procedure may vary among doctors and hospitals.  AFTER THE  PROCEDURE  The mammogram will be looked at by a specialist (radiologist).  You may need to do certain parts of the test again. This depends on the quality of the images.  Ask when your test results will be ready. Make sure you get your test results.  You may go back to your normal activities.   This information is not intended to replace advice given to you by your health care provider. Make sure you discuss any questions you have with your health care provider.   Document Released: 09/05/2008 Document Revised: 05/29/2011 Document Reviewed: 08/18/2014 Elsevier Interactive Patient Education [employer] Elsevier Inc[employer]

## 2015-11-21 NOTE — Telephone Encounter (Signed)
Patient needs to urgently be referred to the breast center for testing....she was informed by ED that she needed a referral by pcp..  Patient states she is in pain   Please follow up

## 2015-11-21 NOTE — ED Provider Notes (Signed)
CSN: [id number]     Arrival date & time 11/21/15  1257 History   First MD Initiated Contact with Patient 11/21/15 1327     Chief Complaint  Patient presents with  . Breast Pain   (Consider location/radiation/quality/duration/timing/severity/associated sxs/prior Treatment) HPI Comments: 40 year old female presents to the urgent care with pain to the left aspect of the left breast. This started approximately one month ago. She initially saw her PCP when she had a rash on that side and was treated with topical anti-inflammatory medicines. The rash has since abated. At the outset there was also drainage from the nipple which has since stopped. The pain to the left breast has persisted. She presents to the urgent care today for breast pain only. She states she was not given any medicine from the ER told to take Tylenol only. Other measures tried R ice packs which did not help. When asked about the course of her symptoms, length of time and follow-ups and treatments she felt to mention the fact that she was evaluated and treated for the same breast pain yesterday in the emergency department. She had to be prompted and encouraged to mention that visit. She states that she was told to follow-up with her PCP and have arrangements for mammogram. The exam does not reveal any signs of infection, lumps or bumps or worrisome findings. The patient was instructed to follow with her PCP and obtain a mammogram as soon as possible. Dr. Timoteo AceFutch's office has been called and they are now working on an appointment for her mammogram.     Past Medical History  Diagnosis Date  . Asthma   . Hypertension   . Obesity    Past Surgical History  Procedure Laterality Date  . Cesarean section      1995-2000  . Abdominal hysterectomy  2009    Parcial    Family History  Problem Relation Age of Onset  . Hypertension Mother   . Hypertension Father    Social History  Substance Use Topics  . Smoking status: Current Some  Day Smoker  . Smokeless tobacco: None  . Alcohol Use: Yes   OB History    No data available     Review of Systems  Constitutional: Negative.   HENT: Negative.   Respiratory: Negative.   Musculoskeletal: Negative.   Skin: Negative.   Neurological: Negative.   All other systems reviewed and are negative.   Allergies  Review of patient's allergies indicates no known allergies.  Home Medications   Prior to Admission medications   Medication Sig Start Date End Date Taking? Authorizing Provider  albuterol (PROVENTIL HFA;VENTOLIN HFA) 108 (90 Base) MCG/ACT inhaler Inhale 2 puffs into the lungs every 4 (four) hours as needed for wheezing or shortness of breath. 10/12/15   Josalyn Funches, MD  amLODipine (NORVASC) 10 MG tablet Take 1 tablet (10 mg total) by mouth daily. 10/12/15   Josalyn Funches, MD  diclofenac (CATAFLAM) 50 MG tablet Take 1 tablet (50 mg total) by mouth 3 (three) times daily. One tablet TID with food prn pain. 11/21/15   Hayden Rasmussenavid Rhonna Holster, NP  ipratropium (ATROVENT) 0.06 % nasal spray Place 2 sprays into both nostrils 4 (four) times daily. 03/09/15   Linna HoffJames D Kindl, MD  ketoconazole (NIZORAL) 2 % cream Apply 1 application topically daily. 10/12/15   Josalyn Funches, MD  traMADol (ULTRAM) 50 MG tablet Take 1 tablet (50 mg total) by mouth every 6 (six) hours as needed. 11/21/15   Hayden Rasmussenavid Geselle Cardosa, NP  triamcinolone (KENALOG) 0.025 % cream Apply 1 application topically 2 (two) times daily. To legs and L lateral areolar rash 10/12/15   Dessa Phi, MD   Meds Ordered and Administered this Visit  Medications - No data to display  BP 124/83 mmHg  Pulse 85  Temp(Src) 97.9 F (36.6 C) (Oral)  Resp 16  SpO2 98% No data found.   Physical Exam  Constitutional: She is oriented to person, place, and time. She appears well-developed and well-nourished. No distress.  Eyes: EOM are normal.  Neck: Normal range of motion. Neck supple.  Cardiovascular: Normal rate.   Pulmonary/Chest: Effort  normal. No respiratory distress.    Tenderness to the left lateral breast including the areola. No overlying skin changes. No erythema or other discoloration. No palpable lumps, bumps. No evidence of swelling. No drainage from the nipple. Nipples are everted. No palpable surrounding lymphadenopathy.  Musculoskeletal: She exhibits no edema.  Neurological: She is alert and oriented to person, place, and time. She exhibits normal muscle tone.  Skin: Skin is warm and dry.  Psychiatric: She has a normal mood and affect.  Nursing note and vitals reviewed.   ED Course  Procedures (including critical care time)  Labs Review Labs Reviewed - No data to display  Imaging Review No results found.   Visual Acuity Review  Right Eye Distance:   Left Eye Distance:   Bilateral Distance:    Right Eye Near:   Left Eye Near:    Bilateral Near:         MDM   1. Breast pain, left    Tashia, mA present during breast exam. Follow-up with your PCP and complete mammogram sent is possible. Meds ordered this encounter  Medications  . diclofenac (CATAFLAM) 50 MG tablet    Sig: Take 1 tablet (50 mg total) by mouth 3 (three) times daily. One tablet TID with food prn pain.    Dispense:  21 tablet    Refill:  0    Order Specific Question:  Supervising Provider    Answer:  Charm Rings [id number]  . traMADol (ULTRAM) 50 MG tablet    Sig: Take 1 tablet (50 mg total) by mouth every 6 (six) hours as needed.    Dispense:  15 tablet    Refill:  0    Order Specific Question:  Supervising Provider    Answer:  Micheline Chapman       Hayden Rasmussen, NP 11/21/15 1354

## 2015-11-21 NOTE — ED Notes (Signed)
Pt  Reports  Pain l  Breast       With  Tenderness        Present    To  The  Affected   Area  -  Pt   Reports      Was  Seen  About  1  Month  Ago         Pt  States     She  Was  Given  A  Cream  For  The  Symptoms        Pt  Reports   Was  Given   A  Cream  By  The  Provider  And has  Been taking tylenol/  Motrin   For  The  Symptoms     Pt  Reports      Symptoms  Worse  X  1  Week

## 2015-11-22 NOTE — Telephone Encounter (Signed)
L breast imaging ordered Please call back to patient and schedule

## 2015-11-23 ENCOUNTER — Ambulatory Visit (INDEPENDENT_AMBULATORY_CARE_PROVIDER_SITE_OTHER): Payer: Managed Care, Other (non HMO) | Admitting: Obstetrics and Gynecology

## 2015-11-23 ENCOUNTER — Encounter: Payer: Self-pay | Admitting: Obstetrics and Gynecology

## 2015-11-23 VITALS — BP 130/87 | HR 65 | Ht 65.0 in | Wt 283.0 lb

## 2015-11-23 DIAGNOSIS — N644 Mastodynia: Secondary | ICD-10-CM

## 2015-11-23 MED ORDER — CEPHALEXIN 500 MG PO CAPS
500.0000 mg | ORAL_CAPSULE | Freq: Four times a day (QID) | ORAL | Status: DC
Start: 2015-11-23 — End: 2016-04-09

## 2015-11-23 NOTE — Progress Notes (Signed)
Patient ID: Stacey Price, female   DOB: [birth year], 40 y.o.   MRN: [id number] 40 yo presenting today for the evaluation of left breast pain which has been present for over a month. Patient denies any trauma to the area. She is not using any contraception. She denies any leakage from her breast.   Past Medical History  Diagnosis Date  . Asthma   . Hypertension   . Obesity    Past Surgical History  Procedure Laterality Date  . Cesarean section      1995-2000  . Abdominal hysterectomy  2009    Parcial    Family History  Problem Relation Age of Onset  . Hypertension Mother   . Hypertension Father    Social History  Substance Use Topics  . Smoking status: Former Smoker    Quit date: 11/23/2014  . Smokeless tobacco: Not on file  . Alcohol Use: 0.0 oz/week    0 Standard drinks or equivalent per week   ROS See pertinent in HPI  Blood pressure 130/87, pulse 65, height 5\' 5"  (1.651 m), weight 283 lb (128.368 kg). GENERAL: Well-developed, well-nourished female in no acute distress. Obese BREASTS: Symmetric in size. Tenderness to the left lateral breast including the areola. No overlying skin changes. No erythema or other discoloration. Palpable induration surrounding the arerola which is tender to palpation. No appreciable abscess. No evidence of swelling. No drainage from the nipple. Nipples are everted. No palpable surrounding lymphadenopathy. ABDOMEN: Soft, nontender, nondistended. Obese EXTREMITIES: No cyanosis, clubbing, or edema, 2+ distal pulses.  A/P 40 yo with left breast pain for a month - Keflex provided for possible infectious process - Advised to continue anti-inflammatory and apply warm compresses to the area - Breast ultrasound ordered for further evaluation - Follow up mammogram due in September - Patient will be contacted with any abnormal results and further management plan - RTC prn

## 2015-11-23 NOTE — Addendum Note (Signed)
Addended by: Arne ClevelandHUTCHINSON, Scarlett Portlock J on: 11/23/2015 11:47 AM   Modules accepted: Orders

## 2015-11-27 ENCOUNTER — Telehealth: Payer: Self-pay | Admitting: *Deleted

## 2015-11-27 NOTE — Telephone Encounter (Signed)
Pt states she has been bleeding for 2 days, low grade fever, has not had period in 5478yrs. Made appt for Thursday as pt needs eval sooner rather than later.

## 2015-11-27 NOTE — Telephone Encounter (Signed)
Error to previous documentation.  Correct documentation: Pt called in wanting pain meds for breast pain. Pt was evaluated on Friday for breast pain and has US scheduled 11/28/15. Advised pt that we would need US results before calling in any further medication. Advised pt to alternate Tylenol/Ibuprofen until Dr. Jolayne Pantheronstant can review US. Pt expressed understanding.

## 2015-11-28 ENCOUNTER — Ambulatory Visit
Admission: RE | Admit: 2015-11-28 | Discharge: 2015-11-28 | Disposition: A | Discharge status: Home / Self Care | Payer: Managed Care, Other (non HMO) | Source: Ambulatory Visit | Attending: Family Medicine | Admitting: Family Medicine

## 2015-11-28 ENCOUNTER — Other Ambulatory Visit: Payer: Self-pay | Admitting: Family Medicine

## 2015-11-28 ENCOUNTER — Ambulatory Visit
Admission: RE | Admit: 2015-11-28 | Discharge: 2015-11-28 | Disposition: A | Discharge status: Home / Self Care | Payer: Managed Care, Other (non HMO) | Source: Ambulatory Visit | Attending: Obstetrics and Gynecology | Admitting: Obstetrics and Gynecology

## 2015-11-28 DIAGNOSIS — N644 Mastodynia: Secondary | ICD-10-CM

## 2015-11-28 NOTE — Telephone Encounter (Signed)
Pt has appointment today.  

## 2015-11-30 ENCOUNTER — Telehealth: Payer: Self-pay | Admitting: *Deleted

## 2015-11-30 NOTE — Telephone Encounter (Signed)
Pt called requesting results of Breast diagnostic mammo.  Informed pt that scan was WDL and to follow-up in 4 months for a repeat breast US follow-up of the right breast due to recommendations from a previous scan.  Pt states she is still experiencing pain in the left breast.  Informed pt to continue the antibiotics, anti-inflammatory, and heat compresses.  Informed pt that we may need to send her to a breast specialist since the test were normal, pt also wanted to see if Dr Jolayne Pantheronstant would order an US of the left breast since they only performed the diagnostic mammogram only.  Informed pt that I would discuss these options with Dr Jolayne Pantheronstant who is currently out of the office and we would call her back on Monday. Pt acknowledged instructions.

## 2015-12-03 ENCOUNTER — Telehealth: Payer: Self-pay | Admitting: *Deleted

## 2015-12-03 ENCOUNTER — Encounter: Payer: Self-pay | Admitting: *Deleted

## 2015-12-03 DIAGNOSIS — N644 Mastodynia: Secondary | ICD-10-CM

## 2015-12-03 NOTE — Telephone Encounter (Signed)
Spoke to pt, states she is on the last day of the antibiotics and is still experiencing the continued breast pain, will make referral to the Breast Center due to continued pain and normal diagnostic mammogram.

## 2015-12-03 NOTE — Telephone Encounter (Signed)
-----   Message from Catalina AntiguaPeggy Constant, MD sent at 12/02/2015  9:07 AM EDT ----- Please inform patient of normal left breast mammogram without evidence of malignancy or infection. She should complete the antibiotics that she already started. If she continues to experience pain, please refer to breast center for further evaluation and management.  Her right breast needs follow up in July. She should already have an appointment scheduled for that. If not, patient should contact her PCP who ordered the original mammogram in March  Thanks  Peggy

## 2015-12-04 ENCOUNTER — Other Ambulatory Visit: Payer: Self-pay | Admitting: Obstetrics and Gynecology

## 2015-12-04 DIAGNOSIS — N6489 Other specified disorders of breast: Secondary | ICD-10-CM

## 2015-12-05 ENCOUNTER — Other Ambulatory Visit: Payer: Self-pay | Admitting: Obstetrics and Gynecology

## 2015-12-05 ENCOUNTER — Telehealth: Payer: Self-pay | Admitting: *Deleted

## 2015-12-05 ENCOUNTER — Encounter (INDEPENDENT_AMBULATORY_CARE_PROVIDER_SITE_OTHER): Payer: Self-pay | Admitting: *Deleted

## 2015-12-05 DIAGNOSIS — N6489 Other specified disorders of breast: Secondary | ICD-10-CM

## 2015-12-05 DIAGNOSIS — N644 Mastodynia: Secondary | ICD-10-CM

## 2015-12-05 NOTE — Telephone Encounter (Signed)
Ordered Left Breast imaging - [facility] 12/11/15

## 2015-12-05 NOTE — Telephone Encounter (Signed)
Patient with normal right breast mammogram. No evidence of infectious process or suspicion for malignancy. Results explained to the patient. She was advised to complete antibiotics which she had already started. Referral to breast clinic made if pain persists.

## 2015-12-11 ENCOUNTER — Other Ambulatory Visit: Payer: Managed Care, Other (non HMO)

## 2016-02-12 ENCOUNTER — Encounter (HOSPITAL_BASED_OUTPATIENT_CLINIC_OR_DEPARTMENT_OTHER): Payer: Self-pay | Admitting: *Deleted

## 2016-02-12 ENCOUNTER — Emergency Department (HOSPITAL_BASED_OUTPATIENT_CLINIC_OR_DEPARTMENT_OTHER)
Admission: EM | Admit: 2016-02-12 | Discharge: 2016-02-12 | Disposition: A | Discharge status: Home / Self Care | Payer: Medicaid Other | Attending: Emergency Medicine | Admitting: Emergency Medicine

## 2016-02-12 DIAGNOSIS — L739 Follicular disorder, unspecified: Secondary | ICD-10-CM | POA: Insufficient documentation

## 2016-02-12 DIAGNOSIS — H6092 Unspecified otitis externa, left ear: Secondary | ICD-10-CM | POA: Diagnosis not present

## 2016-02-12 DIAGNOSIS — I10 Essential (primary) hypertension: Secondary | ICD-10-CM | POA: Diagnosis not present

## 2016-02-12 DIAGNOSIS — J45909 Unspecified asthma, uncomplicated: Secondary | ICD-10-CM | POA: Insufficient documentation

## 2016-02-12 DIAGNOSIS — Z87891 Personal history of nicotine dependence: Secondary | ICD-10-CM | POA: Diagnosis not present

## 2016-02-12 DIAGNOSIS — Z79899 Other long term (current) drug therapy: Secondary | ICD-10-CM | POA: Diagnosis not present

## 2016-02-12 DIAGNOSIS — R21 Rash and other nonspecific skin eruption: Secondary | ICD-10-CM | POA: Diagnosis present

## 2016-02-12 MED ORDER — CIPROFLOXACIN-DEXAMETHASONE 0.3-0.1 % OT SUSP
4.0000 [drp] | Freq: Two times a day (BID) | OTIC | 0 refills | Status: AC
Start: 2016-02-12 — End: 2016-02-22

## 2016-02-12 MED ORDER — MUPIROCIN CALCIUM 2 % EX CREA: 1.0000 "application " | TOPICAL_CREAM | Freq: Two times a day (BID) | CUTANEOUS | 0 refills | Status: DC

## 2016-02-12 MED ORDER — CIPROFLOXACIN-DEXAMETHASONE 0.3-0.1 % OT SUSP
4.0000 [drp] | Freq: Once | OTIC | Status: AC
  Administered 2016-02-12: 4 [drp] via OTIC
  Filled 2016-02-12: qty 7.5

## 2016-02-12 MED ORDER — DOXYCYCLINE HYCLATE 100 MG PO CAPS
100.0000 mg | ORAL_CAPSULE | Freq: Two times a day (BID) | ORAL | 0 refills | Status: AC
Start: 2016-02-12 — End: 2016-02-22

## 2016-02-12 MED FILL — MUPIROCIN 2% OINTMENT: 2 | 11 days supply | Qty: 22 | Fill #0

## 2016-02-12 MED FILL — DOXYCYCLINE HYCLATE 100 MG: 100 | 10 days supply | Qty: 20 | Fill #0

## 2016-02-12 NOTE — ED Triage Notes (Signed)
Patient c/o skin rash that has spread from rhe right side of her neck to her arms and legs. She was treated last month at [facility] for the [facility]. Also c/o hear to ear out of left ear

## 2016-02-12 NOTE — ED Notes (Signed)
Pt in restroom 

## 2016-02-12 NOTE — ED Notes (Signed)
Ears flushed with elephant wash, wax removed, no complications

## 2016-02-12 NOTE — ED Provider Notes (Signed)
[facility]   CSN: [id number] Arrival date & time: 02/12/16  [id number]     History   Chief Complaint Chief Complaint  Patient presents with  . Rash    HPI Stacey Price is a 40 y.o. female.  HPI 40 year old female with past medical history of hypertension, asthma and obesity who presents with diffuse body rash since April and a 24 hour history of left ear pain. The patient states that since April. She has had chronic, unchanging, mildly painful papular rash to her bilateral lower extremities, right flank and the left breast. She was seen at urgent care for the symptoms and given triamcinolone cream and steroid cream which has not improved her symptoms. She's had no associated fevers or chills. She thought she was getting the rash in her ear due to some pruritus and pain in her ear yesterday. She began cleaning her ear with a Q-tip and then states that she has had decreased hearing and pain with the year. She reports sharp pain with manipulation of the ear. No fevers or chills. No headache, neck stiffness or vision changes  Past Medical History:  Diagnosis Date  . Asthma   . Hypertension   . Obesity     Patient Active Problem List   Diagnosis Date Noted  . HTN (hypertension) 10/12/2015  . Asthma 10/12/2015  . Dermatitis 10/12/2015  . Breast mass, right 10/12/2015  . Tinea versicolor 10/12/2015    Past Surgical History:  Procedure Laterality Date  . ABDOMINAL HYSTERECTOMY  2009   Parcial   . CESAREAN SECTION     1995-2000    OB History    No data available       Home Medications    Prior to Admission medications   Medication Sig Start Date End Date Taking? Authorizing Provider  albuterol (PROVENTIL HFA;VENTOLIN HFA) 108 (90 Base) MCG/ACT inhaler Inhale 2 puffs into the lungs every 4 (four) hours as needed for wheezing or shortness of breath. 10/12/15   Josalyn Funches, MD  amLODipine (NORVASC) 10 MG tablet Take 1 tablet (10 mg total) by mouth  daily. 10/12/15   Josalyn Funches, MD  cephALEXin (KEFLEX) 500 MG capsule Take 1 capsule (500 mg total) by mouth 4 (four) times daily. 11/23/15   Peggy Constant, MD  ciprofloxacin-dexamethasone (CIPRODEX) otic suspension Place 4 drops into the left ear 2 (two) times daily. 02/12/16 02/22/16  Shaune Pollackameron Audra Bellard, MD  diclofenac (CATAFLAM) 50 MG tablet Take 1 tablet (50 mg total) by mouth 3 (three) times daily. One tablet TID with food prn pain. 11/21/15   Hayden Rasmussenavid Mabe, NP  doxycycline (VIBRAMYCIN) 100 MG capsule Take 1 capsule (100 mg total) by mouth 2 (two) times daily. 02/12/16 02/22/16  Shaune Pollackameron Calla Wedekind, MD  ipratropium (ATROVENT) 0.06 % nasal spray Place 2 sprays into both nostrils 4 (four) times daily. 03/09/15   Linna HoffJames D Kindl, MD  ketoconazole (NIZORAL) 2 % cream Apply 1 application topically daily. 10/12/15   Josalyn Funches, MD  mupirocin cream (BACTROBAN) 2 % Apply 1 application topically 2 (two) times daily. Apply to rash/affected areas. 02/12/16   Shaune Pollackameron Taima Rada, MD  traMADol (ULTRAM) 50 MG tablet Take 1 tablet (50 mg total) by mouth every 6 (six) hours as needed. 11/21/15   Hayden Rasmussenavid Mabe, NP  triamcinolone (KENALOG) 0.025 % cream Apply 1 application topically 2 (two) times daily. To legs and L lateral areolar rash 10/12/15   Dessa PhiJosalyn Funches, MD    Family History Family History  Problem Relation Age of  Onset  . Hypertension Mother   . Hypertension Father     Social History Social History  Substance Use Topics  . Smoking status: Former Smoker    Quit date: 11/23/2014  . Smokeless tobacco: Not on file  . Alcohol use 0.0 oz/week     Allergies   Review of patient's allergies indicates no known allergies.   Review of Systems Review of Systems  Constitutional: Negative for chills and fever.  HENT: Positive for ear discharge and ear pain. Negative for congestion, rhinorrhea and sore throat.   Eyes: Negative for visual disturbance.  Respiratory: Negative for cough, shortness of breath and wheezing.     Cardiovascular: Negative for chest pain and leg swelling.  Gastrointestinal: Negative for abdominal pain, diarrhea, nausea and vomiting.  Genitourinary: Negative for dysuria, flank pain, vaginal bleeding and vaginal discharge.  Musculoskeletal: Negative for neck pain.  Skin: Positive for rash.  Allergic/Immunologic: Negative for immunocompromised state.  Neurological: Negative for syncope and headaches.  Hematological: Does not bruise/bleed easily.  All other systems reviewed and are negative.    Physical Exam Updated Vital Signs BP 112/77 (BP Location: Right Arm)   Pulse 62   Temp 97.9 F (36.6 C) (Oral)   Resp 18   Ht 5\' 6"  (1.676 m)   Wt 275 lb (124.7 kg)   SpO2 100%   BMI 44.39 kg/m   Physical Exam  Constitutional: She is oriented to person, place, and time. She appears well-developed and well-nourished. No distress.  HENT:  Head: Normocephalic and atraumatic.  Mild pain with palpation of tragus and manipulation of the external ear. No external drainage. No redness of the auricle. Ear canal occluded by cerumen. There is mild edema and discharge of the external ear canal. Visualized tympanic membrane appears translucent gray and nonerythematous. No tenderness to palpation over the mastoid. No redness or swelling over the mastoid.  Eyes: Conjunctivae are normal.  Neck: Neck supple.  Cardiovascular: Normal rate, regular rhythm and normal heart sounds.  Exam reveals no friction rub.   No murmur heard. Pulmonary/Chest: Effort normal and breath sounds normal. No respiratory distress. She has no wheezes. She has no rales.  Abdominal: She exhibits no distension.  Musculoskeletal: She exhibits no edema.  Neurological: She is alert and oriented to person, place, and time. She exhibits normal muscle tone.  Skin: Skin is warm. Capillary refill takes less than 2 seconds.  Punctate, papular, mildly painful rash to bilateral lower extremities, right flank and left breast. Rash centered  around hair follicles with central punctate areas of pustule. No fluctuance. No significant erythema outside of surrounding area of follicle. No crepitance.  Psychiatric: She has a normal mood and affect.  Nursing note and vitals reviewed.    ED Treatments / Results  Labs (all labs ordered are listed, but only abnormal results are displayed) Labs Reviewed - No data to display  EKG  EKG Interpretation None       Radiology No results found.  Procedures Procedures (including critical care time)  Medications Ordered in ED Medications  ciprofloxacin-dexamethasone (CIPRODEX) 0.3-0.1 % otic suspension 4 drop (4 drops Left Ear Given 02/12/16 [id number])     Initial Impression / Assessment and Plan / ED Course  I have reviewed the triage vital signs and the nursing notes.  Pertinent labs & imaging results that were available during my care of the patient were reviewed by me and considered in my medical decision making (see chart for details).  Clinical Course  Value Comment By  Time  BP: 138/88 (Reviewed) Shaune Pollack, MD 08/35 217  40 year old female with past medical history as above who presents with a several month history of rash as well as a several day history of left ear pain. Regarding her left ear pain, she has diffuse external auditory canal edema and small amount of drainage. She also has cerumen impaction. The cerumen was irrigated with visualization of the TM. TM appears gray, translucent with no signs of acute otitis media. Patient does admit to heavy use of Q-tips in this ear. I suspect the patient has a mild otitis externa secondary to skin abrasions from external trauma. No signs of TM perforation. No mastoid tenderness or signs of mastoiditis. Will give Ciprodex drops and advised outpatient follow-up. Hearing appears normal.   Regarding her rash, she does have subacute to acute folliculitis in multiple areas. She denies any hot tub or pool use. She denies any swimming or  legs. The rash is mildly painful. There is minimal surrounding erythema with no evidence of superimposed cellulitis or abscess. Will give her a course of doxycycline for this and advised outpatient follow-up. She also has chronic eczema skin changes and I'll advise dermatology follow-up for her recurrent rashes. No signs of SJS, TN, or other emergent etiologies.   Final Clinical Impressions(s) / ED Diagnoses   Final diagnoses:  Otitis externa, left  Folliculitis    New Prescriptions Discharge Medication List as of 02/12/2016  7:48 AM    START taking these medications   Details  ciprofloxacin-dexamethasone (CIPRODEX) otic suspension Place 4 drops into the left ear 2 (two) times daily., Starting Tue 02/12/2016, Until Fri 02/22/2016, Print    doxycycline (VIBRAMYCIN) 100 MG capsule Take 1 capsule (100 mg total) by mouth 2 (two) times daily., Starting Tue 02/12/2016, Until Fri 02/22/2016, Print    mupirocin cream (BACTROBAN) 2 % Apply 1 application topically 2 (two) times daily. Apply to rash/affected areas., Starting Tue 02/12/2016, Print         Shaune Pollack, MD 02/12/16 [phone]

## 2016-03-09 ENCOUNTER — Other Ambulatory Visit: Payer: Self-pay | Admitting: Family Medicine

## 2016-03-09 DIAGNOSIS — B360 Pityriasis versicolor: Secondary | ICD-10-CM

## 2016-03-31 ENCOUNTER — Other Ambulatory Visit: Payer: Self-pay | Admitting: Obstetrics and Gynecology

## 2016-03-31 ENCOUNTER — Ambulatory Visit
Admission: RE | Admit: 2016-03-31 | Discharge: 2016-03-31 | Disposition: A | Discharge status: Home / Self Care | Payer: Medicaid Other | Source: Ambulatory Visit | Attending: Obstetrics and Gynecology | Admitting: Obstetrics and Gynecology

## 2016-03-31 DIAGNOSIS — N6489 Other specified disorders of breast: Secondary | ICD-10-CM

## 2016-04-09 ENCOUNTER — Emergency Department (HOSPITAL_BASED_OUTPATIENT_CLINIC_OR_DEPARTMENT_OTHER)
Admission: EM | Admit: 2016-04-09 | Discharge: 2016-04-09 | Disposition: A | Discharge status: Home / Self Care | Payer: Medicaid Other | Attending: Emergency Medicine | Admitting: Emergency Medicine

## 2016-04-09 ENCOUNTER — Encounter (HOSPITAL_BASED_OUTPATIENT_CLINIC_OR_DEPARTMENT_OTHER): Payer: Self-pay

## 2016-04-09 DIAGNOSIS — I10 Essential (primary) hypertension: Secondary | ICD-10-CM | POA: Insufficient documentation

## 2016-04-09 DIAGNOSIS — J45909 Unspecified asthma, uncomplicated: Secondary | ICD-10-CM | POA: Diagnosis not present

## 2016-04-09 DIAGNOSIS — Z87891 Personal history of nicotine dependence: Secondary | ICD-10-CM | POA: Diagnosis not present

## 2016-04-09 DIAGNOSIS — L0291 Cutaneous abscess, unspecified: Secondary | ICD-10-CM

## 2016-04-09 DIAGNOSIS — L02212 Cutaneous abscess of back [any part, except buttock]: Secondary | ICD-10-CM | POA: Insufficient documentation

## 2016-04-09 MED ORDER — HYDROCODONE-ACETAMINOPHEN 5-325 MG PO TABS: 2.0000 | ORAL_TABLET | ORAL | 0 refills | Status: DC | PRN

## 2016-04-09 MED ORDER — SULFAMETHOXAZOLE-TRIMETHOPRIM 800-160 MG PO TABS: 1.0000 | ORAL_TABLET | Freq: Two times a day (BID) | ORAL | 0 refills | Status: AC

## 2016-04-09 MED FILL — HYDROCODON-APAP 5-325: 5-325 | 2 days supply | Qty: 20 | Fill #0

## 2016-04-09 MED FILL — SULFAMETHOXAZOLE/TMP DS TAB: 800-160 | 7 days supply | Qty: 14 | Fill #0

## 2016-04-09 NOTE — ED Provider Notes (Signed)
[facility] Provider Note   CSN: [id number] Arrival date & time: 04/09/16  1204     History   Chief Complaint Chief Complaint  Patient presents with  . Abscess    HPI Stacey Price is a 39 y.o. female.  The history is provided by the patient. No language interpreter was used.  Abscess  Location:  Torso Torso abscess location:  Lower back Size:  1 Abscess quality: draining   Red streaking: no   Duration:  2 days Progression:  Worsening Chronicity:  New Context: not diabetes   Relieved by:  Nothing Worsened by:  Nothing Ineffective treatments:  None tried Associated symptoms: no fever   Pt thinks she has an infected area over her tailbone.   Past Medical History:  Diagnosis Date  . Asthma   . Hypertension   . Obesity     Patient Active Problem List   Diagnosis Date Noted  . HTN (hypertension) 10/12/2015  . Asthma 10/12/2015  . Dermatitis 10/12/2015  . Breast mass, right 10/12/2015  . Tinea versicolor 10/12/2015    Past Surgical History:  Procedure Laterality Date  . ABDOMINAL HYSTERECTOMY  2009   [facility]   . CESAREAN SECTION     1995-2000    OB History    No data available       Home Medications    Prior to Admission medications   Medication Sig Start Date End Date Taking? Authorizing Provider  albuterol (PROVENTIL HFA;VENTOLIN HFA) 108 (90 Base) MCG/ACT inhaler Inhale 2 puffs into the lungs every 4 (four) hours as needed for wheezing or shortness of breath. 10/12/15   Dessa Phi, MD  HYDROcodone-acetaminophen (NORCO/VICODIN) 5-325 MG tablet Take 2 tablets by mouth every 4 (four) hours as needed. 04/09/16   Elson Areas, PA-C  sulfamethoxazole-trimethoprim (BACTRIM DS,SEPTRA DS) 800-160 MG tablet Take 1 tablet by mouth 2 (two) times daily. 04/09/16 04/16/16  Elson Areas, PA-C    Family History Family History  Problem Relation Age of Onset  . Hypertension Mother   . Hypertension Father     Social  History Social History  Substance Use Topics  . Smoking status: Former Smoker    Quit date: 11/23/2014  . Smokeless tobacco: Never Used  . Alcohol use Yes     Comment: occ     Allergies   Review of patient's allergies indicates no known allergies.   Review of Systems Review of Systems  Constitutional: Negative for fever.  All other systems reviewed and are negative.    Physical Exam Updated Vital Signs BP 143/87 (BP Location: Left Arm)   Pulse 72   Temp 98.2 F (36.8 C) (Oral)   Resp 16   SpO2 100%   Physical Exam  Constitutional: She appears well-developed and well-nourished. No distress.  HENT:  Head: Normocephalic.  Eyes: Conjunctivae are normal.  Cardiovascular: Normal rate.   No murmur heard. Pulmonary/Chest: Effort normal. No respiratory distress.  Abdominal: There is no tenderness.  Musculoskeletal: She exhibits no edema.  Neurological: She is alert.  Skin: Skin is warm.  1cm red area top of buttock crease, open area,  Small amount of drainage,  No fluctuance.  Psychiatric: She has a normal mood and affect.  Nursing note and vitals reviewed.    ED Treatments / Results  Labs (all labs ordered are listed, but only abnormal results are displayed) Labs Reviewed - No data to display  EKG  EKG Interpretation None       Radiology No  results found.  Procedures Procedures (including critical care time)  Medications Ordered in ED Medications - No data to display   Initial Impression / Assessment and Plan / ED Course  I have reviewed the triage vital signs and the nursing notes.  Pertinent labs & imaging results that were available during my care of the patient were reviewed by me and considered in my medical decision making (see chart for details).  Clinical Course    Pt advised soak area 20 minutes 4 times a day   Return for recheck in 2 days  Final Clinical Impressions(s) / ED Diagnoses   Final diagnoses:  Abscess    New  Prescriptions Discharge Medication List as of 04/09/2016 12:55 PM    START taking these medications   Details  HYDROcodone-acetaminophen (NORCO/VICODIN) 5-325 MG tablet Take 2 tablets by mouth every 4 (four) hours as needed., Starting Wed 04/09/2016, Print    sulfamethoxazole-trimethoprim (BACTRIM DS,SEPTRA DS) 800-160 MG tablet Take 1 tablet by mouth 2 (two) times daily., Starting Wed 04/09/2016, Until Wed 04/16/2016, Print         Lonia SkinnerLeslie K Glen LyonSofia, PA-C 04/09/16 1325    Nelva Nayobert [facility], MD 04/10/16 [phone]

## 2016-04-09 NOTE — Discharge Instructions (Signed)
Soak area 20 minutes 4 times a day °

## 2016-04-09 NOTE — ED Triage Notes (Signed)
C/o "boil to the tip of my tailbone" x 2 days

## 2016-04-14 ENCOUNTER — Encounter: Payer: Self-pay | Admitting: Internal Medicine

## 2016-04-14 ENCOUNTER — Ambulatory Visit (INDEPENDENT_AMBULATORY_CARE_PROVIDER_SITE_OTHER): Payer: Medicaid Other | Admitting: Internal Medicine

## 2016-04-14 ENCOUNTER — Other Ambulatory Visit: Payer: Self-pay | Admitting: Internal Medicine

## 2016-04-14 VITALS — BP 121/67 | HR 69 | Temp 98.1°F | Ht 67.4 in | Wt 283.8 lb

## 2016-04-14 DIAGNOSIS — L0591 Pilonidal cyst without abscess: Secondary | ICD-10-CM

## 2016-04-14 DIAGNOSIS — B9689 Other specified bacterial agents as the cause of diseases classified elsewhere: Secondary | ICD-10-CM

## 2016-04-14 DIAGNOSIS — R234 Changes in skin texture: Secondary | ICD-10-CM | POA: Diagnosis not present

## 2016-04-14 DIAGNOSIS — N6311 Unspecified lump in the right breast, upper outer quadrant: Secondary | ICD-10-CM | POA: Diagnosis not present

## 2016-04-14 DIAGNOSIS — N6310 Unspecified lump in the right breast, unspecified quadrant: Secondary | ICD-10-CM

## 2016-04-14 MED ORDER — IBUPROFEN 800 MG PO TABS: 800.0000 mg | ORAL_TABLET | Freq: Three times a day (TID) | ORAL | 0 refills | Status: DC | PRN

## 2016-04-14 NOTE — Patient Instructions (Addendum)
Stacey Price it was nice meeting you today.  -Continue taking Bactrim as instructed  -Continue warm water soaks  -Please call the breast center to schedule an appointment for a left breast ultrasound and mammogram  -Please return for a follow-up visit in 1-2 weeks    Pilonidal Cyst A pilonidal cyst is a fluid-filled sac. It forms beneath the skin near your tailbone, at the top of the crease of your buttocks. A pilonidal cyst that is not large or infected may not cause symptoms or problems. If the cyst becomes irritated or infected, it may fill with pus. This causes pain and swelling (pilonidal abscess). An infected cyst may need to be treated with medicine, drained, or removed. CAUSES The cause of a pilonidal cyst is not known. One cause may be a hair that grows into your skin (ingrown hair). RISK FACTORS Pilonidal cysts are more common in boys and men. Risk factors include:  Having lots of hair near the crease of the buttocks.  Being overweight.  Having a pilonidal dimple.  Wearing tight clothing.  Not bathing or showering frequently.  Sitting for long periods of time. SIGNS AND SYMPTOMS Signs and symptoms of a pilonidal cyst may include:  Redness.  Pain and tenderness.  Warmth.  Swelling.  Pus.  Fever. DIAGNOSIS Your health care provider may diagnose a pilonidal cyst based on your symptoms and a physical exam. The health care provider may do a blood test to check for infection. If your cyst is draining pus, your health care provider may take a sample of the drainage to be tested at a laboratory. TREATMENT Surgery is the usual treatment for an infected pilonidal cyst. You may also have to take medicines before surgery. The type of surgery you have depends on the size and severity of the infected cyst. The different kinds of surgery include:  Incision and drainage. This is a procedure to open and drain the cyst.  Marsupialization. In this procedure, a large cyst or  abscess may be opened and kept open by stitching the edges of the skin to the cyst walls.  Cyst removal. This procedure involves opening the skin and removing all or part of the cyst. HOME CARE INSTRUCTIONS  Follow all of your surgeon's instructions carefully if you had surgery.  Take medicines only as directed by your health care provider.  If you were prescribed an antibiotic medicine, finish it all even if you start to feel better.  Keep the area around your pilonidal cyst clean and dry.  Clean the area as directed by your health care provider. Pat the area dry with a clean towel. Do not rub it as this may cause bleeding.  Remove hair from the area around the cyst as directed by your health care provider.  Do not wear tight clothing or sit in one place for long periods of time.  There are many different ways to close and cover an incision, including stitches, skin glue, and adhesive strips. Follow your health care provider's instructions on:  Incision care.  Bandage (dressing) changes and removal.  Incision closure removal. SEEK MEDICAL CARE IF:   You have drainage, redness, swelling, or pain at the site of the cyst.  You have a fever.   This information is not intended to replace advice given to you by your health care provider. Make sure you discuss any questions you have with your health care provider.   Document Released: 06/06/2000 Document Revised: 06/30/2014 Document Reviewed: 10/27/2013 Elsevier Interactive Patient Education  2016 [employer].

## 2016-04-15 DIAGNOSIS — L309 Dermatitis, unspecified: Secondary | ICD-10-CM | POA: Insufficient documentation

## 2016-04-15 DIAGNOSIS — R234 Changes in skin texture: Secondary | ICD-10-CM | POA: Insufficient documentation

## 2016-04-15 DIAGNOSIS — L0591 Pilonidal cyst without abscess: Secondary | ICD-10-CM | POA: Insufficient documentation

## 2016-04-15 NOTE — Progress Notes (Signed)
   CC: Patient is complaining of a breast mass and an anal abscess.   HPI:  Stacey Price is a 40 y.o. F with a PMHx of conditions listed below presenting to the clinic complaning of a breast mass and an anal abscess. Patient was previously visiting [facility] and wants to establish care here now. Please see problem based charting for the status of the patient's current and chronic medical conditions.   Past Medical History:  Diagnosis Date  . Asthma   . Hypertension   . Obesity     Review of Systems:  Pertinent positives mentioned in HPI. Remainder of all ROS negative.   Physical Exam:  Vitals:   04/14/16 1621  BP: 121/67  Pulse: 69  Temp: 98.1 F (36.7 C)  TempSrc: Oral  SpO2: 100%  Weight: 283 lb 12.8 oz (128.7 kg)  Height: 5' 7.4" (1.712 m)   Physical Exam  Constitutional: She is oriented to person, place, and time. She appears well-developed and well-nourished. No distress.  HENT:  Head: Normocephalic and atraumatic.  Mouth/Throat: Oropharynx is clear and moist.  Eyes: EOM are normal.  Neck: Neck supple. No tracheal deviation present.  Cardiovascular: Normal rate, regular rhythm and intact distal pulses.   Pulmonary/Chest: Effort normal and breath sounds normal. No respiratory distress.  Abdominal: Soft. Bowel sounds are normal. She exhibits no distension. There is no tenderness.  Musculoskeletal: Normal range of motion. She exhibits no edema.  Neurological: She is alert and oriented to person, place, and time.  Skin: Skin is warm and dry.  1x1 cm area of induration at the intergluteal cleft. No drainage from the midline opening. Area is mildly TTP. No fluctuance.   R breast: A small possible area of induration in the upper outer quadrant of the R breast. No axillary LAD appreciated.  L breast: 1 x 0.5 cm area of induration with slight peeling of skin on the L areola. No nipple drainage noted. No axillary LAD appreciated.     Assessment & Plan:     See Encounters Tab for problem based charting.  Patient discussed with Dr. Cleda DaubE. Hoffman

## 2016-04-15 NOTE — Assessment & Plan Note (Addendum)
A Patient reports having a mass in her R breast since 08/2015. Right breast mammogram done in 08/2015 showing a probably benign hypoechoic mass favored to be a complicated cyst. Repeat mammogram and US of the R breast done 03/31/2016 showing stable probably benign assymetry within the upper outee quadrant. Follow-up R breast mammogram in recommnded in 6 months.  P -Order for a follow-up diagnostic mammogram of the R breast in 6 months has been placed.

## 2016-04-15 NOTE — Assessment & Plan Note (Signed)
A Patient reports having L breast skin changes on the areola since 11/2015. Records show she visited OB-GYN in 11/2015 for pain in her left breast and [facility] and mammogram placed on 12/05/15. There are results from a L breast mammogram done 11/28/2015 showing no evidence of malignancy. Patient states this mammogram was done prior to her noticing skin changes on her areola. States she was prescribed Kenalog cream in the past which did not help.   P -Our office is working on getting her an appointment at the breast center as soon as possible. I encouraged the patient to call them as well.

## 2016-04-15 NOTE — Assessment & Plan Note (Signed)
HPI Patient is complaining of having a painful "anal abscess" for > 1 week. States she went to the ED (on 04/09/16) and the area couldn't be drained, as such, she was discharged with Bactrim and Vicodin. States she continues to do warm baths and soaks. Reports noticing some drainage from the area a few days ago. Denies having any fevers or chills.   A Pilonidal cyst noted in the intergluteal cleft on exam. No drainage or fluctuance noted.   P -Advised patient to finish her 7 day course of Bactrim -Ibuprofen 800 mg TID prn pain -Referral to general surgery for possible excision

## 2016-04-18 NOTE — Progress Notes (Signed)
Internal Medicine Clinic Attending  Case discussed with Dr. Loney Lohathore at the time of the visit.  We reviewed the resident's history and exam and pertinent patient test results.  I agree with the assessment, diagnosis, and plan of care documented in the resident's note. It appears there are orders for U/S and diagnostic mammogram of left breast that was not completed.  As she has skin changes she needs this to happen to exclude inflammatory breast cancer.

## 2016-04-28 ENCOUNTER — Ambulatory Visit: Payer: Medicaid Other

## 2016-05-16 ENCOUNTER — Emergency Department (HOSPITAL_BASED_OUTPATIENT_CLINIC_OR_DEPARTMENT_OTHER)
Admission: EM | Admit: 2016-05-16 | Discharge: 2016-05-16 | Disposition: A | Discharge status: Home / Self Care | Payer: Medicaid Other | Attending: Emergency Medicine | Admitting: Emergency Medicine

## 2016-05-16 ENCOUNTER — Encounter (HOSPITAL_BASED_OUTPATIENT_CLINIC_OR_DEPARTMENT_OTHER): Payer: Self-pay | Admitting: *Deleted

## 2016-05-16 ENCOUNTER — Emergency Department (HOSPITAL_BASED_OUTPATIENT_CLINIC_OR_DEPARTMENT_OTHER): Payer: Medicaid Other

## 2016-05-16 DIAGNOSIS — Z87891 Personal history of nicotine dependence: Secondary | ICD-10-CM | POA: Diagnosis not present

## 2016-05-16 DIAGNOSIS — R0789 Other chest pain: Secondary | ICD-10-CM | POA: Insufficient documentation

## 2016-05-16 DIAGNOSIS — Z79899 Other long term (current) drug therapy: Secondary | ICD-10-CM | POA: Insufficient documentation

## 2016-05-16 DIAGNOSIS — I10 Essential (primary) hypertension: Secondary | ICD-10-CM | POA: Insufficient documentation

## 2016-05-16 DIAGNOSIS — J45909 Unspecified asthma, uncomplicated: Secondary | ICD-10-CM | POA: Diagnosis not present

## 2016-05-16 DIAGNOSIS — R079 Chest pain, unspecified: Secondary | ICD-10-CM

## 2016-05-16 LAB — BASIC METABOLIC PANEL
ANION GAP: 5 (ref 5–15)
BUN: 9 mg/dL (ref 6–20)
CALCIUM: 8.6 mg/dL — AB (ref 8.9–10.3)
CO2: 25 mmol/L (ref 22–32)
Chloride: 108 mmol/L (ref 101–111)
Creatinine, Ser: 0.83 mg/dL (ref 0.44–1.00)
Glucose, Bld: 88 mg/dL (ref 65–99)
Potassium: 3.4 mmol/L — ABNORMAL LOW (ref 3.5–5.1)
Sodium: 138 mmol/L (ref 135–145)

## 2016-05-16 LAB — CBC WITH DIFFERENTIAL/PLATELET
BASOS PCT: 0 %
Basophils Absolute: 0 10*3/uL (ref 0.0–0.1)
Eosinophils Absolute: 0.2 10*3/uL (ref 0.0–0.7)
Eosinophils Relative: 3 %
HEMATOCRIT: 34.7 % — AB (ref 36.0–46.0)
HEMOGLOBIN: 12.4 g/dL (ref 12.0–15.0)
Lymphocytes Relative: 29 %
Lymphs Abs: 1.8 10*3/uL (ref 0.7–4.0)
MCH: 30.7 pg (ref 26.0–34.0)
MCHC: 35.7 g/dL (ref 30.0–36.0)
MCV: 85.9 fL (ref 78.0–100.0)
Monocytes Absolute: 0.6 10*3/uL (ref 0.1–1.0)
Monocytes Relative: 10 %
NEUTROS ABS: 3.6 10*3/uL (ref 1.7–7.7)
NEUTROS PCT: 58 %
Platelets: 200 10*3/uL (ref 150–400)
RBC: 4.04 MIL/uL (ref 3.87–5.11)
RDW: 12.5 % (ref 11.5–15.5)
WBC: 6.2 10*3/uL (ref 4.0–10.5)

## 2016-05-16 LAB — TROPONIN I: Troponin I: <0.03 ng/mL (ref <0.03)

## 2016-05-16 LAB — D-DIMER, QUANTITATIVE: D-Dimer, Quant: <0.27 ug/mL-FEU (ref 0.00–0.50)

## 2016-05-16 MED ORDER — ASPIRIN 81 MG PO CHEW
324.0000 mg | CHEWABLE_TABLET | Freq: Once | ORAL | Status: AC
  Administered 2016-05-16: 324 mg via ORAL
  Filled 2016-05-16: qty 4

## 2016-05-16 NOTE — ED Triage Notes (Signed)
Pt /p mid sternal chest pain x 3 days , denies SOB n/v

## 2016-05-16 NOTE — ED Provider Notes (Signed)
[facility]   CSN: [id number] Arrival date & time: 05/16/16  1256     History   Chief Complaint Chief Complaint  Patient presents with  . Chest Pain    HPI Stacey Price is a 40 y.o. female.  40 year old female with hypertension and asthma who presents with chest pain. The patient reports 3 days of constant, central, nonradiating chest pain that is worse when she lays on her right side and worse when she takes a deep breath. She denies any associated shortness of breath, nausea, vomiting, or fevers. She does report that last week she had a cough/cold illness as well as vomiting but this has improved. No recent travel, OCP use, leg swelling, history of cancer, or history of blood clots. No family history of early heart disease.   The history is provided by the patient.  Chest Pain      Past Medical History:  Diagnosis Date  . Asthma   . Hypertension   . Obesity     Patient Active Problem List   Diagnosis Date Noted  . Pilonidal cyst 04/15/2016  . Breast skin changes 04/15/2016  . HTN (hypertension) 10/12/2015  . Asthma 10/12/2015  . Dermatitis 10/12/2015  . Breast mass, right 10/12/2015  . Tinea versicolor 10/12/2015    Past Surgical History:  Procedure Laterality Date  . ABDOMINAL HYSTERECTOMY  2009   [facility]   . CESAREAN SECTION     1995-2000    OB History    No data available       Home Medications    Prior to Admission medications   Medication Sig Start Date End Date Taking? Authorizing Provider  amLODipine (NORVASC) 5 MG tablet Take 5 mg by mouth daily.   Yes Historical Provider, MD  Fluticasone-Salmeterol (ADVAIR) 250-50 MCG/DOSE AEPB Inhale 1 puff into the lungs 2 (two) times daily.   Yes Historical Provider, MD  albuterol (PROVENTIL HFA;VENTOLIN HFA) 108 (90 Base) MCG/ACT inhaler Inhale 2 puffs into the lungs every 4 (four) hours as needed for wheezing or shortness of breath. 10/12/15   Dessa PhiJosalyn Funches, MD  ibuprofen  (ADVIL,MOTRIN) 800 MG tablet Take 1 tablet (800 mg total) by mouth every 8 (eight) hours as needed. 04/14/16   John GiovanniVasundhra Rathore, MD    Family History Family History  Problem Relation Age of Onset  . Hypertension Mother   . Hypertension Father     Social History Social History  Substance Use Topics  . Smoking status: Former Smoker    Quit date: 11/23/2014  . Smokeless tobacco: Never Used  . Alcohol use Yes     Comment: occ     Allergies   Patient has no known allergies.   Review of Systems Review of Systems  Cardiovascular: Positive for chest pain.   10 Systems reviewed and are negative for acute change except as noted in the HPI.   Physical Exam Updated Vital Signs BP 129/77 (BP Location: Left Arm)   Pulse 60   Temp 98.9 F (37.2 C) (Oral)   Resp 16   Ht 5\' 7"  (1.702 m)   Wt 265 lb (120.2 kg)   SpO2 100%   BMI 41.50 kg/m   Physical Exam  Constitutional: She is oriented to person, place, and time. She appears well-developed and well-nourished. No distress.  HENT:  Head: Normocephalic and atraumatic.  Moist mucous membranes  Eyes: Conjunctivae are normal. Pupils are equal, round, and reactive to light.  Neck: Neck supple.  Cardiovascular: Normal rate, regular  rhythm and normal heart sounds.   No murmur heard. Pulmonary/Chest: Effort normal and breath sounds normal.  Abdominal: Soft. Bowel sounds are normal. She exhibits no distension. There is no tenderness.  Musculoskeletal: She exhibits no edema.  Neurological: She is alert and oriented to person, place, and time.  Fluent speech  Skin: Skin is warm and dry.  Psychiatric: She has a normal mood and affect. Judgment normal.  Nursing note and vitals reviewed.    ED Treatments / Results  Labs (all labs ordered are listed, but only abnormal results are displayed) Labs Reviewed  BASIC METABOLIC PANEL - Abnormal; Notable for the following:       Result Value   Potassium 3.4 (*)    Calcium 8.6 (*)    All  other components within normal limits  CBC WITH DIFFERENTIAL/PLATELET - Abnormal; Notable for the following:    HCT 34.7 (*)    All other components within normal limits  D-DIMER, QUANTITATIVE (NOT AT [facility])  TROPONIN I  TROPONIN I    EKG  EKG Interpretation  Date/Time:  Friday May 16 2016 13:07:43 EST Ventricular Rate:  68 PR Interval:  152 QRS Duration: 82 QT Interval:  424 QTC Calculation: 450 R Axis:   80 Text Interpretation:  Normal sinus rhythm Nonspecific ST abnormality Abnormal ECG No significant change since last tracing Confirmed by Karma GanjaLINKER  MD, MARTHA [phone]) on 05/16/2016 1:12:45 PM Also confirmed by Karma GanjaLINKER  MD, MARTHA [phone]), editor [place], Cala BradfordKIMBERLY [phone])  on 05/16/2016 1:24:18 PM       Radiology Dg Chest 2 View  Result Date: 05/16/2016 CLINICAL DATA:  Chest pain for 3 days EXAM: CHEST  2 VIEW COMPARISON:  05/30/2015 FINDINGS: The heart size and mediastinal contours are within normal limits. Both lungs are clear. The visualized skeletal structures are unremarkable. IMPRESSION: No active cardiopulmonary disease. Electronically Signed   By: Alcide CleverMark  Lukens M.D.   On: 05/16/2016 15:32    Procedures Procedures (including critical care time)  Medications Ordered in ED Medications  aspirin chewable tablet 324 mg (324 mg Oral Given 05/16/16 1531)     Initial Impression / Assessment and Plan / ED Course  I have reviewed the triage vital signs and the nursing notes.  Pertinent labs & imaging results that were available during my care of the patient were reviewed by me and considered in my medical decision making (see chart for details).  Clinical Course    PT w/ 3 days of constant chest pain, Nonexertional and no associated symptoms. Had cough illness last week. She was well-appearing with normal vital signs on exam. No leg swelling. Obtained above lab work including serial troponins and d-dimer. Labs unremarkable, CXR negative acute, EKG unchanged from previous.  With normal d-dimer, PE is very unlikely. No ripping or tearing chest pain and no radiation to the back to suggest aortic dissection. Her HEART score is </=3 therefore I feel she is safe for discharge with PCP follow-up. I have discussed the possibility of an inflammatory process such as costochondritis or pericarditis because of her recent cough symptoms. Instructed her to take NSAIDs for the next 3-4 days and reviewed return precautions. Patient voiced understanding and was discharged in satisfactory condition.  Final Clinical Impressions(s) / ED Diagnoses   Final diagnoses:  Nonspecific chest pain    New Prescriptions Discharge Medication List as of 05/16/2016  7:02 PM       Laurence Spatesachel Morgan Little, MD 05/16/16 1928

## 2016-05-16 NOTE — ED Notes (Signed)
Pt states she had a gi bug last week and had been vomiting as well as intermittent cough.  Pt has pain that increases with inspiration and with palpation.

## 2016-05-16 NOTE — ED Notes (Signed)
Patient is resting comfortably. 

## 2016-05-16 NOTE — ED Notes (Signed)
ED Provider at bedside. 

## 2016-05-16 NOTE — Discharge Instructions (Signed)
Take ibuprofen 600mg  every 6 hours for 3-4 days. Take with food.

## 2016-07-28 ENCOUNTER — Emergency Department (HOSPITAL_BASED_OUTPATIENT_CLINIC_OR_DEPARTMENT_OTHER)
Admission: EM | Admit: 2016-07-28 | Discharge: 2016-07-28 | Disposition: A | Discharge status: Home / Self Care | Payer: Medicaid Other | Attending: Dermatology | Admitting: Dermatology

## 2016-07-28 ENCOUNTER — Encounter (HOSPITAL_BASED_OUTPATIENT_CLINIC_OR_DEPARTMENT_OTHER): Payer: Self-pay

## 2016-07-28 DIAGNOSIS — Z79899 Other long term (current) drug therapy: Secondary | ICD-10-CM | POA: Insufficient documentation

## 2016-07-28 DIAGNOSIS — I10 Essential (primary) hypertension: Secondary | ICD-10-CM | POA: Insufficient documentation

## 2016-07-28 DIAGNOSIS — J45909 Unspecified asthma, uncomplicated: Secondary | ICD-10-CM | POA: Insufficient documentation

## 2016-07-28 DIAGNOSIS — Z87891 Personal history of nicotine dependence: Secondary | ICD-10-CM | POA: Insufficient documentation

## 2016-07-28 DIAGNOSIS — R079 Chest pain, unspecified: Secondary | ICD-10-CM | POA: Diagnosis not present

## 2016-07-28 DIAGNOSIS — Z5321 Procedure and treatment not carried out due to patient leaving prior to being seen by health care provider: Secondary | ICD-10-CM | POA: Insufficient documentation

## 2016-07-28 NOTE — ED Notes (Signed)
Unable to locate pt x1

## 2016-07-28 NOTE — ED Triage Notes (Addendum)
C/o CP x today-pain is worse with movement-denies injury-dizziness x 2 weeks-NAD-steady gait

## 2016-07-28 NOTE — ED Notes (Signed)
Called for pt to recheck VS.  Unable to locate in lobby x 1.

## 2016-07-28 NOTE — ED Triage Notes (Signed)
Pt later added she has bruising to left breast x 2 weeks-no injury

## 2016-07-28 NOTE — ED Notes (Signed)
No response when called to lobby for patient

## 2016-07-29 ENCOUNTER — Encounter (HOSPITAL_BASED_OUTPATIENT_CLINIC_OR_DEPARTMENT_OTHER): Payer: Self-pay | Admitting: *Deleted

## 2016-07-29 ENCOUNTER — Emergency Department (HOSPITAL_BASED_OUTPATIENT_CLINIC_OR_DEPARTMENT_OTHER): Payer: Medicaid Other

## 2016-07-29 ENCOUNTER — Emergency Department (HOSPITAL_BASED_OUTPATIENT_CLINIC_OR_DEPARTMENT_OTHER)
Admission: EM | Admit: 2016-07-29 | Discharge: 2016-07-29 | Disposition: A | Discharge status: Home / Self Care | Payer: Medicaid Other | Attending: Emergency Medicine | Admitting: Emergency Medicine

## 2016-07-29 DIAGNOSIS — R0789 Other chest pain: Secondary | ICD-10-CM | POA: Diagnosis not present

## 2016-07-29 DIAGNOSIS — Z79899 Other long term (current) drug therapy: Secondary | ICD-10-CM | POA: Insufficient documentation

## 2016-07-29 DIAGNOSIS — I10 Essential (primary) hypertension: Secondary | ICD-10-CM | POA: Diagnosis not present

## 2016-07-29 DIAGNOSIS — J45909 Unspecified asthma, uncomplicated: Secondary | ICD-10-CM | POA: Insufficient documentation

## 2016-07-29 DIAGNOSIS — Z87891 Personal history of nicotine dependence: Secondary | ICD-10-CM | POA: Diagnosis not present

## 2016-07-29 LAB — BASIC METABOLIC PANEL
ANION GAP: 5 (ref 5–15)
BUN: 10 mg/dL (ref 6–20)
CALCIUM: 8.4 mg/dL — AB (ref 8.9–10.3)
CO2: 25 mmol/L (ref 22–32)
CREATININE: 0.8 mg/dL (ref 0.44–1.00)
Chloride: 109 mmol/L (ref 101–111)
Glucose, Bld: 108 mg/dL — ABNORMAL HIGH (ref 65–99)
Potassium: 3.5 mmol/L (ref 3.5–5.1)
SODIUM: 139 mmol/L (ref 135–145)

## 2016-07-29 LAB — TROPONIN I

## 2016-07-29 LAB — CBC
HCT: 34.7 % — ABNORMAL LOW (ref 36.0–46.0)
Hemoglobin: 12 g/dL (ref 12.0–15.0)
MCH: 30.5 pg (ref 26.0–34.0)
MCHC: 34.6 g/dL (ref 30.0–36.0)
MCV: 88.1 fL (ref 78.0–100.0)
PLATELETS: 195 10*3/uL (ref 150–400)
RBC: 3.94 MIL/uL (ref 3.87–5.11)
RDW: 13.1 % (ref 11.5–15.5)
WBC: 5.6 10*3/uL (ref 4.0–10.5)

## 2016-07-29 MED ORDER — KETOROLAC TROMETHAMINE 30 MG/ML IJ SOLN
30.0000 mg | Freq: Once | INTRAMUSCULAR | Status: AC
  Administered 2016-07-29: 30 mg via INTRAVENOUS
  Filled 2016-07-29: qty 1

## 2016-07-29 MED ORDER — NAPROXEN 500 MG PO TABS: 500.0000 mg | ORAL_TABLET | Freq: Two times a day (BID) | ORAL | 0 refills | Status: DC

## 2016-07-29 MED ORDER — ASPIRIN 81 MG PO CHEW
324.0000 mg | CHEWABLE_TABLET | Freq: Once | ORAL | Status: AC
  Administered 2016-07-29: 324 mg via ORAL
  Filled 2016-07-29: qty 4

## 2016-07-29 MED FILL — NAPROXEN 500 MG TABLET: 500 | 15 days supply | Qty: 30 | Fill #0

## 2016-07-29 NOTE — ED Notes (Signed)
ED Provider at bedside. 

## 2016-07-29 NOTE — ED Triage Notes (Signed)
Pt here yesterday for same c/o but LWBS due to wait. Dr. Karma GanjaLinker at bedside to evaluate during triage. C/o pain in left chest and left shoulder. Pain worse with movement of left arm.Pt also reports pain in her head when she laughs and feels lightheaded when she stands up or bends forward

## 2016-07-29 NOTE — ED Notes (Signed)
Pt directed to pharmacy to pick up Rx. Note for work given 

## 2016-07-29 NOTE — Discharge Instructions (Signed)
Return to the ED with any concerns including difficulty breathing, leg swelling, fainting, worsening pain, decreased level of alertness/lethargy, or any other alarming symptoms °

## 2016-07-29 NOTE — ED Provider Notes (Signed)
[facility] Provider Note   CSN: [id number] Arrival date & time: 07/29/16  0716     History   Chief Complaint Chief Complaint  Patient presents with  . Chest Pain    HPI Stacey Price is a 41 y.o. female.  HPI  Pt presenting with c/o chest pain. Pt states the pain is overlying left chest.  Pain is worse with palpation and with coughing.  She has not had significant cough or fever.  Pain began yesterday and has been constant since that time.  No nausea.  No radiation.  No diaphoresis associated. Pain is also worse with movement of the left arm.  No signfiicant injury or change in activities.  She has not tried anything for her symptoms.  No leg swelling, no hx of DVT/PE, no recent travel/trauma/surgery, does not use OCPs.  There are no other associated systemic symptoms, there are no other alleviating or modifying factors.   Past Medical History:  Diagnosis Date  . Asthma   . Hypertension   . Obesity     Patient Active Problem List   Diagnosis Date Noted  . Pilonidal cyst 04/15/2016  . Breast skin changes 04/15/2016  . HTN (hypertension) 10/12/2015  . Asthma 10/12/2015  . Dermatitis 10/12/2015  . Breast mass, right 10/12/2015  . Tinea versicolor 10/12/2015    Past Surgical History:  Procedure Laterality Date  . ABDOMINAL HYSTERECTOMY  2009   [facility]   . CESAREAN SECTION     1995-2000    OB History    No data available       Home Medications    Prior to Admission medications   Medication Sig Start Date End Date Taking? Authorizing Provider  albuterol (PROVENTIL HFA;VENTOLIN HFA) 108 (90 Base) MCG/ACT inhaler Inhale into the lungs every 6 (six) hours as needed for wheezing or shortness of breath.   Yes Historical Provider, MD  amLODipine (NORVASC) 5 MG tablet Take 5 mg by mouth daily.    Historical Provider, MD  Fluticasone-Salmeterol (ADVAIR) 250-50 MCG/DOSE AEPB Inhale 1 puff into the lungs 2 (two) times daily.    Historical Provider, MD    ibuprofen (ADVIL,MOTRIN) 800 MG tablet Take 1 tablet (800 mg total) by mouth every 8 (eight) hours as needed. 04/14/16   John GiovanniVasundhra Rathore, MD  naproxen (NAPROSYN) 500 MG tablet Take 1 tablet (500 mg total) by mouth 2 (two) times daily. 07/29/16   Jerelyn ScottMartha Linker, MD    Family History Family History  Problem Relation Age of Onset  . Hypertension Mother   . Hypertension Father     Social History Social History  Substance Use Topics  . Smoking status: Former Smoker    Quit date: 11/23/2014  . Smokeless tobacco: Never Used  . Alcohol use Yes     Comment: occ     Allergies   Patient has no known allergies.   Review of Systems Review of Systems  ROS reviewed and all otherwise negative except for mentioned in HPI   Physical Exam Updated Vital Signs BP 120/79   Pulse (!) 55   Temp 98.4 F (36.9 C) (Oral)   Resp 15   Ht 5\' 6"  (1.676 m)   Wt 295 lb (133.8 kg)   SpO2 100%   BMI 47.61 kg/m  Vitals reviewed Physical Exam Physical Examination: General appearance - alert, well appearing, and in no distress Mental status - alert, oriented to person, place, and time Eyes - no conjunctival injection, no scleral icterus Chest - clear to  auscultation, no wheezes, rales or rhonchi, symmetric air entry, ttp over left anterior lateral chest wall and anterior shoulder Heart - normal rate, regular rhythm, normal S1, S2, no murmurs, rubs, clicks or gallops Abdomen - soft, nontender, nondistended, no masses or organomegaly Neurological - alert, oriented, normal speech Extremities - peripheral pulses normal, no pedal edema, no clubbing or cyanosis, pain with ROM of left shoulder- causes pain to be exacerbated in left anterior chest wall Skin - normal coloration and turgor, no rashes  ED Treatments / Results  Labs (all labs ordered are listed, but only abnormal results are displayed) Labs Reviewed  CBC - Abnormal; Notable for the following:       Result Value   HCT 34.7 (*)    All other  components within normal limits  BASIC METABOLIC PANEL - Abnormal; Notable for the following:    Glucose, Bld 108 (*)    Calcium 8.4 (*)    All other components within normal limits  TROPONIN I    EKG  EKG Interpretation  Date/Time:  Tuesday July 29 2016 07:41:38 EST Ventricular Rate:  57 PR Interval:    QRS Duration: 92 QT Interval:  455 QTC Calculation: 443 R Axis:   71 Text Interpretation:  Sinus rhythm Baseline wander in lead(s) V6 No significant change since last tracing Confirmed by Karma Ganja  MD, MARTHA [phone]) on 07/29/2016 8:05:45 AM       Radiology Dg Chest 2 View  Result Date: 07/29/2016 CLINICAL DATA:  Achiness, fever and flu symptoms for chief 2 weeks. Left chest pain beginning last night. EXAM: CHEST  2 VIEW COMPARISON:  PA and lateral chest 05/16/2016 and 05/30/2015. FINDINGS: Lung clear. Heart size is upper normal. No pneumothorax or pleural fluid. No bony abnormality. IMPRESSION: No acute disease. Electronically Signed   By: Drusilla Kanner M.D.   On: 07/29/2016 07:54    Procedures Procedures (including critical care time)  Medications Ordered in ED Medications  ketorolac (TORADOL) 30 MG/ML injection 30 mg (30 mg Intravenous Given 07/29/16 0831)  aspirin chewable tablet 324 mg (324 mg Oral Given 07/29/16 6045)     Initial Impression / Assessment and Plan / ED Course  I have reviewed the triage vital signs and the nursing notes.  Pertinent labs & imaging results that were available during my care of the patient were reviewed by me and considered in my medical decision making (see chart for details).     Pt presenting with c/o chest pain.  Pt has a heart score of 1.  Therefore low risk for ACS, in addition her pain is reproducible over the area of concern in left chest wall and made worse by movement of left arm.  Pt has a PERC score of 0 making her very low risk for PE as well.  Other tests show no pneumonia, no PTX or other acute abnormalities.  Pt states her  pain has been constant for greater than 16 hours, therefore one troponin will r/o ACS even further.  Pt advised to take naproxen- as this is her 2nd ED visit for chest pain, advised f/u with PMD as well as cardiology for further evaluation if needed.  Discharged with strict return precautions.  Pt agreeable with plan.  Final Clinical Impressions(s) / ED Diagnoses   Final diagnoses:  Chest wall pain    New Prescriptions Discharge Medication List as of 07/29/2016  9:57 AM    START taking these medications   Details  naproxen (NAPROSYN) 500 MG tablet Take 1  tablet (500 mg total) by mouth 2 (two) times daily., Starting Tue 07/29/2016, Print         Jerelyn Scott, MD 07/29/16 1239

## 2016-10-14 ENCOUNTER — Telehealth: Payer: Self-pay

## 2016-10-14 NOTE — Telephone Encounter (Signed)
APT. REMINDER CALL, LMTCB °

## 2016-10-15 ENCOUNTER — Encounter (INDEPENDENT_AMBULATORY_CARE_PROVIDER_SITE_OTHER): Payer: Self-pay

## 2016-10-15 ENCOUNTER — Ambulatory Visit (INDEPENDENT_AMBULATORY_CARE_PROVIDER_SITE_OTHER): Payer: 59 | Admitting: Pulmonary Disease

## 2016-10-15 VITALS — BP 136/72 | HR 58 | Temp 98.0°F | Wt 274.6 lb

## 2016-10-15 DIAGNOSIS — Z87891 Personal history of nicotine dependence: Secondary | ICD-10-CM | POA: Diagnosis not present

## 2016-10-15 DIAGNOSIS — R21 Rash and other nonspecific skin eruption: Secondary | ICD-10-CM

## 2016-10-15 DIAGNOSIS — J309 Allergic rhinitis, unspecified: Secondary | ICD-10-CM

## 2016-10-15 DIAGNOSIS — L309 Dermatitis, unspecified: Secondary | ICD-10-CM

## 2016-10-15 MED ORDER — TRIAMCINOLONE ACETONIDE 0.1 % EX CREA: 1.0000 "application " | TOPICAL_CREAM | Freq: Two times a day (BID) | CUTANEOUS | 0 refills | Status: DC

## 2016-10-15 MED ORDER — CETIRIZINE HCL 10 MG PO TABS: 10.0000 mg | ORAL_TABLET | Freq: Every day | ORAL | 1 refills | Status: DC

## 2016-10-15 NOTE — Assessment & Plan Note (Signed)
Itchy nose and ears as well as rhinorrhea system with allergic rhinitis. Start cetirizine 10 mg daily

## 2016-10-15 NOTE — Patient Instructions (Signed)
Take cetirizine or other allergy medicine daily Use the steroid cream two times a day for 2 to 4 weeks Follow up as needed if your rash fails to improve or worsens or you develop new symptoms.  For an ear cleaning solution, use large syringe and 1 part hydrogen peroxide in 10 parts warm water.

## 2016-10-15 NOTE — Progress Notes (Signed)
   CC: leg rash  HPI:  Stacey Price is a 41 y.o. woman with history as noted below here for evaluation of lower extremity rash.  She has recurrence of her rash. It has been now 2-3 weeks. She had similar rash last year. It improved with steroid cream. At that time she used the cream for a little over a month. It is itchy. Her daughter has a similar rash on her chest.   Ears are itching. No watery or itchy eyes. She has an itchy nose as well - sometimes has drainage that is clear or yellowish.  Does not take any allergy medicine. Has not taken any benadryl. She has tried hydrocortisone 1% which didn't help. Also tried over the counter antifungal cream. No changes in medication recently.  No changes in lotion, perfumes, detergents. No changes in diet.    Past Medical History:  Diagnosis Date  . Asthma   . Hypertension   . Obesity     Review of Systems:   No fevers or chills or weight loss. No cough.  Physical Exam:  Vitals:   10/15/16 0926  BP: 136/72  Pulse: (!) 58  Temp: 98 F (36.7 C)  TempSrc: Oral  SpO2: 100%  Weight: 274 lb 9.6 oz (124.6 kg)   General Apperance: NAD HEENT: Normocephalic, atraumatic, anicteric sclera Neck: Supple, trachea midline Lungs: Clear to auscultation bilaterally. No wheezes, rhonchi or rales. Breathing comfortably Heart: Regular rate and rhythm, no murmur/rub/gallop Abdomen: Soft, nontender, nondistended, no rebound/guarding Extremities: Warm and well perfused, no edema Skin: Bilateral lower extremities with patches of hyperpigmentation of varying size, nonblanching, no erythema, nontender, xerosis Neurologic: Alert and interactive. No gross deficits.   Assessment & Plan:   See Encounters Tab for problem based charting.  Patient seen with Dr. Oswaldo Done

## 2016-10-15 NOTE — Assessment & Plan Note (Signed)
Assessment: Most likely atopic dermatitis. Has had a history of this before and responds to steroid cream. Unclear trigger  Plan: Triamcinolone 0.1% twice a day for 2-4 weeks Discussed return precautions. If rash persists, would biopsy.

## 2016-10-16 NOTE — Progress Notes (Signed)
Internal Medicine Clinic Attending  I saw and evaluated the patient.  I personally confirmed the key portions of the history and exam documented by Dr. Krall and I reviewed pertinent patient test results.  The assessment, diagnosis, and plan were formulated together and I agree with the documentation in the resident's note.  

## 2016-10-21 ENCOUNTER — Encounter: Payer: Medicaid Other | Admitting: Internal Medicine

## 2016-11-28 ENCOUNTER — Telehealth: Payer: Self-pay | Admitting: Internal Medicine

## 2016-11-28 NOTE — Telephone Encounter (Signed)
Called pt, made appt for Tuesday at her choosing

## 2016-11-28 NOTE — Telephone Encounter (Signed)
VAGINAL INFECTION NEEDS MEDICATION, [facility] [address]

## 2016-12-02 ENCOUNTER — Ambulatory Visit: Payer: 59

## 2016-12-03 ENCOUNTER — Encounter: Payer: Self-pay | Admitting: Internal Medicine

## 2016-12-03 ENCOUNTER — Ambulatory Visit: Payer: 59

## 2016-12-25 ENCOUNTER — Other Ambulatory Visit: Payer: Self-pay

## 2016-12-25 ENCOUNTER — Other Ambulatory Visit: Payer: Self-pay | Admitting: Internal Medicine

## 2016-12-25 DIAGNOSIS — N6489 Other specified disorders of breast: Secondary | ICD-10-CM

## 2016-12-26 ENCOUNTER — Encounter: Payer: Self-pay | Admitting: Internal Medicine

## 2016-12-26 ENCOUNTER — Ambulatory Visit (INDEPENDENT_AMBULATORY_CARE_PROVIDER_SITE_OTHER): Payer: 59 | Admitting: Internal Medicine

## 2016-12-26 ENCOUNTER — Other Ambulatory Visit (HOSPITAL_COMMUNITY)
Admission: RE | Admit: 2016-12-26 | Discharge: 2016-12-26 | Disposition: A | Discharge status: Home / Self Care | Payer: 59 | Source: Ambulatory Visit | Attending: Internal Medicine | Admitting: Internal Medicine

## 2016-12-26 VITALS — BP 150/91 | HR 71 | Temp 98.1°F | Ht 67.0 in | Wt 279.5 lb

## 2016-12-26 DIAGNOSIS — Z79899 Other long term (current) drug therapy: Secondary | ICD-10-CM | POA: Diagnosis not present

## 2016-12-26 DIAGNOSIS — Z90710 Acquired absence of both cervix and uterus: Secondary | ICD-10-CM

## 2016-12-26 DIAGNOSIS — I10 Essential (primary) hypertension: Secondary | ICD-10-CM | POA: Diagnosis not present

## 2016-12-26 DIAGNOSIS — N898 Other specified noninflammatory disorders of vagina: Secondary | ICD-10-CM

## 2016-12-26 DIAGNOSIS — R21 Rash and other nonspecific skin eruption: Secondary | ICD-10-CM

## 2016-12-26 DIAGNOSIS — Z87891 Personal history of nicotine dependence: Secondary | ICD-10-CM | POA: Diagnosis not present

## 2016-12-26 MED ORDER — AMLODIPINE BESYLATE 5 MG PO TABS: 5.0000 mg | ORAL_TABLET | Freq: Every day | ORAL | 1 refills | Status: DC

## 2016-12-26 MED ORDER — MUPIROCIN 2 % EX OINT: 1.0000 "application " | TOPICAL_OINTMENT | Freq: Two times a day (BID) | CUTANEOUS | 0 refills | Status: DC

## 2016-12-26 NOTE — Patient Instructions (Addendum)
I think your rash may be a skin infection of your hair follicles,  I have sent a mupirocin ointment to your pharmacy, you can also try using over the counter neosporin   I will call you with the results of your vaginal exam.   Schedule a follow up appointment with your primary care doctor in 1 month.

## 2016-12-26 NOTE — Progress Notes (Signed)
   CC: skin rash, vaginal discharge   HPI:  Stacey Price is a 41 y.o. with PMH hypertension, asthma today she presents with vaginal discharge and rash over her lower extremities.   She had scheduled this visit to discuss a right breast pain however after scheduling this appointment she was able to schedule an appointment with the breast center for next week so today she chose to discuss other concerns at the internal medicine clinic today. As I was explaining her AVS she handed me with FMLA paperwork and says shes missed work for the breast pain. She says that the pain has been worse in her right breast, it is not associated with nipple dimpling or discharge or skin changes. I was not able to fully address the breast pain or [facility] but asked her to schedule another appointment.    Past Medical History:  Diagnosis Date  . Asthma   . Hypertension   . Obesity    Review of Systems:  Please refer to history of present illness and assessment and plan her pertinent review of systems, all others reviewed and negative  Physical Exam:  Vitals:   12/26/16 0926  BP: (!) 150/91  Pulse: 71  Temp: 98.1 F (36.7 C)  TempSrc: Oral  SpO2: 100%  Weight: 279 lb 8 oz (126.8 kg)  Height: 5\' 7"  (1.702 m)   Physical Exam  Constitutional: She is oriented to person, place, and time. She appears well-developed and well-nourished. No distress.  Genitourinary: Vaginal discharge found.  Genitourinary Comments: Labia and vaginal canal without lesions or erythema, There is a white malodorous discharge in the vaginal vault. The cervix is visualized, it is not friable. No cervical motion tenderness.   Neurological: She is alert and oriented to person, place, and time.  Skin: Skin is warm and dry. She is not diaphoretic. No erythema.  Hardened whiteheads over hair follicles on bilateral lower extremities, no erythema, warmth or induration.   Psychiatric: She has a normal mood and  affect. Her behavior is normal.   Assessment & Plan:   Vaginal Discharge Malodorous vaginal discharge for the past week. She does not have pruritis, fevers or chills. She is not sexually active and is s/p total abdominal hysterectomy. This is similar in presentation to when she was diagnosed with BV in the past.  - wet mount today   Hypertension  BP Readings from Last 3 Encounters:  12/26/16 (!) 150/91  10/15/16 136/72  07/29/16 120/79  Blood pressure is not controlled today. She has been out of medications for the past month.  - Refilled amlodipine  -RTC in 1 month   Rash  Reports rash over her lower legs for years. The rash appears to be hardened white heads over the hair follicles of her lower legs, the surrounding skin is flaking and dry. These whiteheads are pruritic. Has not been using any new detergents, soaps, or clothing. The rash was made worse by steroid ointment and better with mupirocin. She ask for a refill of mupirocin today. - - -Refilled mupirocin  -encouraged regular use of emollient  -RTC in 1 month   See Encounters Tab for problem based charting.  Patient discussed with Dr. Cleda DaubE. Hoffman

## 2016-12-28 DIAGNOSIS — R21 Rash and other nonspecific skin eruption: Secondary | ICD-10-CM | POA: Insufficient documentation

## 2016-12-28 DIAGNOSIS — N898 Other specified noninflammatory disorders of vagina: Secondary | ICD-10-CM | POA: Insufficient documentation

## 2016-12-28 NOTE — Assessment & Plan Note (Signed)
Reports rash over her lower legs for years. The rash appears to be hardened white heads over the hair follicles of her lower legs, the surrounding skin is flaking and dry. These whiteheads are pruritic. Has not been using any new detergents, soaps, or clothing. The rash was made worse by steroid ointment and better with mupirocin. She ask for a refill of mupirocin today. - - -Refilled mupirocin  -encouraged regular use of emollient  -RTC in 1 month

## 2016-12-28 NOTE — Assessment & Plan Note (Signed)
  BP Readings from Last 3 Encounters:  12/26/16 (!) 150/91  10/15/16 136/72  07/29/16 120/79  Blood pressure is not controlled today. She has been out of medications for the past month.  - Refilled amlodipine  -RTC in 1 month

## 2016-12-28 NOTE — Assessment & Plan Note (Addendum)
Malodorous vaginal discharge for the past week. She does not have pruritis, fevers or chills. She is not sexually active and is s/p total abdominal hysterectomy. This is similar in presentation to when she was diagnosed with BV in the past.  - wet mount today  Addendum: Wet mount returned positive for Gardnerella. I notified the patient and called in a prescription for Flagyl 500 mg twice daily for 1 week.

## 2016-12-29 ENCOUNTER — Encounter: Payer: Self-pay | Admitting: Internal Medicine

## 2016-12-29 ENCOUNTER — Ambulatory Visit (INDEPENDENT_AMBULATORY_CARE_PROVIDER_SITE_OTHER): Payer: 59 | Admitting: Internal Medicine

## 2016-12-29 VITALS — BP 132/81 | HR 98 | Temp 98.7°F | Wt 280.7 lb

## 2016-12-29 DIAGNOSIS — N644 Mastodynia: Secondary | ICD-10-CM

## 2016-12-29 DIAGNOSIS — Z299 Encounter for prophylactic measures, unspecified: Secondary | ICD-10-CM

## 2016-12-29 DIAGNOSIS — Z23 Encounter for immunization: Secondary | ICD-10-CM | POA: Diagnosis not present

## 2016-12-29 DIAGNOSIS — N6310 Unspecified lump in the right breast, unspecified quadrant: Secondary | ICD-10-CM

## 2016-12-29 LAB — CERVICOVAGINAL ANCILLARY ONLY
CHLAMYDIA, DNA PROBE: NEGATIVE
Neisseria Gonorrhea: NEGATIVE
WET PREP (BD AFFIRM): POSITIVE — AB

## 2016-12-29 MED ORDER — METRONIDAZOLE 500 MG PO TABS: 500.0000 mg | ORAL_TABLET | Freq: Two times a day (BID) | ORAL | 0 refills | Status: DC

## 2016-12-29 NOTE — Patient Instructions (Addendum)
Stacey Price,   For your breast pain, keep your appointment at the breast center  Try evening primrose oil and vitamin E supplementation and use a sports bra with underwire.   Please schedule a follow up appointment with your primary care doctor in 1 month   Breast Cyst A breast cyst is a sac in the breast that is filled with fluid. Breast cysts are usually noncancerous (benign). They are common among women, and they are most often located in the upper, outer portion of the breast. One or more cysts may develop. They form when fluid builds up inside of the breast glands. There are several types of breast cysts:  Macrocyst. This is a cyst that is about 2 inches (5.1 cm) across (in diameter).  Microcyst. This is a very small cyst that you cannot feel, but it can be seen with imaging tests such as an X-ray of the breast (mammogram) or ultrasound.  Galactocele. This is a cyst that contains milk. It may develop if you suddenly stop breastfeeding.  Breast cysts do not increase your risk of breast cancer. They usually disappear after menopause, unless you take artificial hormones (are on hormone therapy). What are the causes? The exact cause of breast cysts is not known. Possible causes include:  Blockage of tubes (ducts) in the breast glands, which leads to fluid buildup. Duct blockage may result from: ? Fibrocystic breast changes. This is a common, benign condition that occurs when women go through hormonal changes during the menstrual cycle. This is a common cause of multiple breast cysts. ? Overgrowth of breast tissue or breast glands. ? Scar tissue in the breast from previous surgery.  Changes in certain female hormones (estrogen and progesterone).  What increases the risk? You may be more likely to develop breast cysts if you have not gone through menopause. What are the signs or symptoms? Symptoms of a breast cyst may include:  Feeling one or more smooth, round, soft lumps (like  grapes) in the breast that are easily moveable. The lump(s) may get bigger and more painful before your period and get smaller after your period.  Breast discomfort or pain.  How is this diagnosed? A cyst can be felt during a physical exam by your health care provider. A mammogram and ultrasound will be done to confirm the diagnosis. Fluid may be removed from the cyst with a needle (fine-needle aspiration) and tested to make sure the cyst is not cancerous. How is this treated? Treatment may not be necessary. Your health care provider may monitor the cyst to see if it goes away on its own. If the cyst is uncomfortable or gets bigger, or if you do not like how the cyst makes your breast look, you may need treatment. Treatment may include:  Hormone treatment.  Fine-needle aspiration, to drain fluid from the cyst. There is a chance of the cyst coming back (recurring) after aspiration.  Surgery to remove the cyst.  Follow these instructions at home:  See your health care provider regularly. ? Get a yearly physical exam. ? If you are 5420-43 years old, get a clinical breast exam every 1-3 years. After age 43, get this exam every year. ? Get mammograms as often as directed.  Do a breast self-exam every month, or as often as directed. Having many breast cysts, or "lumpy" breasts, may make it harder to feel for new lumps. Understand how your breasts normally look and feel, and write down any changes in your breasts so you  can tell your health care provider about the changes. A breast self-exam involves: ? Comparing your breasts in the mirror. ? Looking for visible changes in your skin or nipples. ? Feeling for lumps or changes.  Take over-the-counter and prescription medicines only as told by your health care provider.  Wear a supportive bra, especially when exercising.  Follow instructions from your health care provider about eating and drinking restrictions. ? Avoid caffeine. ? Cut down on  salt (sodium) in what you eat and drink, especially before your menstrual period. Too much sodium can cause fluid buildup (retention), breast swelling, and discomfort.  Keep all follow-up visits as told your health care provider. This is important. Contact a health care provider if:  You feel, or think you feel, a lump in your breast.  You notice that both breasts look or feel different than usual.  Your breast is still causing pain after your menstrual period is over.  You find new lumps or bumps that were not there before.  You feel lumps in your armpit (axilla). Get help right away if:  You have severe pain, tenderness, redness, or warmth in your breast.  You have fluid or blood leaking from your nipple.  Your breast lump becomes hard and painful.  You notice dimpling or wrinkling of the breast or nipple. This information is not intended to replace advice given to you by your health care provider. Make sure you discuss any questions you have with your health care provider. Document Released: 06/09/2005 Document Revised: 02/29/2016 Document Reviewed: 02/29/2016 Elsevier Interactive Patient Education  2017 [employer].

## 2016-12-29 NOTE — Progress Notes (Signed)
   CC: Breast pain  HPI:  [name] is a 41 y.o. with past medical history of fibrocystic breast mass, hypertension and asthma who reports for follow-up of fibrocystic breasts.  Past Medical History:  Diagnosis Date  . Asthma   . Hypertension   . Obesity    Review of Systems:  Please refer to HPI and assessment and plans tab for pertinent review of systems   Physical Exam:  Vitals:   12/29/16 1030  BP: 132/81  Pulse: 98  Temp: 98.7 F (37.1 C)  TempSrc: Oral  SpO2: 100%  Weight: 280 lb 11.2 oz (127.3 kg)   Physical Exam  Constitutional: She is well-developed, well-nourished, and in no distress. No distress.  HENT:  Head: Normocephalic and atraumatic.  Eyes: Conjunctivae are normal. Right eye exhibits no discharge. Left eye exhibits no discharge. No scleral icterus.  Neck: Normal range of motion.  Skin: She is not diaphoretic.  Breast: tenderness to palpation over chest wall caudal and axillary to breast tissue. Tenderness to palpation and small nodules palpable at 5 oclock and 7 o' clock position of right breast. No nipple dimpling, discharge, or skin changes. No tenderness to palpation of the left breast, no nodularity found on the left breast.   Assessment & Plan:   Breast pain Patient presents with progressively worsening right breast pain the past month. The pain started as a tingling at first and has progressed to the pain that feels like a "vice grip" he has tried warm compresses, Tylenol, aspirin, aleve, and had minimal relief of symptoms. The pain comes and goes throughout the day and sometimes it is throbbing and then a considerably become excruciating. This has limited her ability to sit at her desk at work. She had a diagnostic mammogram 08/2015 of the right breast which showed hypoechoic mass consistent with a complicated cyst. Follow-up mammogram in October 2017 showed resolution of some of the cystic sit tissue however other parts were stable with  superimposed normal dense fibroglandular tissue and ductal ectasia. She has been avoiding support bras and wearing sports bras.   I believe her breast pain is related to Cooper's ligament strain and cystic pain. She has a follow-up mammogram at the breast center this Friday which will evaluate for any progression of her fibrocystic disease. -Recommended the use of supportive sports bra with underwire -Recommended conservative management with evening primrose oil and vitamin E, this has not helped to relieve her pain can consider trial of diclofenac gel - RTC in 1 month   See Encounters Tab for problem based charting.  Patient discussed with Dr. Criselda PeachesMullen

## 2016-12-29 NOTE — Assessment & Plan Note (Signed)
Patient presents with progressively worsening right breast pain the past month. The pain started as a tingling at first and has progressed to the pain that feels like a "vice grip" he has tried warm compresses, Tylenol, aspirin, aleve, and had minimal relief of symptoms. The pain comes and goes throughout the day and sometimes it is throbbing and then a considerably become excruciating. This has limited her ability to sit at her desk at work. She had a diagnostic mammogram 08/2015 of the right breast which showed hypoechoic mass consistent with a complicated cyst. Follow-up mammogram in October 2017 showed resolution of some of the cystic sit tissue however other parts were stable with superimposed normal dense fibroglandular tissue and ductal ectasia. She has been avoiding support bras and wearing sports bras.   I believe her breast pain is related to Cooper's ligament strain and cystic pain. She has a follow-up mammogram at the breast center this Friday which will evaluate for any progression of her fibrocystic disease. -Recommended the use of supportive sports bra with underwire -Recommended conservative management with evening primrose oil and vitamin E, this has not helped to relieve her pain can consider trial of diclofenac gel - RTC in 1 month

## 2016-12-29 NOTE — Addendum Note (Signed)
Addended by: Earl LagosBLUM, Srihari Shellhammer S on: 12/29/2016 05:35 PM   Modules accepted: Orders

## 2016-12-30 NOTE — Progress Notes (Signed)
Internal Medicine Clinic Attending  Case discussed with Dr. Blum at the time of the visit.  We reviewed the resident's history and exam and pertinent patient test results.  I agree with the assessment, diagnosis, and plan of care documented in the resident's note. 

## 2017-01-02 ENCOUNTER — Ambulatory Visit
Admission: RE | Admit: 2017-01-02 | Discharge: 2017-01-02 | Disposition: A | Discharge status: Home / Self Care | Payer: 59 | Source: Ambulatory Visit | Attending: Internal Medicine | Admitting: Internal Medicine

## 2017-01-02 ENCOUNTER — Ambulatory Visit: Admission: RE | Admit: 2017-01-02 | Payer: 59 | Source: Ambulatory Visit

## 2017-01-02 DIAGNOSIS — R922 Inconclusive mammogram: Secondary | ICD-10-CM | POA: Diagnosis not present

## 2017-01-02 DIAGNOSIS — N6489 Other specified disorders of breast: Secondary | ICD-10-CM

## 2017-01-09 ENCOUNTER — Telehealth: Payer: Self-pay

## 2017-01-09 NOTE — Telephone Encounter (Signed)
Needs to speak with a nurse about pain meds.  

## 2017-01-09 NOTE — Telephone Encounter (Signed)
Lm for rtc 

## 2017-01-12 ENCOUNTER — Ambulatory Visit (INDEPENDENT_AMBULATORY_CARE_PROVIDER_SITE_OTHER): Payer: 59 | Admitting: Internal Medicine

## 2017-01-12 DIAGNOSIS — N644 Mastodynia: Secondary | ICD-10-CM

## 2017-01-12 DIAGNOSIS — Z87891 Personal history of nicotine dependence: Secondary | ICD-10-CM

## 2017-01-12 DIAGNOSIS — L309 Dermatitis, unspecified: Secondary | ICD-10-CM

## 2017-01-12 DIAGNOSIS — Z803 Family history of malignant neoplasm of breast: Secondary | ICD-10-CM

## 2017-01-12 DIAGNOSIS — L509 Urticaria, unspecified: Secondary | ICD-10-CM

## 2017-01-12 MED ORDER — DICLOFENAC SODIUM 1 % TD GEL: 2.0000 g | Freq: Four times a day (QID) | TRANSDERMAL | 0 refills | Status: DC

## 2017-01-12 MED ORDER — LORATADINE 10 MG PO TABS: 10.0000 mg | ORAL_TABLET | Freq: Every day | ORAL | 2 refills | Status: DC | PRN

## 2017-01-12 NOTE — Assessment & Plan Note (Signed)
Patient continues to experience right breast pain. She has been trying vitamin E and supportive bras with underwire. The bras with underwire are uncomfortable and have not worked to relieve her pain.  Diagnostic mammogram 7/13 showed resolution of prior asymmetry in the upper outer right breast and no findings suspicious for malignancy. Her breast pain may be musculoskeletal.  - trial of diclofenac cream  - continue vitamin E  - consider repeat screening mammogram in 1 year

## 2017-01-12 NOTE — Assessment & Plan Note (Signed)
Patient has a rash which began last night. The rash is itchy and non painful. She has had a similar rash in the past ( last was around November) , it usually resolves on its own. She has not used any new detergents or body products. She does not recall eating any new foods. She is wondering if she may be related to the false hair that she uses because previously she was wearing it up but she began wearing it down yesterday. Has not tried anything to relieve the itching.  On exam the rash looks like hives.  - trial of claritin or zyrtec  - asked to keep a food diary

## 2017-01-12 NOTE — Patient Instructions (Addendum)
[name],   For your breast pain,  Try using this diclofenac cream   For your rash,  Start keeping a food diary  Start taking Claritin or Zyrtec   Schedule a follow up appointment in the clinic in 3 months   Call us if you need anything

## 2017-01-12 NOTE — Progress Notes (Signed)
   CC: rash  HPI:  [name] is a 41 y.o. with PMH as listed below who presents for follow up breast pain and acute concern of an itchy rash. Please see the assessment and plans for the status of the patient chronic medical problems.   Past Medical History:  Diagnosis Date  . Asthma   . Hypertension   . Obesity    Review of Systems:  Refer to history of present illness and assessment and plans for pertinent review of systems, all others reviewed and negative  Physical Exam:  Vitals:   01/12/17 1536  BP: 133/79  Pulse: 67  Temp: 98 F (36.7 C)  TempSrc: Oral  SpO2: 100%  Weight: 284 lb 4.8 oz (129 kg)  Height: 5\' 7"  (1.702 m)   Physical Exam  Constitutional: She is well-developed, well-nourished, and in no distress. No distress.  HENT:  Head: Normocephalic and atraumatic.  Skin: Skin is warm and dry. She is not diaphoretic.  Multiple macules, one over the right breast, a few over the left upper arm, few behind the left ear and a pealing rash behind the left ear      Assessment & Plan:   Rash  Patient has a rash which began last night. The rash is itchy and non painful. She has had a similar rash in the past ( last was around November) , it usually resolves on its own. She has not used any new detergents or body products. She does not recall eating any new foods. She is wondering if she may be related to the false hair that she uses because previously she was wearing it up but she began wearing it down yesterday. Has not tried anything to relieve the itching.  On exam the rash looks like hives.  - trial of claritin or zyrtec  - asked to keep a food diary   Breast pain  Patient continues to experience right breast pain. She has been trying vitamin E and supportive bras with underwire. The bras with underwire are uncomfortable and have not worked to relieve her pain.  Diagnostic mammogram 7/13 showed resolution of prior asymmetry in the upper outer right breast and  no findings suspicious for malignancy. Her breast pain may be musculoskeletal.  - trial of diclofenac cream  - continue vitamin E  - consider repeat screening mammogram in 1 year   See Encounters Tab for problem based charting.  Patient seen with Dr. Criselda PeachesMullen

## 2017-01-13 NOTE — Progress Notes (Signed)
Internal Medicine Clinic Attending  I saw and evaluated the patient.  I personally confirmed the key portions of the history and exam documented by Dr. Obie DredgeBlum and I reviewed pertinent patient test results.  The assessment, diagnosis, and plan were formulated together and I agree with the documentation in the resident's note.  The rash was very mild, but very itchy to patient.  I agree with attempting allergic treatment and for her to keep track of food to see if there is an obvious trigger.  Could also consider hair treatments, lotions, soaps and detergents if not improved.

## 2017-01-20 ENCOUNTER — Encounter: Payer: Self-pay | Admitting: Internal Medicine

## 2017-01-20 ENCOUNTER — Ambulatory Visit (INDEPENDENT_AMBULATORY_CARE_PROVIDER_SITE_OTHER): Payer: 59 | Admitting: Internal Medicine

## 2017-01-20 VITALS — BP 138/82 | Temp 98.5°F | Wt 289.0 lb

## 2017-01-20 DIAGNOSIS — E669 Obesity, unspecified: Secondary | ICD-10-CM | POA: Diagnosis not present

## 2017-01-20 DIAGNOSIS — Z87891 Personal history of nicotine dependence: Secondary | ICD-10-CM

## 2017-01-20 DIAGNOSIS — Z803 Family history of malignant neoplasm of breast: Secondary | ICD-10-CM | POA: Diagnosis not present

## 2017-01-20 DIAGNOSIS — IMO0001 Reserved for inherently not codable concepts without codable children: Secondary | ICD-10-CM

## 2017-01-20 DIAGNOSIS — Z8249 Family history of ischemic heart disease and other diseases of the circulatory system: Secondary | ICD-10-CM | POA: Diagnosis not present

## 2017-01-20 DIAGNOSIS — N644 Mastodynia: Secondary | ICD-10-CM

## 2017-01-20 DIAGNOSIS — Z6841 Body Mass Index (BMI) 40.0 and over, adult: Secondary | ICD-10-CM | POA: Diagnosis not present

## 2017-01-20 DIAGNOSIS — I10 Essential (primary) hypertension: Secondary | ICD-10-CM | POA: Diagnosis not present

## 2017-01-20 MED ORDER — MELOXICAM 7.5 MG PO TABS: 7.5000 mg | ORAL_TABLET | Freq: Two times a day (BID) | ORAL | 1 refills | Status: DC | PRN

## 2017-01-20 NOTE — Assessment & Plan Note (Signed)
Patient notes recent weight gain, which has also contributed to increasing breast size over the past two years. She was briefly counseled about weight management strategies and referred to the clinic dietician for counseling regarding dietary weight loss strategies.   Plan: -Refer for medical nutritional therapy -Reassess at follow up

## 2017-01-20 NOTE — Assessment & Plan Note (Addendum)
The patient has tried multiple interventions without success and has been seen in the clinic for this problem on two other occasions. The patient's mammogram was reassuring that she does not have any underlying masses that could be causing her pain. This is consistent with her exam. 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   Per literature review, the patient would likely have improvement with more supportive undergarments. She was instructed to begin wearing sports bras with large straps that can provide more support. She was also instructed to continue taking vitamin E supplements and primrose evening oil if it helps her. She will start taking meloxicam 7.5 mg once a day for pain management. She can increase to twice a day if needed. She was also instructed to use Voltaren gel 4 times a day and to massage it into the right upper outer quadrant of her right breast. Per literature review a regimen of NSAIDs and Voltaren gel was better than NSAIDs alone for treatment of non-cyclical mastalgia.   The patient was instructed to return to clinic in 1-2 weeks for re-evaluation to see if meloxicam, voltaren gel, and change of undergarment have helped. If these interventions have not helped, a trial of oral contraceptives could be initiated to see if these medications help with her pain. At that time a consult to a breast surgeon regarding breast reduction can also be considered.  Plan: -Start wearing sportsbra with wide straps for more support -Start meloxicam 7.5mg  up to 2 times a day if needed; apply Voltaren gel 4 times a day if needed -Consider trial of OCPs at next visit and discuss consult with breast surgeon if pain not controlled or improved.

## 2017-01-20 NOTE — Patient Instructions (Addendum)
Thanks for seeing us today!  Please START taking Meloxicam, 7.5 mg. You will can start taking one tablet per day. You can increase to two tablets per day if needed.   Please CONTINUE using voltaran gel up to 4 times a day for breast pain. Please START wearing sports bras.  Return to clinic in 1-2 weeks if your symptoms do not improve. Call if you have any problems or want to be seen before this.

## 2017-01-20 NOTE — Progress Notes (Signed)
   CC: R Breast pain follow up  HPI:  [name] is a 41 y.o. with a PMH of HTN and obesity who is presenting today with a 1 month history of right breast pain. She has been evaluated for this pain twice in the clinic over the course of this month. She had a recent diagnostic mammogram on 01/02/17 that showed resolution of previously observed right upper, outer quadrant fibroglandular densities. This new study was labeled BIRADS 1.   On previous visits the patient was instructed to wear an underwire bra, start taking Vitamin E and evening primrose oil, and to start using diclofenac gel. She has not taken any OTC medications in addition to supplements suggested by the physician. She has been using the Voltaren gel two times a day instead of 4 times a day. She states that none of these interventions have helped. Today she continuous to endorse severe pain on the right upper outer quadrant of her right breast. She states that since changing to under wire bras that her pain has worsened. She has not found that anything makes the pain better and states that it does not change during the day. It has also not stopped hurting since the beginning of the month.  Past Medical History:  Diagnosis Date  . Asthma   . Hypertension   . Obesity    Review of Systems:   Patient endorses right breast pain, as per HPI Patient denies chest pain, shortness of breath, abdominal pain, diaphoresis, nausea/vomiting, lower extremity swelling, and change in bowel/bladder habits.  Physical Exam:  Vitals:   01/20/17 1417  BP: 138/82  Temp: 98.5 F (36.9 C)  TempSrc: Oral  SpO2: 100%  Weight: 289 lb (131.1 kg)   Physical Exam  Constitutional: She appears well-developed and well-nourished. No distress.  Cardiovascular: Normal rate, regular rhythm, normal heart sounds and intact distal pulses.  Exam reveals no friction rub.   No murmur heard. Pulmonary/Chest: Effort normal and breath sounds normal. No  respiratory distress. She has no wheezes.  Abdominal: Soft. She exhibits no distension. There is no tenderness.  Musculoskeletal: She exhibits no edema (of R or L breast).  The patient has large breasts without overlying skin changes, erythema, or nipple inversion. No nipple discharge. There is tenderness to palpation of right upper, out quadrant (9-12 o'clock) of right breast. No tenderness to palpation of left breast. No masses or fluid collections appreciated bilaterally. Tenderness to palpation of R axilla. No lymphadenopathy appreciated bilaterally.  Skin: Skin is warm and dry. Capillary refill takes less than 2 seconds. No rash noted. No erythema.  Psychiatric: She has a normal mood and affect. Her behavior is normal. Thought content normal.   Assessment & Plan:   See Encounters Tab for problem based charting.  Patient seen with Dr. Criselda PeachesMullen.

## 2017-01-23 NOTE — Progress Notes (Signed)
Internal Medicine Clinic Attending  I saw and evaluated the patient.  I personally confirmed the key portions of the history and exam documented by Dr. Saunders RevelNedrud and I reviewed pertinent patient test results.  The assessment, diagnosis, and plan were formulated together and I agree with the documentation in the resident's note.  We also discussed limiting caffeine.  This intervention has limited utility, but as the patient is having such severe symptoms, it seems we should try any option for her.

## 2017-01-29 ENCOUNTER — Emergency Department (HOSPITAL_BASED_OUTPATIENT_CLINIC_OR_DEPARTMENT_OTHER)
Admission: EM | Admit: 2017-01-29 | Discharge: 2017-01-29 | Disposition: A | Discharge status: Home / Self Care | Payer: 59 | Attending: Emergency Medicine | Admitting: Emergency Medicine

## 2017-01-29 ENCOUNTER — Encounter (HOSPITAL_BASED_OUTPATIENT_CLINIC_OR_DEPARTMENT_OTHER): Payer: Self-pay | Admitting: Emergency Medicine

## 2017-01-29 DIAGNOSIS — F17200 Nicotine dependence, unspecified, uncomplicated: Secondary | ICD-10-CM | POA: Insufficient documentation

## 2017-01-29 DIAGNOSIS — I10 Essential (primary) hypertension: Secondary | ICD-10-CM | POA: Diagnosis not present

## 2017-01-29 DIAGNOSIS — Z79899 Other long term (current) drug therapy: Secondary | ICD-10-CM | POA: Diagnosis not present

## 2017-01-29 DIAGNOSIS — J45909 Unspecified asthma, uncomplicated: Secondary | ICD-10-CM | POA: Diagnosis not present

## 2017-01-29 DIAGNOSIS — G8929 Other chronic pain: Secondary | ICD-10-CM

## 2017-01-29 DIAGNOSIS — N644 Mastodynia: Secondary | ICD-10-CM | POA: Insufficient documentation

## 2017-01-29 NOTE — ED Triage Notes (Signed)
R breast pain x several months. Seen by PCP multiple times most recently last week. Pt started on meloxicam without relief. Pt was instructed to return to the office for worsening symptoms but states she couldn't get an appt.

## 2017-01-29 NOTE — ED Provider Notes (Signed)
[facility]   CSN: [id number] Arrival date & time: 01/29/17  1603     History   Chief Complaint Chief Complaint  Patient presents with  . Breast Pain    HPI Stacey Price is a 41 y.o. female.  Patient is a 41 yo F with ongiong R breast pain over the last month with several visits to her PCP, last on 01/20/17. States she is in the ED today because her R breast mass has been steadily worsening over the last few days and she was not able to get an appointment in PCP office today. No fever/chills, discharge, chest pain, SOB, abd pain, n/v.      Past Medical History:  Diagnosis Date  . Asthma   . Hypertension   . Obesity     Patient Active Problem List   Diagnosis Date Noted  . Obesity 01/20/2017  . Rash and nonspecific skin eruption 12/28/2016  . Vaginal discharge 12/28/2016  . Allergic rhinitis 10/15/2016  . Pilonidal cyst 04/15/2016  . Breast skin changes 04/15/2016  . HTN (hypertension) 10/12/2015  . Asthma 10/12/2015  . Hives 10/12/2015  . Breast pain 10/12/2015  . Tinea versicolor 10/12/2015    Past Surgical History:  Procedure Laterality Date  . ABDOMINAL HYSTERECTOMY  2009   [facility]   . CESAREAN SECTION     1995-2000    OB History    No data available       Home Medications    Prior to Admission medications   Medication Sig Start Date End Date Taking? Authorizing Provider  FLUoxetine (PROZAC) 20 MG tablet Take 20 mg by mouth daily.   Yes [provider]  traZODone (DESYREL) 50 MG tablet Take 50 mg by mouth at bedtime.   Yes [provider]  albuterol (PROVENTIL HFA;VENTOLIN HFA) 108 (90 Base) MCG/ACT inhaler Inhale into the lungs every 6 (six) hours as needed for wheezing or shortness of breath.    [provider]  amLODipine (NORVASC) 5 MG tablet Take 1 tablet (5 mg total) by mouth daily. 12/26/16   Eulah Pont, MD  cetirizine (ZYRTEC) 10 MG tablet Take 1 tablet (10 mg total) by mouth daily.  10/15/16   Lora Paula, MD  diclofenac sodium (VOLTAREN) 1 % GEL Apply 2 g topically 4 (four) times daily. 01/12/17   Eulah Pont, MD  Fluticasone-Salmeterol (ADVAIR) 250-50 MCG/DOSE AEPB Inhale 1 puff into the lungs 2 (two) times daily.    [provider]  ibuprofen (ADVIL,MOTRIN) 800 MG tablet Take 1 tablet (800 mg total) by mouth every 8 (eight) hours as needed. 04/14/16   John Giovanni, MD  loratadine (CLARITIN) 10 MG tablet Take 1 tablet (10 mg total) by mouth daily as needed for allergies. 01/12/17 01/12/18  Eulah Pont, MD  meloxicam (MOBIC) 7.5 MG tablet Take 1 tablet (7.5 mg total) by mouth 2 (two) times daily as needed for pain. 01/20/17   Rozann Lesches, MD  metroNIDAZOLE (FLAGYL) 500 MG tablet Take 1 tablet (500 mg total) by mouth 2 (two) times daily. 12/29/16   Eulah Pont, MD  mupirocin ointment (BACTROBAN) 2 % Place 1 application into the nose 2 (two) times daily. 12/26/16   Eulah Pont, MD  naproxen (NAPROSYN) 500 MG tablet Take 1 tablet (500 mg total) by mouth 2 (two) times daily. 07/29/16   Mabe, Latanya Maudlin, MD    Family History Family History  Problem Relation Age of Onset  . Hypertension Mother   . Hypertension Father   .  Breast cancer Cousin     Social History Social History  Substance Use Topics  . Smoking status: Former Smoker    Quit date: 11/23/2014  . Smokeless tobacco: Never Used  . Alcohol use Yes     Comment: occ     Allergies   Patient has no known allergies.   Review of Systems Review of Systems  Constitutional: Negative for chills and fever.  Respiratory: Negative for shortness of breath.   Cardiovascular: Negative for chest pain.  Gastrointestinal: Negative for abdominal pain, diarrhea, nausea and vomiting.  Genitourinary: Negative for dysuria.  Skin: Negative for color change, rash and wound.     Physical Exam Updated Vital Signs BP 135/84 (BP Location: Left Arm)   Pulse 82   Temp 99 F (37.2 C) (Oral)   Resp 18   SpO2 97%    Physical Exam  Constitutional: She is oriented to person, place, and time. She appears well-developed and well-nourished. No distress.  HENT:  Head: Normocephalic and atraumatic.  Nose: Nose normal.  Eyes: Conjunctivae and EOM are normal.  Neck: Normal range of motion. Neck supple.  Cardiovascular: Normal rate, regular rhythm and normal heart sounds.   No murmur heard. Pulmonary/Chest: Effort normal and breath sounds normal. No respiratory distress.  Abdominal: Soft. Bowel sounds are normal. She exhibits no distension. There is no tenderness. There is no rebound and no guarding.  Musculoskeletal: She exhibits tenderness (R breast TTP anterior over 12'oclock, no masses, no rashes, discharge.).  Neurological: She is alert and oriented to person, place, and time.  Skin: Skin is warm and dry. Capillary refill takes less than 2 seconds. No rash noted.  Psychiatric: She has a normal mood and affect.     ED Treatments / Results  Labs (all labs ordered are listed, but only abnormal results are displayed) Labs Reviewed - No data to display  EKG  EKG Interpretation None       Radiology No results found.  Procedures Procedures (including critical care time)  Medications Ordered in ED Medications - No data to display   Initial Impression / Assessment and Plan / ED Course  I have reviewed the triage vital signs and the nursing notes.  Pertinent labs & imaging results that were available during my care of the patient were reviewed by me and considered in my medical decision making (see chart for details).   Patient is a 41yo F with ongoing R breast pain over the last 1 month and has had extensive work up by PCP. No signs or symptoms of infection on history or exam. Patient instructed with follow up with PCP as planned.   Final Clinical Impressions(s) / ED Diagnoses   Final diagnoses:  Chronic breast pain    New Prescriptions Discharge Medication List as of 01/29/2017  4:55  PM       Leland HerYoo, Renessa Wellnitz J, DO 01/29/17 1718    Tilden Fossaees, Elizabeth, MD 01/31/17 [phone]

## 2017-01-29 NOTE — Discharge Instructions (Addendum)
Please continue meloxicam and diclonefac gel. You can also use tylenol on top of this and do warm compresses or ice for acute pain. You need to follow up with your primary care doctor for the next steps.

## 2017-02-09 ENCOUNTER — Ambulatory Visit (INDEPENDENT_AMBULATORY_CARE_PROVIDER_SITE_OTHER): Payer: 59 | Admitting: Internal Medicine

## 2017-02-09 VITALS — BP 130/76 | HR 68 | Temp 98.1°F | Ht 67.0 in | Wt 284.4 lb

## 2017-02-09 DIAGNOSIS — G44209 Tension-type headache, unspecified, not intractable: Secondary | ICD-10-CM | POA: Insufficient documentation

## 2017-02-09 DIAGNOSIS — R040 Epistaxis: Secondary | ICD-10-CM

## 2017-02-09 LAB — PROTIME-INR
INR: 1.05
Prothrombin Time: 13.8 seconds (ref 11.4–15.2)

## 2017-02-09 NOTE — Patient Instructions (Signed)
Nosebleed, Adult A nosebleed is when blood comes out of the nose. Nosebleeds are common. Usually, they are not a sign of a serious condition. Nosebleeds can happen if a small blood vessel in your nose starts to bleed or if the lining of your nose (mucous membrane) cracks. They are commonly caused by:  Allergies.  Colds.  Picking your nose.  Blowing your nose too hard.  An injury from sticking an object into your nose or getting hit in the nose.  Dry or cold air.  Less common causes of nosebleeds include:  Toxic fumes.  Something abnormal in the nose or in the air-filled spaces in the bones of the face (sinuses).  Growths in the nose, such as polyps.  Medicines or conditions that cause blood to clot slowly.  Certain illnesses or procedures that irritate or dry out the nasal passages.  Follow these instructions at home: When you have a nosebleed:  Sit down and tilt your head slightly forward.  Use a clean towel or tissue to pinch your nostrils under the bony part of your nose. After 10 minutes, let go of your nose and see if bleeding starts again. Do not release pressure before that time. If there is still bleeding, repeat the pinching and holding for 10 minutes until the bleeding stops.  Do not place tissues or gauze in the nose to stop bleeding.  Avoid lying down and avoid tilting your head backward. That may make blood collect in the throat and cause gagging or coughing.  Use a nasal spray decongestant to help with a nosebleed as told by your health care provider.  Do not use petroleum jelly or mineral oil in your nose. It can drip into your lungs. After a nosebleed:  Avoid blowing your nose or sniffing for a number of hours.  Avoid straining, lifting, or bending at the waist for several days. You may resume other normal activities as you are able.  Use saline spray or a humidifier as told by your health care provider.  Aspirinand blood thinners make bleeding more  likely. If you are prescribed these medicines and you suffer from nosebleeds: ? Ask your health care provider if you should stop taking the medicines or if you should adjust the dose. ? Do not stop taking medicines that your health care provider has recommended unless told by your health care provider.  If your nosebleed was caused by dry mucous membranes, use over-the-counter saline nasal spray or gel. This will keep the mucous membranes moist and allow them to heal. If you must use a lubricant: ? Choose one that is water-soluble. ? Use only as much as you need and use it only as often as needed. ? Do not lie down until several hours after you use it. Contact a health care provider if:  You have a fever.  You get nosebleeds often or more often than usual.  You bruise very easily.  You have a nosebleed from having something stuck in your nose.  You have bleeding in your mouth.  You vomit or cough up brown material.  You have a nosebleed after you start a new medicine. Get help right away if:  You have a nosebleed after a fall or a head injury.  Your nosebleed does not go away after 20 minutes.  You feel dizzy or weak.  You have unusual bleeding from other parts of your body.  You have unusual bruising on other parts of your body.  You become sweaty.    You vomit blood. This information is not intended to replace advice given to you by your health care provider. Make sure you discuss any questions you have with your health care provider. Document Released: 03/19/2005 Document Revised: 02/07/2016 Document Reviewed: 12/25/2015 Elsevier Interactive Patient Education  2018 [employer]    Neck Exercises Neck exercises can be important for many reasons:  They can help you to improve and maintain flexibility in your neck. This can be especially important as you age.  They can help to make your neck stronger. This can make movement easier.  They can reduce or prevent neck  pain.  They may help your upper back.  Ask your health care provider which neck exercises would be best for you. Exercises Neck Press Repeat this exercise 10 times. Do it first thing in the morning and right before bed or as told by your health care provider. 1. Lie on your back on a firm bed or on the floor with a pillow under your head. 2. Use your neck muscles to push your head down on the pillow and straighten your spine. 3. Hold the position as well as you can. Keep your head facing up and your chin tucked. 4. Slowly count to 5 while holding this position. 5. Relax for a few seconds. Then repeat.  Isometric Strengthening Do a full set of these exercises 2 times a day or as told by your health care provider. 1. Sit in a supportive chair and place your hand on your forehead. 2. Push forward with your head and neck while pushing back with your hand. Hold for 10 seconds. 3. Relax. Then repeat the exercise 3 times. 4. Next, do thesequence again, this time putting your hand against the back of your head. Use your head and neck to push backward against the hand pressure. 5. Finally, do the same exercise on either side of your head, pushing sideways against the pressure of your hand.  Prone Head Lifts Repeat this exercise 5 times. Do this 2 times a day or as told by your health care provider. 1. Lie face-down, resting on your elbows so that your chest and upper back are raised. 2. Start with your head facing downward, near your chest. Position your chin either on or near your chest. 3. Slowly lift your head upward. Lift until you are looking straight ahead. Then continue lifting your head as far back as you can stretch. 4. Hold your head up for 5 seconds. Then slowly lower it to your starting position.  Supine Head Lifts Repeat this exercise 8-10 times. Do this 2 times a day or as told by your health care provider. 1. Lie on your back, bending your knees to point to the ceiling and  keeping your feet flat on the floor. 2. Lift your head slowly off the floor, raising your chin toward your chest. 3. Hold for 5 seconds. 4. Relax and repeat.  Scapular Retraction Repeat this exercise 5 times. Do this 2 times a day or as told by your health care provider. 1. Stand with your arms at your sides. Look straight ahead. 2. Slowly pull both shoulders backward and downward until you feel a stretch between your shoulder blades in your upper back. 3. Hold for 10-30 seconds. 4. Relax and repeat.  Contact a health care provider if:  Your neck pain or discomfort gets much worse when you do an exercise.  Your neck pain or discomfort does not improve within 2 hours after you exercise.  If you have any of these problems, stop exercising right away. Do not do the exercises again unless your health care provider says that you can. Get help right away if:  You develop sudden, severe neck pain. If this happens, stop exercising right away. Do not do the exercises again unless your health care provider says that you can. Exercises Neck Stretch  Repeat this exercise 3-5 times. 1. Do this exercise while standing or while sitting in a chair. 2. Place your feet flat on the floor, shoulder-width apart. 3. Slowly turn your head to the right. Turn it all the way to the right so you can look over your right shoulder. Do not tilt or tip your head. 4. Hold this position for 10-30 seconds. 5. Slowly turn your head to the left, to look over your left shoulder. 6. Hold this position for 10-30 seconds.  Neck Retraction Repeat this exercise 8-10 times. Do this 3-4 times a day or as told by your health care provider. 1. Do this exercise while standing or while sitting in a sturdy chair. 2. Look straight ahead. Do not bend your neck. 3. Use your fingers to push your chin backward. Do not bend your neck for this movement. Continue to face straight ahead. If you are doing the exercise properly, you will  feel a slight sensation in your throat and a stretch at the back of your neck. 4. Hold the stretch for 1-2 seconds. Relax and repeat.  This information is not intended to replace advice given to you by your health care provider. Make sure you discuss any questions you have with your health care provider. Document Released: 05/21/2015 Document Revised: 11/15/2015 Document Reviewed: 12/18/2014 Elsevier Interactive Patient Education  2017 [employer].

## 2017-02-10 ENCOUNTER — Encounter: Payer: Self-pay | Admitting: Internal Medicine

## 2017-02-10 LAB — CBC
HEMATOCRIT: 38.6 % (ref 34.0–46.6)
HEMOGLOBIN: 13.1 g/dL (ref 11.1–15.9)
MCH: 30.5 pg (ref 26.6–33.0)
MCHC: 33.9 g/dL (ref 31.5–35.7)
MCV: 90 fL (ref 79–97)
Platelets: 241 10*3/uL (ref 150–379)
RBC: 4.29 x10E6/uL (ref 3.77–5.28)
RDW: 14.1 % (ref 12.3–15.4)
WBC: 7.3 10*3/uL (ref 3.4–10.8)

## 2017-02-10 LAB — CMP14 + ANION GAP
ALK PHOS: 61 IU/L (ref 39–117)
ALT: 14 IU/L (ref 0–32)
ANION GAP: 13 mmol/L (ref 10.0–18.0)
AST: 15 IU/L (ref 0–40)
Albumin/Globulin Ratio: 1.4 (ref 1.2–2.2)
Albumin: 4 g/dL (ref 3.5–5.5)
BUN/Creatinine Ratio: 12 (ref 9–23)
BUN: 10 mg/dL (ref 6–24)
Bilirubin Total: 0.6 mg/dL (ref 0.0–1.2)
CO2: 22 mmol/L (ref 20–29)
CREATININE: 0.85 mg/dL (ref 0.57–1.00)
Calcium: 9.1 mg/dL (ref 8.7–10.2)
Chloride: 105 mmol/L (ref 96–106)
GFR calc Af Amer: 98 mL/min/{1.73_m2} (ref >59)
GFR calc non Af Amer: 85 mL/min/{1.73_m2} (ref >59)
Globulin, Total: 2.9 g/dL (ref 1.5–4.5)
Glucose: 81 mg/dL (ref 65–99)
Potassium: 4.1 mmol/L (ref 3.5–5.2)
Sodium: 140 mmol/L (ref 134–144)
Total Protein: 6.9 g/dL (ref 6.0–8.5)

## 2017-02-10 NOTE — Assessment & Plan Note (Addendum)
Assessment Patient reports having history of "migraine" headaches. States her headaches have been worse for the past few months. She feels tightness in her neck muscles and pain in the back of her head which radiates to the top of her head. States the headaches are worse when laughing or bending over. Patient is not on any OCPs. Denies having any fevers, chills, nausea, and vomiting. States the headaches do not wake her up from sleep. Headaches are not associated with an aura. They are associated with blurry vision, photophobia, and tightness of neck muscles. States she has tried over-the-counter Excedrin and other pain relievers which are not helping. Neuro exam non-focal but noted to have left-sided neck muscle tightness/discomfort on palpation. Her headaches seem to be consistent with tension headaches but she does have symptoms of migraine headaches as well.  Plan -Neck stretching exercises -Over-the-counter Tylenol/ ibuprofen

## 2017-02-10 NOTE — Progress Notes (Signed)
   CC: patient is complaining of nosebleeds and headaches.  HPI:  StaceyStacey Price is a 41 y.o. female with a past medical history of conditions listed below presenting to the clinic complaining of nosebleeds and headaches. Please see problem based charting for the status of the patient's current and chronic medical conditions.   Past Medical History:  Diagnosis Date  . Asthma   . Hypertension   . Obesity    Review of Systems: Pertinent positives mentioned in HPI. Remainder of all ROS negative.   Physical Exam:  Vitals:   02/09/17 1523  BP: 130/76  Pulse: 68  Temp: 98.1 F (36.7 C)  TempSrc: Oral  SpO2: 100%  Weight: 284 lb 6.4 oz (129 kg)  Height: 5\' 7"  (1.702 m)   Physical Exam  Constitutional: She is oriented to person, place, and time. She appears well-developed and well-nourished. No distress.  HENT:  Head: Normocephalic and atraumatic.  Mouth/Throat: Oropharynx is clear and moist.  Clotted blood noted in the inferior nasal turbinate of the left nasal passage. No blood noted in the right nasal passage.  Eyes: Pupils are equal, round, and reactive to light. EOM are normal. Right eye exhibits no discharge. Left eye exhibits no discharge.  Neck: Normal range of motion.  Cardiovascular: Normal rate, regular rhythm and intact distal pulses.   Pulmonary/Chest: Effort normal and breath sounds normal. No respiratory distress. She has no wheezes. She has no rales.  Abdominal: Soft. Bowel sounds are normal. She exhibits no distension. There is no tenderness.  Musculoskeletal: She exhibits no edema.  Left-sided neck muscle tightness/ discomfort on palpation.  Neurological: She is alert and oriented to person, place, and time. No cranial nerve deficit. She exhibits normal muscle tone. Coordination normal.  Strength 5 out of 5 and sensation to light touch intact in bilateral upper and lower extremities.  Skin: Skin is warm and dry.  Psychiatric: Her behavior is normal.     Assessment & Plan:   See Encounters Tab for problem based charting.  Patient seen with Dr. Cleda Daub

## 2017-02-10 NOTE — Assessment & Plan Note (Signed)
Assessment Patient is presenting with a 3 day history of epistaxis. States blood "pours" out of the left side of her nose every day; 2-3 times a day. States it takes 15 minutes after she applies pressure for the bleeding to stop. Denies having any trauma to her nose. Denies picking her nose. Denies any prior history of nosebleeds. Denies cocaine use. Denies having any symptoms of allergy. She does not use a nasal corticosteroid. Does state that when she went to a plasma center recently she was told that her blood "proteins" were low. It is unclear what is causing the patient to have epistaxis in just her left nostril. Labs checked during this visit including CBC, CMP, and PT/INR were normal.  Plan -Referral to ENT

## 2017-02-11 ENCOUNTER — Ambulatory Visit: Payer: 59 | Admitting: Dietician

## 2017-02-11 ENCOUNTER — Encounter: Payer: Self-pay | Admitting: Internal Medicine

## 2017-02-11 ENCOUNTER — Ambulatory Visit (INDEPENDENT_AMBULATORY_CARE_PROVIDER_SITE_OTHER): Payer: 59 | Admitting: Internal Medicine

## 2017-02-11 DIAGNOSIS — E669 Obesity, unspecified: Secondary | ICD-10-CM

## 2017-02-11 DIAGNOSIS — Z79899 Other long term (current) drug therapy: Secondary | ICD-10-CM

## 2017-02-11 DIAGNOSIS — N644 Mastodynia: Secondary | ICD-10-CM | POA: Diagnosis not present

## 2017-02-11 DIAGNOSIS — F329 Major depressive disorder, single episode, unspecified: Secondary | ICD-10-CM | POA: Diagnosis not present

## 2017-02-11 DIAGNOSIS — Z87891 Personal history of nicotine dependence: Secondary | ICD-10-CM | POA: Diagnosis not present

## 2017-02-11 DIAGNOSIS — R05 Cough: Secondary | ICD-10-CM

## 2017-02-11 DIAGNOSIS — Z7951 Long term (current) use of inhaled steroids: Secondary | ICD-10-CM

## 2017-02-11 DIAGNOSIS — Z6841 Body Mass Index (BMI) 40.0 and over, adult: Secondary | ICD-10-CM | POA: Diagnosis not present

## 2017-02-11 DIAGNOSIS — J45909 Unspecified asthma, uncomplicated: Secondary | ICD-10-CM

## 2017-02-11 DIAGNOSIS — R040 Epistaxis: Secondary | ICD-10-CM

## 2017-02-11 DIAGNOSIS — R001 Bradycardia, unspecified: Secondary | ICD-10-CM

## 2017-02-11 DIAGNOSIS — J4520 Mild intermittent asthma, uncomplicated: Secondary | ICD-10-CM

## 2017-02-11 DIAGNOSIS — IMO0001 Reserved for inherently not codable concepts without codable children: Secondary | ICD-10-CM

## 2017-02-11 DIAGNOSIS — I10 Essential (primary) hypertension: Secondary | ICD-10-CM | POA: Diagnosis not present

## 2017-02-11 DIAGNOSIS — J309 Allergic rhinitis, unspecified: Secondary | ICD-10-CM

## 2017-02-11 MED ORDER — MELOXICAM 7.5 MG PO TABS: 7.5000 mg | ORAL_TABLET | Freq: Two times a day (BID) | ORAL | 1 refills | Status: DC | PRN

## 2017-02-11 MED ORDER — LORATADINE 10 MG PO TABS: 10.0000 mg | ORAL_TABLET | Freq: Every day | ORAL | 2 refills | Status: DC | PRN

## 2017-02-11 NOTE — Patient Instructions (Addendum)
It was nice meeting with you today Stacey Price.   I hope you found the information we discussed today about managing your weight helpful. Below is some information on label reading to get you started.   I will call you after talking with [facility] about your coverage for Medical Nutrition Therapy ([id number]) for weight management.   Please bring your food record to your follow up visit.     Reading Food Labels Foods that are packaged or in containers have a Nutrition Facts panel on the side or back. This is commonly called the food label. The food label helps you make healthy food choices by providing information about serving size and the amount of calories and various nutrients in the food. You can check the food label to find out if the food contains high or low amounts of nutrients that you want to limit in your diet. You can also use the food label to see if the food is a good source of the nutrients that you want to make sure are included in your diet. How do I read the food label?  Begin by checking the serving size and number of servings in the container. All of the nutrition information listed on the food label is based on one serving. If you eat more than one serving, you must multiply the amounts (such as calories, grams of saturated fat, or milligrams of sodium) by the number of servings.  Check the calories. Choosing foods that are low in calories can help you manage your weight.  Look at the numbers in the % Daily Value column for each listed nutrient. This gives you an idea of how much of the daily recommended amount for that nutrient is provided in one serving of the food. A daily value of 5% or less is considered low. A daily value of 20% or more is considered high.  Check the amounts for the items you should limit in your diet. These include: ? Total fat. ? Saturated fat. ? Trans fat. ? Cholesterol. ? Sodium.  Check the amounts for the items you should make sure  you get enough of. These include: ? Dietary fiber. ? Vitamins A and C. ? Calcium. ? Iron. What information is provided on the food label? Serving information  Serving size. ? The serving size is listed in cups or pieces. The nutrient amounts listed on the food label apply to this amount of the food.  Servings per container or package. ? This shows the number of servings you can expect to get from the container or package if you follow the suggested serving size. Amount per serving  Calories. ? The number of calories in one serving of the food. This information is helpful in managing weight. Low-calorie foods contain 40 calories or less. High-calorie foods contain 400 or more calories.  Calories from fat. ? The number of calories that come from fat in one serving. Percent daily value Percent daily value (shown on the label as % Daily Value) tells you what percent of the daily value for each nutrient one serving provides. The daily value is the recommended amount of the nutrient that you should get each day. For example, if 15% is listed next to dietary fiber, it means that one serving of the food will give you 15% of the recommended amount of fiber that you should get in a day. The daily values are based on a 2,000-calorie-per-day diet. You may get more or less than 2,000 calories  in your diet each day, but the % Daily Value gives you an idea of whether the food contains a high or low amount of the listed nutrient. A daily value of 5% or less is low. A daily value of 20% or more is high. Total fat Total fat shows you the total amount of fat in one serving (listed in grams). Foods with high amounts of fat usually have higher calories and may lead to weight gain. Two of the fats that make up a portion of the total fat are included on the label:  Saturated fat. ? This number is the amount of saturated fat in one serving (listed in grams). Saturated fat increases the amount of blood cholesterol  and should be limited to less than 7% of total calories each day. This means that if you eat 2,000 calories each day, you should eat less than 140 calories from saturated fat.    Cholesterol The amount of cholesterol in one serving is listed in milligrams. Cholesterol should be limited to no more than 300 mg each day. Sodium The amount of sodium in one serving is listed in milligrams. Most people should limit their sodium intake to 2,300 mg a day. Total carbohydrate This number shows the amount of total carbohydrate in one serving (listed in grams). This information can help people with diabetes manage the amount of carbohydrate they eat. Two of the carbohydrates that make up a portion of the total carbohydrate are included on the label:  Dietary fiber. ? The amount of dietary fiber in one serving is listed in grams. Most people should eat at least 25 g of dietary fiber each day.  Sugars. ? The amount of sugar in one serving is also listed in grams. This value includes both naturally occurring sugars from fruit and milk and added sugars such as honey or table sugar.  Protein The amount of protein in one serving is listed in grams. What other important labeling is on the food package? Ingredients Food labels will list each ingredient in the food. The first ingredient listed is the ingredient that the food has the most of. The ingredients are listed in the order of their amount by weight from highest to lowest. Food allergen labeling Food labels may also include a food allergen warning. Listed here are ingredients that can cause allergic reactions in some people. The potential allergens are listed behind the word "Contains" or "May contain." Examples of ingredients that may be listed are wheat, dairy, eggs, soy, and nuts. If a person knows that he or she is allergic to one of these ingredients, he or she will know to avoid that food. Where to find more information:  [place]S. Food and Drug  Administration: [employer]kin[employer].com.ee    .

## 2017-02-11 NOTE — Progress Notes (Signed)
  Medical Nutrition Therapy:  Appt start time: 0930 end time:  1030. Visit # 1  Assessment:  Primary concerns today: weight loss. Management Ms. Wassmuth would like assistance with weight loss. She reports gaining weight over the past four years after she moved to [place]. She also has depression which is well controlled today and she has just started seeing a therapist for it. She rates weight loss importance as a %/5 and her self confidence in weight loss also a 5/5 today. She has basic knowledge about nutrition  Preferred Learning Style: No preference indicated  Learning Readiness: Ready and Change in progress  ANTHROPOMETRICS: weight-287.7# height-67.25", BMI-44.73 WEIGHT HISTORY: Highest: 295# Lowest- <200 in [place], 257# in 2015 here SLEEP:7-9 hours reported  MEDICATIONS: consider weight loss medication as patient has comorbidity of hypertenion  DIETARY INTAKE: Usual eating pattern includes 4 meals and 0 snacks per day. Her meals are spaced nicely at 1am, 130 PM, 6 Pm and 1130 PMEveryday foods include hot [pockets, chicken tenders, [place] bacon, hash blown, eggs, beef hamburger helper. salad.     Beverages: milk, water, regular soda Dining Out (times/week): need to assess   PHYSICAL ACTIVITY: sits most of day at work and is thinking about joining a gym and going 30 minutes 3x/week  Estimated daily energy needs to meet her goal of weighing 175# in 18 months 1600-1700 calories  170-180 g carbohydrates 90-100 g protein  50-60 g fat  Progress Towards Goal(s):  In progress.   Nutritional Diagnosis:  NB-1.1 Food and nutrition-related knowledge deficit As related to lack of sufficient prior ,eal planning for weight management.  As evidenced by her report and lack of knowledge..    Intervention:  Nutrition education about methods and tips for weight loss, goal setting, calorie needs, keeping food records Coordination of care: recommend minimizing medications that could contribute  to weight gain or sleepiness  Teaching Method Utilized: Visual,Auditory,Hands on Handouts given during visit include: Meal planning book, NIDDK handout on calorie needs and weight loss Barriers to learning/adherence to lifestyle change: depression, finances, support Demonstrated degree of understanding via:  Teach Back   Monitoring/Evaluation:  Dietary intake, exercise, food record3, and body weight in 2 week(s) to 1 month depending on her coverage for MNT Stephani Janak, Lupita Leash, RD 02/11/2017 11:12 AM. .

## 2017-02-11 NOTE — Patient Instructions (Signed)
Stacey Price - -  For the nosebleeds - Please try Afrin over the counter, pinching the nose at the bridge of your nose and leaning forward.  If your nosebleeds do not get better, or you are not able to see ENT for them, please try stopping the meloxicam to see if this will help.   For your cough, please see ENT as above.  If cough is not improved once your nosebleeds are improved, please come back to see me sooner to discuss.   Please come back to see me in 6 months, sooner if needed.

## 2017-02-11 NOTE — Progress Notes (Signed)
   Subjective:    Patient ID: Stacey Price, female    DOB: April 02, 1976, 41 y.o.   MRN: [id number]  CC: Follow up for HTN  HPI  [name] is a 41yo woman with PMH of HTN, allergic rhinitis, asthma, depression who presents for routine follow up.  She has been seen on multiple occasions recently for breast pain, lateral which is felt to be ligamentous or musculoskeletal.  She has been on meloxicam which seems to be helping and this has improved.  She is also wearing supportive bras.  She further was recently seen for epistaxis.  She was given a referral to ENT which is in the works.   She reports that the epistaxis has been going on for four days.  She has one episode of bleeding in the morning which is stopped by leaning her head back and resting.  Labs checked showed a normal INR.  Normal H/H and no LFT issues.  She denies chest pain, dizziness, lightheadedness, picking the nose, any nasal sprays.  She does note a dry cough in the last 4 days as well, seems worse in the AM when she has the nosebleeding.  It appears she has an appointment on 8/27 with ENT  Otherwise she is doing well.  She reports compliance with her medications and no issues taking them.    Review of Systems  Constitutional: Negative for activity change and fatigue.  HENT: Positive for nosebleeds.   Respiratory: Positive for cough. Negative for shortness of breath.   Cardiovascular: Negative for chest pain and leg swelling.  Gastrointestinal: Negative for blood in stool.  Musculoskeletal: Negative for arthralgias and gait problem.  Neurological: Negative for dizziness, light-headedness and headaches.       Objective:   Physical Exam  Constitutional: She is oriented to person, place, and time. She appears well-developed and well-nourished. No distress.  HENT:  Head: Normocephalic and atraumatic.  Some blood in left nare  Eyes: Conjunctivae are normal. No scleral icterus.  Cardiovascular: Normal rate, regular rhythm and  normal heart sounds.   No murmur heard. Pulmonary/Chest: Effort normal and breath sounds normal. No respiratory distress. She has no wheezes.  Abdominal: Soft. Bowel sounds are normal.  Musculoskeletal: She exhibits no edema or tenderness.  Neurological: She is alert and oriented to person, place, and time.  Psychiatric: She has a normal mood and affect. Her behavior is normal.  Vitals reviewed.   No labs today.       Assessment & Plan:  RTC in 6 months, sooner if needed.  I have advised her if she develops any symptoms of severe anemia (discussed in detail) for her come in to see [place] sooner.

## 2017-02-12 ENCOUNTER — Telehealth: Payer: Self-pay | Admitting: *Deleted

## 2017-02-12 NOTE — Assessment & Plan Note (Signed)
This is a relatively new problem.   We discussed symptomatic management including leaning head back, pinching bridge of nose and trying Afrin OTC when the bleeding starts.    ENT appointment on 8/27.

## 2017-02-12 NOTE — Assessment & Plan Note (Signed)
She is taking loratadine without issue.  She notes that this seems to work well for her, requires a refill which was given to her today.

## 2017-02-12 NOTE — Telephone Encounter (Signed)
Pt stated someone had called her already about her ENT appt on 8/27.

## 2017-02-12 NOTE — Telephone Encounter (Signed)
Thank yoU !!! 

## 2017-02-12 NOTE — Telephone Encounter (Signed)
-----   Message from Inez Catalina, MD sent at 02/12/2017  3:18 PM EDT ----- Can you call Ms. Baudin and make sure she knows about her ENT appointment on 8/27?   Thanks!

## 2017-02-12 NOTE — Assessment & Plan Note (Signed)
Well controlled symptomatically today on Advair and PRN albuterol.  She notes no need for albuterol recently.   Plan Continue current therapy

## 2017-02-12 NOTE — Assessment & Plan Note (Signed)
Well controlled today at 129/70.  She is taking amlodipine without issue.  I noticed that she had some mild bradycardia, not on a beta blocker.  She is relatively young which could explain this.  On review, she has been mostly normal in the past.   Plan Continue amlodipine Monitor pulse at next visit.

## 2017-02-12 NOTE — Assessment & Plan Note (Signed)
Following with MNT; she saw Norm Parcel today.

## 2017-02-12 NOTE — Assessment & Plan Note (Signed)
This is improved on meloxicam.  I am wondering if her nosebleeds could be explained by meloxicam, I do not see this as a specific adverse effect, however, bleeding risk can go up with NSAIDs.  I advised her, that if her nosebleeds worsen or do not get better, she should try abstaining from meloxicam and see if that improves the issue.   Plan Continue meloxicam with caveat noted above.

## 2017-02-13 NOTE — Progress Notes (Signed)
Internal Medicine Clinic Attending  I saw and evaluated the patient.  I personally confirmed the key portions of the history and exam documented by Dr. Rathore and I reviewed pertinent patient test results.  The assessment, diagnosis, and plan were formulated together and I agree with the documentation in the resident's note.  

## 2017-02-16 DIAGNOSIS — H60333 Swimmer's ear, bilateral: Secondary | ICD-10-CM | POA: Diagnosis not present

## 2017-02-16 DIAGNOSIS — R040 Epistaxis: Secondary | ICD-10-CM | POA: Diagnosis not present

## 2017-02-26 ENCOUNTER — Encounter: Payer: Self-pay | Admitting: Internal Medicine

## 2017-02-26 ENCOUNTER — Ambulatory Visit (INDEPENDENT_AMBULATORY_CARE_PROVIDER_SITE_OTHER): Payer: 59 | Admitting: Internal Medicine

## 2017-02-26 DIAGNOSIS — J00 Acute nasopharyngitis [common cold]: Secondary | ICD-10-CM

## 2017-02-26 DIAGNOSIS — I10 Essential (primary) hypertension: Secondary | ICD-10-CM | POA: Diagnosis not present

## 2017-02-26 NOTE — Patient Instructions (Addendum)
Thank you for your visit today Please take tylenol, and over the counter dextromethorphan (robitussin) for your cold symptoms. This will take few days to resolve. You can also take tylenol as needed. Please avoid exposure to passive smoke.  Please follow up if your symptoms do not improve in 10 days. Please also let us know if your asthma flares up, or if you need to use your albuterol significantly more often.       Upper Respiratory Infection, Adult Most upper respiratory infections (URIs) are a viral infection of the air passages leading to the lungs. A URI affects the nose, throat, and upper air passages. The most common type of URI is nasopharyngitis and is typically referred to as "the common cold." URIs run their course and usually go away on their own. Most of the time, a URI does not require medical attention, but sometimes a bacterial infection in the upper airways can follow a viral infection. This is called a secondary infection. Sinus and middle ear infections are common types of secondary upper respiratory infections. Bacterial pneumonia can also complicate a URI. A URI can worsen asthma and chronic obstructive pulmonary disease (COPD). Sometimes, these complications can require emergency medical care and may be life threatening. What are the causes? Almost all URIs are caused by viruses. A virus is a type of germ and can spread from one person to another. What increases the risk? You may be at risk for a URI if:  You smoke.  You have chronic heart or lung disease.  You have a weakened defense (immune) system.  You are very young or very old.  You have nasal allergies or asthma.  You work in crowded or poorly ventilated areas.  You work in health care facilities or schools.  What are the signs or symptoms? Symptoms typically develop 2-3 days after you come in contact with a cold virus. Most viral URIs last 7-10 days. However, viral URIs from the influenza virus (flu  virus) can last 14-18 days and are typically more severe. Symptoms may include:  Runny or stuffy (congested) nose.  Sneezing.  Cough.  Sore throat.  Headache.  Fatigue.  Fever.  Loss of appetite.  Pain in your forehead, behind your eyes, and over your cheekbones (sinus pain).  Muscle aches.  How is this diagnosed? Your health care provider may diagnose a URI by:  Physical exam.  Tests to check that your symptoms are not due to another condition such as: ? Strep throat. ? Sinusitis. ? Pneumonia. ? Asthma.  How is this treated? A URI goes away on its own with time. It cannot be cured with medicines, but medicines may be prescribed or recommended to relieve symptoms. Medicines may help:  Reduce your fever.  Reduce your cough.  Relieve nasal congestion.  Follow these instructions at home:  Take medicines only as directed by your health care provider.  Gargle warm saltwater or take cough drops to comfort your throat as directed by your health care provider.  Use a warm mist humidifier or inhale steam from a shower to increase air moisture. This may make it easier to breathe.  Drink enough fluid to keep your urine clear or pale yellow.  Eat soups and other clear broths and maintain good nutrition.  Rest as needed.  Return to work when your temperature has returned to normal or as your health care provider advises. You may need to stay home longer to avoid infecting others. You can also use a face  mask and careful hand washing to prevent spread of the virus.  Increase the usage of your inhaler if you have asthma.  Do not use any tobacco products, including cigarettes, chewing tobacco, or electronic cigarettes. If you need help quitting, ask your health care provider. How is this prevented? The best way to protect yourself from getting a cold is to practice good hygiene.  Avoid oral or hand contact with people with cold symptoms.  Wash your hands often if  contact occurs.  There is no clear evidence that vitamin C, vitamin E, echinacea, or exercise reduces the chance of developing a cold. However, it is always recommended to get plenty of rest, exercise, and practice good nutrition. Contact a health care provider if:  You are getting worse rather than better.  Your symptoms are not controlled by medicine.  You have chills.  You have worsening shortness of breath.  You have brown or red mucus.  You have yellow or brown nasal discharge.  You have pain in your face, especially when you bend forward.  You have a fever.  You have swollen neck glands.  You have pain while swallowing.  You have white areas in the back of your throat. Get help right away if:  You have severe or persistent: ? Headache. ? Ear pain. ? Sinus pain. ? Chest pain.  You have chronic lung disease and any of the following: ? Wheezing. ? Prolonged cough. ? Coughing up blood. ? A change in your usual mucus.  You have a stiff neck.  You have changes in your: ? Vision. ? Hearing. ? Thinking. ? Mood. This information is not intended to replace advice given to you by your health care provider. Make sure you discuss any questions you have with your health care provider. Document Released: 12/03/2000 Document Revised: 02/10/2016 Document Reviewed: 09/14/2013 Elsevier Interactive Patient Education  2017 [employer]

## 2017-02-26 NOTE — Assessment & Plan Note (Addendum)
Patient is here for 1 week history of sore throat, and dry cough with intermittent yellow phlegm production. She says that she is exposed to sick contacts and has a 43 year old with similar symptoms. She has been drinking hot teas, and taking cepacol.  On exam, oropharynx clear without exudates. No lymphadenopathy. Some maxillary sinus tenderness and bitemporal pressure. No wheezing heard. She also has some features of acute rhinosinusitis with bitemporal pressure and maxillary sinus pressure.  She does not smoke but exposed to passive smoke  Pt does have asthma and is on advair maintenance inhaler and albuterol. Since her symptoms started, she has been using albuterol 2-3 times a week. I do not think that based on her exam she is currently experiencing exacerbation of asthma. Do not think she needs antibiotics currently.   A: common cold in the setting of asthma and allergic rhinitis  P -advised symptomatic treatment- tylenol PRN, dextromethorphan, cepacol, hand washing.  -continue claritin for her allergic rhinitis -return precautions given regarding her asthma- asked her to continue advair and take albuterol PRN. With the resolution of her current symptoms, her need to use albuterol inhaler should subside.  -advised to avoid exposure to passive smoking

## 2017-02-26 NOTE — Assessment & Plan Note (Signed)
Patient's heart rate today was 65. She denies any dizziness. She is compliant with her amlodipine.

## 2017-02-26 NOTE — Progress Notes (Signed)
    CC: common cold, asthma HPI: Ms.Everlynn Azucena CecilBurton is a 41 y.o. woman with PMH noted below here for common cold in the setting of existing asthma  Please see Problem List/A&P for the status of the patient's chronic medical problems   Past Medical History:  Diagnosis Date  . Asthma   . Hypertension   . Obesity     Review of Systems: Denies fevers, chills. Has some fatigue Has some headache and stuffiness. Has sore throat and dry cough, with intermittent yellow phlegm. No hearing problems or ear pain Denies n/v/abd pain, diarrhea.  Denies myalgias Has exposure to sick contacts   Physical Exam: Vitals:   02/26/17 0935  BP: 139/82  Pulse: 65  Temp: 98.2 F (36.8 C)  TempSrc: Oral  SpO2: 100%  Weight: 290 lb 9.6 oz (131.8 kg)  Height: 5\' 7"  (1.702 m)    General: A&O, in NAD HEENT: MMM< oropharynx clear without exudates. No lymphadenopathy. Some maxillary sinus tenderness and bitemporal pressure.  CV: RRR, normal s1, s2, no m/r/g, no carotid bruits appreciated Resp: equal and symmetric breath sounds, no wheezing  Abdomen: soft, nontender, nondistended, +BS   Assessment & Plan:   See encounters tab for problem based medical decision making. Patient discussed with Dr. Criselda PeachesMullen

## 2017-03-02 NOTE — Progress Notes (Signed)
Internal Medicine Clinic Attending  Case discussed with Dr. Saraiya at the time of the visit.  We reviewed the resident's history and exam and pertinent patient test results.  I agree with the assessment, diagnosis, and plan of care documented in the resident's note.  

## 2017-03-04 ENCOUNTER — Encounter: Payer: 59 | Admitting: Dietician

## 2017-03-12 IMAGING — CR DG CHEST 2V
2 series · 2 of 2 positions shown · non-contrast
Comparison: 05/30/2015

CLINICAL DATA: Chest pain for 3 days

EXAM:
CHEST  2 VIEW

[w chest pa]
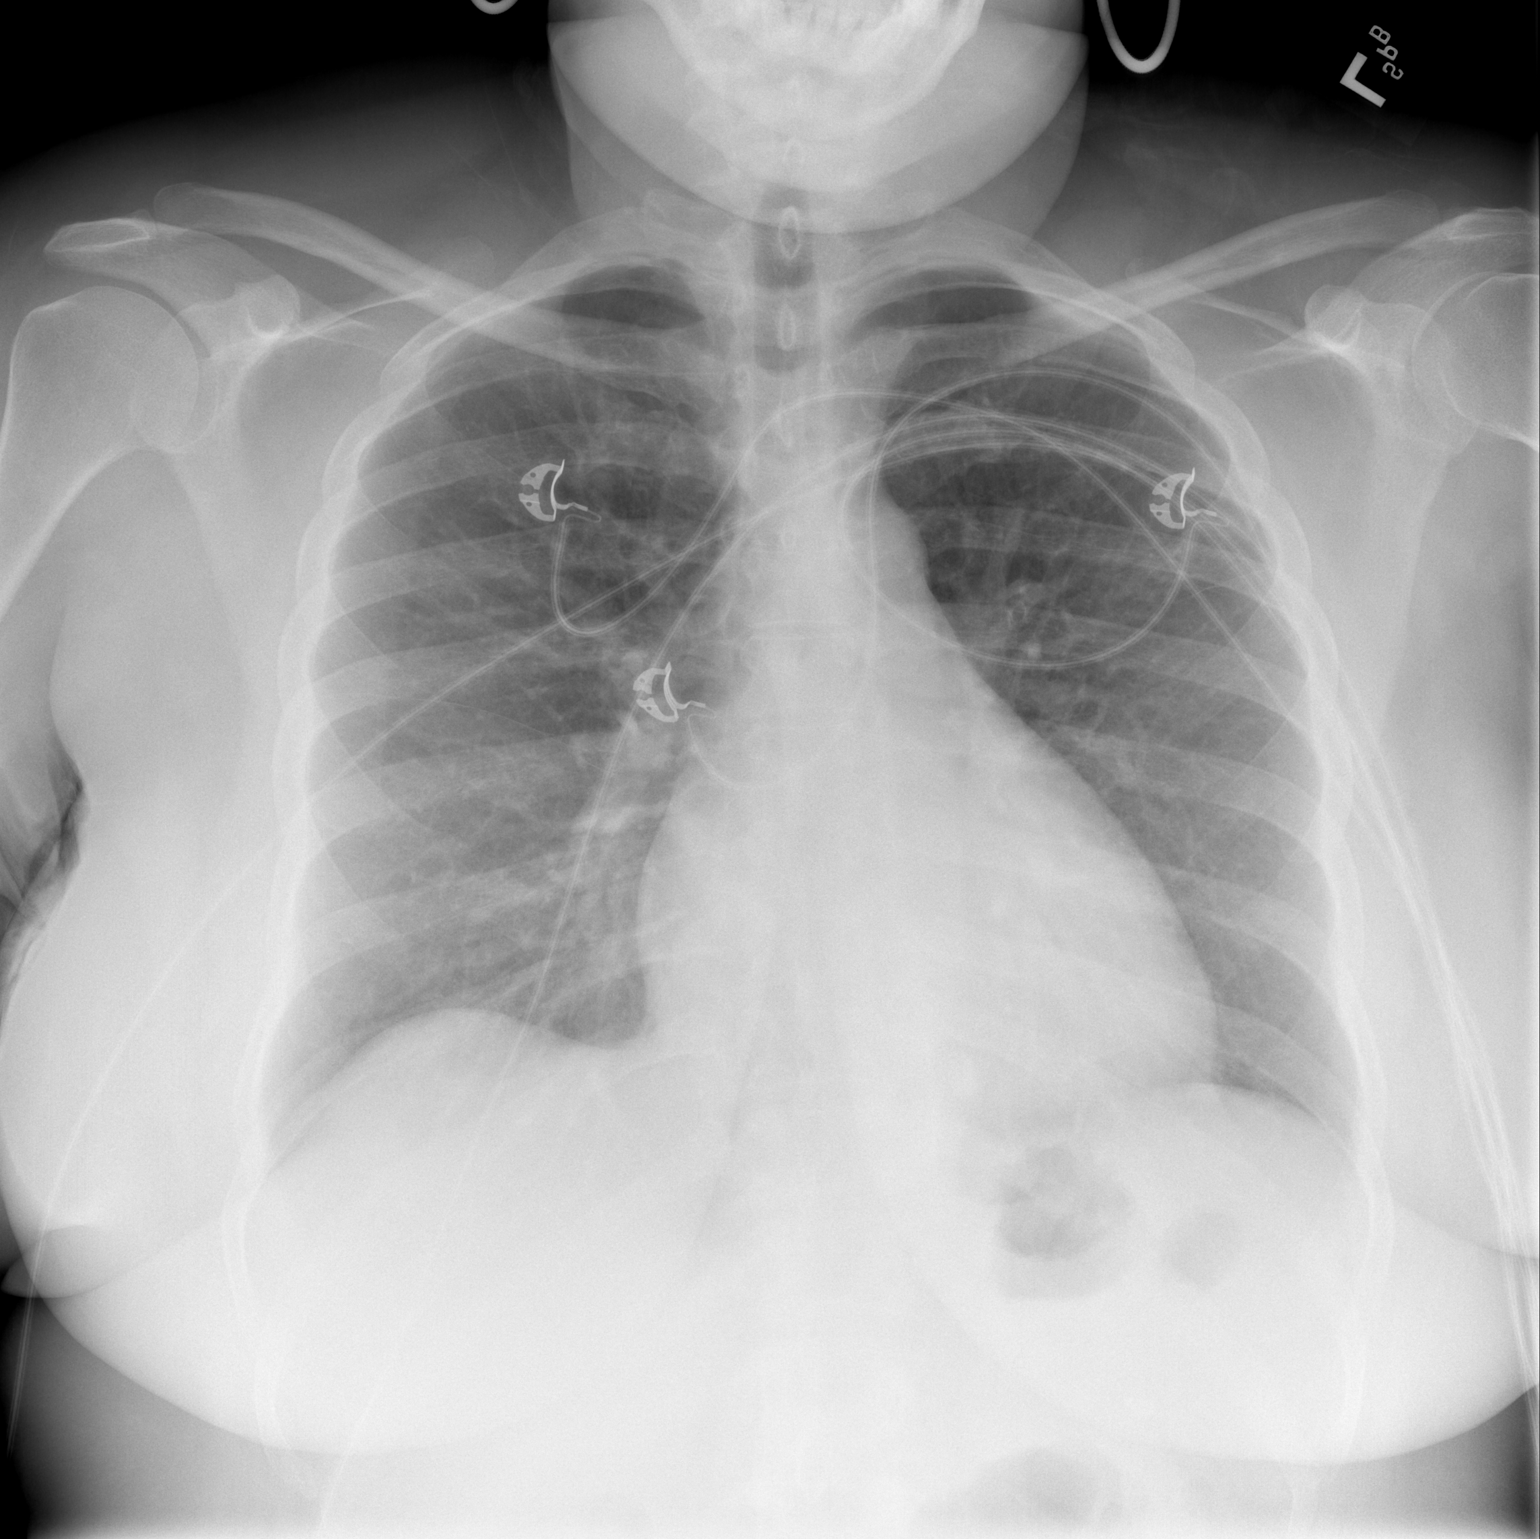

[w chest lat]
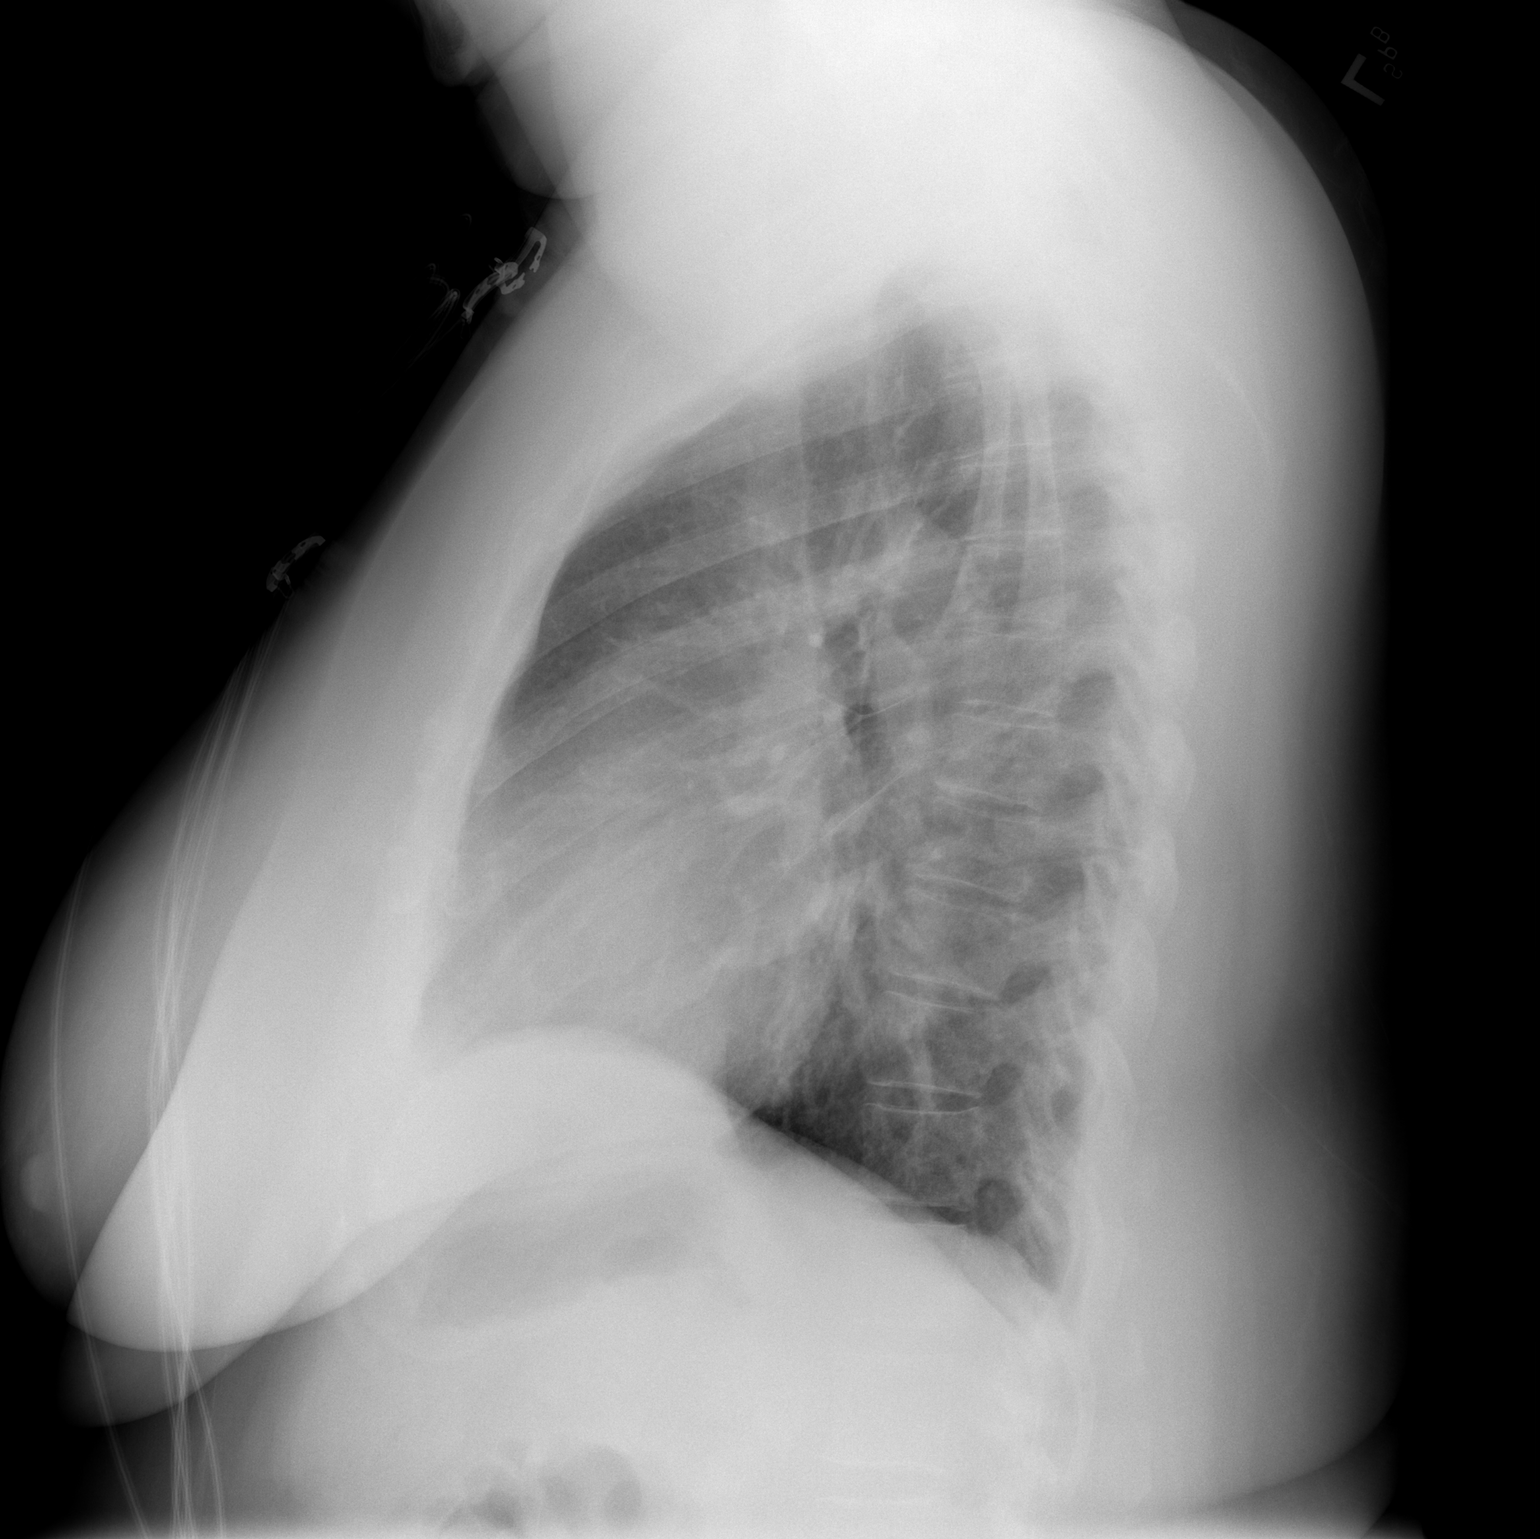

[2 of 2 positions shown; findings below may reference images not displayed]

FINDINGS: The heart size and mediastinal contours are within normal limits.
Both lungs are clear. The visualized skeletal structures are
unremarkable.
IMPRESSION: No active cardiopulmonary disease.

## 2017-03-17 ENCOUNTER — Telehealth: Payer: Self-pay | Admitting: Internal Medicine

## 2017-03-17 NOTE — Telephone Encounter (Signed)
Patient is requesting a callback, regarding Breast Pain acc. letter for work. Pls call.

## 2017-03-19 NOTE — Telephone Encounter (Signed)
Pt stated she talked to Chilon this morning - no further questions.

## 2017-03-26 ENCOUNTER — Telehealth: Payer: Self-pay | Admitting: Internal Medicine

## 2017-04-02 ENCOUNTER — Encounter: Payer: Self-pay | Admitting: Internal Medicine

## 2017-04-02 ENCOUNTER — Ambulatory Visit (INDEPENDENT_AMBULATORY_CARE_PROVIDER_SITE_OTHER): Payer: 59 | Admitting: Internal Medicine

## 2017-04-02 ENCOUNTER — Telehealth: Payer: Self-pay | Admitting: *Deleted

## 2017-04-02 ENCOUNTER — Telehealth: Payer: Self-pay | Admitting: Internal Medicine

## 2017-04-02 VITALS — BP 151/91 | HR 56 | Temp 98.6°F | Ht 66.0 in | Wt 287.8 lb

## 2017-04-02 DIAGNOSIS — Z6841 Body Mass Index (BMI) 40.0 and over, adult: Secondary | ICD-10-CM | POA: Diagnosis not present

## 2017-04-02 DIAGNOSIS — A084 Viral intestinal infection, unspecified: Secondary | ICD-10-CM | POA: Diagnosis not present

## 2017-04-02 DIAGNOSIS — E669 Obesity, unspecified: Secondary | ICD-10-CM | POA: Diagnosis not present

## 2017-04-02 DIAGNOSIS — I10 Essential (primary) hypertension: Secondary | ICD-10-CM | POA: Diagnosis not present

## 2017-04-02 DIAGNOSIS — G43919 Migraine, unspecified, intractable, without status migrainosus: Secondary | ICD-10-CM

## 2017-04-02 DIAGNOSIS — G43909 Migraine, unspecified, not intractable, without status migrainosus: Secondary | ICD-10-CM | POA: Insufficient documentation

## 2017-04-02 LAB — POCT URINALYSIS DIPSTICK
GLUCOSE UA: NEGATIVE
Leukocytes, UA: NEGATIVE
NITRITE UA: NEGATIVE
Spec Grav, UA: 1.025 (ref 1.010–1.025)
UROBILINOGEN UA: 4 U/dL — AB
pH, UA: 6 (ref 5.0–8.0)

## 2017-04-02 MED ORDER — TOPIRAMATE 25 MG PO TABS: 25.0000 mg | ORAL_TABLET | Freq: Every day | ORAL | 0 refills | Status: DC

## 2017-04-02 NOTE — Patient Instructions (Signed)
It was a pleasure to see you today Stacey Price. During this visit we made the following changes:   -Your abdominal discomfort, nausea/vomiting is likely due to a viral infection. Please drink plenty of fluids including gatorade. Please slowly resume your diet. Avoid spicy and fried foods.  -Your blood pressure was elevated at this visit. It is possible that it is elevated as you are ill. Please follow up in 2 weeks to follow up on your blood pressure -You are likely experiencing migraine headaches. Please start taking Topimax daily -Please read the information below -Follow up in 2 weeks  Thank you,  Lorenso Courier, MD Internal Medicine PGY1    Viral Gastroenteritis, Adult Viral gastroenteritis is also known as the stomach flu. This condition is caused by certain germs (viruses). These germs can be passed from person to person very easily (are very contagious). This condition can cause sudden watery poop (diarrhea), fever, and throwing up (vomiting). Having watery poop and throwing up can make you feel weak and cause you to get dehydrated. Dehydration can make you tired and thirsty, make you have a dry mouth, and make it so you pee (urinate) less often. Older adults and people with other diseases or a weak defense system (immune system) are at higher risk for dehydration. It is important to replace the fluids that you lose from having watery poop and throwing up. Follow these instructions at home: Follow instructions from your doctor about how to care for yourself at home. Eating and drinking  Follow these instructions as told by your doctor:  Take an oral rehydration solution (ORS). This is a drink that is sold at pharmacies and stores.  Drink clear fluids in small amounts as you are able, such as: ? Water. ? Ice chips. ? Diluted fruit juice. ? Low-calorie sports drinks.  Eat bland, easy-to-digest foods in small amounts as you are able, such  as: ? Bananas. ? Applesauce. ? Rice. ? Low-fat (lean) meats. ? Toast. ? Crackers.  Avoid fluids that have a lot of sugar or caffeine in them.  Avoid alcohol.  Avoid spicy or fatty foods.  General instructions  Drink enough fluid to keep your pee (urine) clear or pale yellow.  Wash your hands often. If you cannot use soap and water, use hand sanitizer.  Make sure that all people in your home wash their hands well and often.  Rest at home while you get better.  Take over-the-counter and prescription medicines only as told by your doctor.  Watch your condition for any changes.  Take a warm bath to help with any burning or pain from having watery poop.  Keep all follow-up visits as told by your doctor. This is important. Contact a doctor if:  You cannot keep fluids down.  Your symptoms get worse.  You have new symptoms.  You feel light-headed or dizzy.  You have muscle cramps. Get help right away if:  You have chest pain.  You feel very weak or you pass out (faint).  You see blood in your throw-up.  Your throw-up looks like coffee grounds.  You have bloody or black poop (stools) or poop that look like tar.  You have a very bad headache, a stiff neck, or both.  You have a rash.  You have very bad pain, cramping, or bloating in your belly (abdomen).  You have trouble breathing.  You are breathing very quickly.  Your heart is beating very quickly.  Your skin feels cold and clammy.  You feel confused.  You have pain when you pee.  You have signs of dehydration, such as: ? Dark pee, hardly any pee, or no pee. ? Cracked lips. ? Dry mouth. ? Sunken eyes. ? Sleepiness. ? Weakness. This information is not intended to replace advice given to you by your health care provider. Make sure you discuss any questions you have with your health care provider. Document Released: 11/26/2007 Document Revised: 12/28/2015 Document Reviewed: 02/13/2015 Elsevier  Interactive Patient Education  2017 [employer] Inc[employer]    Migraine Headache A migraine headache is a very strong throbbing pain on one side or both sides of your head. Migraines can also cause other symptoms. Talk with your doctor about what things may bring on (trigger) your migraine headaches. Follow these instructions at home: Medicines  Take over-the-counter and prescription medicines only as told by your doctor.  Do not drive or use heavy machinery while taking prescription pain medicine.  To prevent or treat constipation while you are taking prescription pain medicine, your doctor may recommend that you: ? Drink enough fluid to keep your pee (urine) clear or pale yellow. ? Take over-the-counter or prescription medicines. ? Eat foods that are high in fiber. These include fresh fruits and vegetables, whole grains, and beans. ? Limit foods that are high in fat and processed sugars. These include fried and sweet foods. Lifestyle  Avoid alcohol.  Do not use any products that contain nicotine or tobacco, such as cigarettes and e-cigarettes. If you need help quitting, ask your doctor.  Get at least 8 hours of sleep every night.  Limit your stress. General instructions   Keep a journal to find out what may bring on your migraines. For example, write down: ? What you eat and drink. ? How much sleep you get. ? Any change in what you eat or drink. ? Any change in your medicines.  If you have a migraine: ? Avoid things that make your symptoms worse, such as bright lights. ? It may help to lie down in a dark, quiet room. ? Do not drive or use heavy machinery. ? Ask your doctor what activities are safe for you.  Keep all follow-up visits as told by your doctor. This is important. Contact a doctor if:  You get a migraine that is different or worse than your usual migraines. Get help right away if:  Your migraine gets very bad.  You have a fever.  You have a stiff neck.  You  have trouble seeing.  Your muscles feel weak or like you cannot control them.  You start to lose your balance a lot.  You start to have trouble walking.  You pass out (faint). This information is not intended to replace advice given to you by your health care provider. Make sure you discuss any questions you have with your health care provider. Document Released: 03/18/2008 Document Revised: 12/28/2015 Document Reviewed: 11/26/2015 Elsevier Interactive Patient Education  2017 [employer].

## 2017-04-02 NOTE — Assessment & Plan Note (Signed)
The patient states that she has been having nausea and vomiting for the past 3 days. The patient states that she only vomitted once 3 days ago. The vomitus was yellow green in color. The patient has also had generalized abdominal discomfort. She has noted 3-4 episodes of watery non-bloody diarrhea for 3 days as well. The patient has not taken any medication for these symptoms. The patient has been around some children who have been sick. The patient does not report recent travel. The patient states that she has been keeping herself well hydrated with water and ginger ale. The patient has lost 3 lbs since her last visit in September 2018, she states that she has been trying to lose weight. The patient states that today 04/02/17 she has semi-formed stool.   -encouraged patient to drink plenty of fluids and resume diet slowly. Told her to restrict herself from spicy foods -urine dipstick- negative of leukocytes and nitrates. Unlikely to be a uti

## 2017-04-02 NOTE — Assessment & Plan Note (Signed)
The patient's blood pressure today was elevated at 151/84, 64. Repeat manual measurement showed 151/94,66. The patient is currently being prescribed amlodipine  qd. She is compliant with her medication. The patient has lost 3 lbs since her last visit in September 2018, she states that she has been trying to lose weight.   The patient states that she has been having palpitations for several years now. The patient's last ekg was in February 2018 which showed sinus rhythm. On exam, the patient had normal rhythm. The patient states that she has anxiety which she feels cause her palpitations. We did not get ekg as the patient is acutely ill.   -Follow up in 2 weeks for bp follow up in non-acute setting -Can consider outpatient cardiac monitor for palpitations

## 2017-04-02 NOTE — Telephone Encounter (Signed)
Patient c/o diarrhea x 2-3 days now, vomitting 3 days ago but since has stopped. Said everytime she eats she has to use bathroom. Denies fever or pain. Appt in [facility] today @ 1:15 pm.

## 2017-04-02 NOTE — Progress Notes (Signed)
   CC: Nausea, Vomiting, Diarrhea  HPI:  Stacey Price is a 41 y.o. with pmh of htn, asthma, and obesity who presented for diarrhea, nausea/vomiting for three days.   Past Medical History:  Diagnosis Date  . Asthma   . Hypertension   . Obesity    Review of Systems:   Review of Systems  Constitutional: Positive for chills. Negative for fever.  Eyes: Positive for blurred vision.  Cardiovascular: Positive for palpitations.  Gastrointestinal: Positive for diarrhea, nausea and vomiting.  Genitourinary: Positive for frequency (for years). Negative for dysuria and urgency.  Musculoskeletal: Positive for joint pain (knee).  Neurological: Positive for dizziness, tingling, sensory change (numbness in the fingertips and toes) and headaches.     Physical Exam:  There were no vitals filed for this visit.  Physical Exam  Constitutional: She appears well-developed and well-nourished. No distress.  HENT:  Head: Normocephalic and atraumatic.  Eyes: Conjunctivae are normal.  Cardiovascular: Normal rate, regular rhythm and normal heart sounds.   Pulmonary/Chest: Effort normal and breath sounds normal. No respiratory distress. She has no wheezes.  Abdominal: Soft. Bowel sounds are normal. She exhibits no distension. There is no tenderness (suprapubic and right lower quadrant ).  Skin: No erythema.  Psychiatric: She has a normal mood and affect. Her behavior is normal. Judgment and thought content normal.    Assessment & Plan:   See Encounters Tab for problem based charting.  Patient seen with Dr. Heide Spark

## 2017-04-02 NOTE — Assessment & Plan Note (Signed)
The patient notes frequent headaches for several years no. She states that her headaches are located in her posterior head, usually occur when she first wakes up, worsens with bright light, no change with bright light. The patient states that she was told that she has migraine headaches by her pcp in [place]. She has accompanied nausea and vomiting with the headaches, blurry vision. The patient states that she uses Excedrin and ibuprofen for the headaches which help alleviate the headaches. The patient has a low pulse and therefore will not give propranolol for migraine prevention at this time.   -Start topimax  qhs -follow up in 2 weeks

## 2017-04-03 ENCOUNTER — Ambulatory Visit: Payer: Self-pay

## 2017-04-03 NOTE — Progress Notes (Signed)
Internal Medicine Clinic Attending  I saw and evaluated the patient.  I personally confirmed the key portions of the history and exam documented by Dr. Chundi and I reviewed pertinent patient test results.  The assessment, diagnosis, and plan were formulated together and I agree with the documentation in the resident's note. 

## 2017-05-06 ENCOUNTER — Encounter: Payer: Self-pay | Admitting: Internal Medicine

## 2017-05-06 ENCOUNTER — Ambulatory Visit (INDEPENDENT_AMBULATORY_CARE_PROVIDER_SITE_OTHER): Payer: 59 | Admitting: Internal Medicine

## 2017-05-06 ENCOUNTER — Encounter (INDEPENDENT_AMBULATORY_CARE_PROVIDER_SITE_OTHER): Payer: Self-pay

## 2017-05-06 ENCOUNTER — Other Ambulatory Visit: Payer: Self-pay

## 2017-05-06 ENCOUNTER — Ambulatory Visit (HOSPITAL_COMMUNITY)
Admission: RE | Admit: 2017-05-06 | Discharge: 2017-05-06 | Disposition: A | Discharge status: Home / Self Care | Payer: 59 | Source: Ambulatory Visit | Attending: Internal Medicine | Admitting: Internal Medicine

## 2017-05-06 VITALS — BP 142/84 | HR 72 | Temp 98.5°F | Ht 67.0 in | Wt 285.1 lb

## 2017-05-06 DIAGNOSIS — J00 Acute nasopharyngitis [common cold]: Secondary | ICD-10-CM | POA: Insufficient documentation

## 2017-05-06 DIAGNOSIS — R358 Other polyuria: Secondary | ICD-10-CM

## 2017-05-06 DIAGNOSIS — R05 Cough: Secondary | ICD-10-CM | POA: Diagnosis not present

## 2017-05-06 DIAGNOSIS — G43919 Migraine, unspecified, intractable, without status migrainosus: Secondary | ICD-10-CM | POA: Diagnosis not present

## 2017-05-06 DIAGNOSIS — I10 Essential (primary) hypertension: Secondary | ICD-10-CM

## 2017-05-06 DIAGNOSIS — A084 Viral intestinal infection, unspecified: Secondary | ICD-10-CM

## 2017-05-06 DIAGNOSIS — G44209 Tension-type headache, unspecified, not intractable: Secondary | ICD-10-CM

## 2017-05-06 DIAGNOSIS — R3589 Other polyuria: Secondary | ICD-10-CM

## 2017-05-06 MED ORDER — TOPIRAMATE 25 MG PO TABS: ORAL_TABLET | ORAL | 0 refills | Status: DC

## 2017-05-06 NOTE — Progress Notes (Addendum)
   Subjective:    Patient ID: Stacey Price, female    DOB: [birth year], 41 y.o.   MRN: [id number]  CC: 4 week follow up for HTN and palpitations and migraine follow up  HPI   Stacey Price is a 41yo woman with PMH of allergies, breast pain, HTN, migraine who presents for follow up.  She was last seen 4 weeks ago for nausea vomiting, HTN and migraine.  She was started on Topamax for migraine prophylaxis.    Today, Stacey Price arrived 15 minutes late and so this was an abbreviated appointment.    Today, Stacey Price reports 3-4 weeks of a flu like illness.  It started with cough which was productive of green mucus (no blood), fever, runny nose, earache, night sweats over the last week, diarrhea.  She initially had bad diarrhea, but now it is only intermittent.  She has had some weakness, but no myalgia.  She has been keeping down fluids, but is not eating much.  She notes also polyuria, polydipsia and polyphagia, she is concerned that she has diabetes.  She presented about 4 weeks ago at the initial part of this and was told she had the flu.  She had trace ketones in her urine which could have been from dehydration.    She further notes daily headaches.  She has a history of migraines and has tried triptans without success.  She was started on Topamax at last visit, but only given 7 tablets.  She now has a headache daily and has some worsening in the morning, with bending over and with laughing and coughing.  She takes excedrin daily.  She did not notice an improvement with topamax.    Review of Systems  Constitutional: Positive for chills, diaphoresis and fever. Negative for unexpected weight change.  HENT: Positive for ear pain and rhinorrhea.   Respiratory: Positive for cough.   Cardiovascular: Negative for chest pain.  Gastrointestinal: Positive for diarrhea.  Endocrine: Positive for polydipsia, polyphagia and polyuria.  Musculoskeletal: Negative for myalgias and neck pain.  Neurological:  Positive for headaches.       Objective:   Physical Exam  Constitutional: She is oriented to person, place, and time. She appears well-developed and well-nourished.  Appears fatigued  HENT:  Head: Normocephalic and atraumatic.  Right Ear: External ear normal.  Left Ear: External ear normal.  Mouth/Throat: Oropharynx is clear and moist.  Eyes: Conjunctivae are normal. No scleral icterus.  Cardiovascular: Normal rate, regular rhythm and normal heart sounds.  Pulmonary/Chest: Breath sounds normal. No respiratory distress. She has no wheezes.  Musculoskeletal: She exhibits no edema or tenderness.  Neurological: She is alert and oriented to person, place, and time.  Psychiatric: She has a normal mood and affect. Her behavior is normal.    CMET, CBC, UA today  If CMET glucose elevated, will have her come back for A1C  CXR today      Assessment & Plan:  RTC in 1 week.

## 2017-05-06 NOTE — Assessment & Plan Note (Signed)
She has a family history of DM and is concerned she may have it.  Random BG to check.  UA to evaluate for worsening ketones.  Given her illness, fever and decreased PO intake, will also check BMET for renal function.

## 2017-05-06 NOTE — Assessment & Plan Note (Signed)
Given her new symptoms, I think she likely has underlying migraines, but that given her prolonged illness she has had a worsening tension type headache which has progressed to a medication overuse headache.    Plan STOP excedrin Trial of tylenol in place of excedrin Restart Topamax at 25mg  at night for 7 days and then increase to 50mg  at night Follow up in 1 week

## 2017-05-06 NOTE — Assessment & Plan Note (Signed)
BP is still elevated today, but she is still unwell. .  Plan See back in 1 week, recheck at that time.

## 2017-05-06 NOTE — Assessment & Plan Note (Addendum)
Her symptoms have now lasted 4 weeks.  She has more upper respiratory symptoms now compared with diarrhea.  She does not seem to have the flu based on lack of myalgias.  Her VS are stable.  I will check a CXR for pneumonia given the protracted illness, check CBC and BMET.    UPDATE: CXR WNL, no pneumonia.  No abx needed.  Called and discussed results with patient.

## 2017-05-06 NOTE — Patient Instructions (Addendum)
Stacey Price - -  Please get a chest xray today and some blood work to see if you are having pneumonia.  I will call you with results.   For your headaches.  Please STOP the excedrin and switch to tylenol if needed.  I think you have what's called a medication overuse headache which may have worsened due to your viral illness.   I would like you to restart the topamax and take it at 25mg  at night for 1 week and then increase to 50mg  at night.   Please come back to see me in 1 week.  (add on at 1115)  Topiramate tablets What is this medicine? TOPIRAMATE (toe PYRE a mate) is used to treat seizures in adults or children with epilepsy. It is also used for the prevention of migraine headaches. This medicine may be used for other purposes; ask your health care provider or pharmacist if you have questions. COMMON BRAND NAME(S): Topamax, Topiragen What should I tell my health care provider before I take this medicine? They need to know if you have any of these conditions: -bleeding disorders -cirrhosis of the liver or liver disease -diarrhea -glaucoma -kidney stones or kidney disease -low blood counts, like low white cell, platelet, or red cell counts -lung disease like asthma, obstructive pulmonary disease, emphysema -metabolic acidosis -on a ketogenic diet -schedule for surgery or a procedure -suicidal thoughts, plans, or attempt; a previous suicide attempt by you or a family member -an unusual or allergic reaction to topiramate, other medicines, foods, dyes, or preservatives -pregnant or trying to get pregnant -breast-feeding How should I use this medicine? Take this medicine by mouth with a glass of water. Follow the directions on the prescription label. Do not crush or chew. You may take this medicine with meals. Take your medicine at regular intervals. Do not take it more often than directed. Talk to your pediatrician regarding the use of this medicine in children. Special care may be  needed. While this drug may be prescribed for children as young as 452 years of age for selected conditions, precautions do apply. Overdosage: If you think you have taken too much of this medicine contact a poison control center or emergency room at once. NOTE: This medicine is only for you. Do not share this medicine with others. What if I miss a dose? If you miss a dose, take it as soon as you can. If your next dose is to be taken in less than 6 hours, then do not take the missed dose. Take the next dose at your regular time. Do not take double or extra doses. What may interact with this medicine? Do not take this medicine with any of the following medications: -probenecid This medicine may also interact with the following medications: -acetazolamide -alcohol -amitriptyline -aspirin and aspirin-like medicines -birth control pills -certain medicines for depression -certain medicines for seizures -certain medicines that treat or prevent blood clots like warfarin, enoxaparin, dalteparin, apixaban, dabigatran, and rivaroxaban -digoxin -hydrochlorothiazide -lithium -medicines for pain, sleep, or muscle relaxation -metformin -methazolamide -NSAIDS, medicines for pain and inflammation, like ibuprofen or naproxen -pioglitazone -risperidone This list may not describe all possible interactions. Give your health care provider a list of all the medicines, herbs, non-prescription drugs, or dietary supplements you use. Also tell them if you smoke, drink alcohol, or use illegal drugs. Some items may interact with your medicine. What should I watch for while using this medicine? Visit your doctor or health care professional for regular checks on  your progress. Do not stop taking this medicine suddenly. This increases the risk of seizures if you are using this medicine to control epilepsy. Wear a medical identification bracelet or chain to say you have epilepsy or seizures, and carry a card that lists all  your medicines. This medicine can decrease sweating and increase your body temperature. Watch for signs of deceased sweating or fever, especially in children. Avoid extreme heat, hot baths, and saunas. Be careful about exercising, especially in hot weather. Contact your health care provider right away if you notice a fever or decrease in sweating. You should drink plenty of fluids while taking this medicine. If you have had kidney stones in the past, this will help to reduce your chances of forming kidney stones. If you have stomach pain, with nausea or vomiting and yellowing of your eyes or skin, call your doctor immediately. You may get drowsy, dizzy, or have blurred vision. Do not drive, use machinery, or do anything that needs mental alertness until you know how this medicine affects you. To reduce dizziness, do not sit or stand up quickly, especially if you are an older patient. Alcohol can increase drowsiness and dizziness. Avoid alcoholic drinks. If you notice blurred vision, eye pain, or other eye problems, seek medical attention at once for an eye exam. The use of this medicine may increase the chance of suicidal thoughts or actions. Pay special attention to how you are responding while on this medicine. Any worsening of mood, or thoughts of suicide or dying should be reported to your health care professional right away. This medicine may increase the chance of developing metabolic acidosis. If left untreated, this can cause kidney stones, bone disease, or slowed growth in children. Symptoms include breathing fast, fatigue, loss of appetite, irregular heartbeat, or loss of consciousness. Call your doctor immediately if you experience any of these side effects. Also, tell your doctor about any surgery you plan on having while taking this medicine since this may increase your risk for metabolic acidosis. Birth control pills may not work properly while you are taking this medicine. Talk to your doctor  about using an extra method of birth control. Women who become pregnant while using this medicine may enroll in the [place] Antiepileptic Drug Pregnancy Registry by calling [phone]. This registry collects information about the safety of antiepileptic drug use during pregnancy. What side effects may I notice from receiving this medicine? Side effects that you should report to your doctor or health care professional as soon as possible: -allergic reactions like skin rash, itching or hives, swelling of the face, lips, or tongue -decreased sweating and/or rise in body temperature -depression -difficulty breathing, fast or irregular breathing patterns -difficulty speaking -difficulty walking or controlling muscle movements -hearing impairment -redness, blistering, peeling or loosening of the skin, including inside the mouth -tingling, pain or numbness in the hands or feet -unusual bleeding or bruising -unusually weak or tired -worsening of mood, thoughts or actions of suicide or dying Side effects that usually do not require medical attention (report to your doctor or health care professional if they continue or are bothersome): -altered taste -back pain, joint or muscle aches and pains -diarrhea, or constipation -headache -loss of appetite -nausea -stomach upset, indigestion -tremors This list may not describe all possible side effects. Call your doctor for medical advice about side effects. You may report side effects to FDA at 1-800-FDA-1088. Where should I keep my medicine? Keep out of the reach of children. Store at room  temperature between 15 and 30 degrees C (59 and 86 degrees F) in a tightly closed container. Protect from moisture. Throw away any unused medicine after the expiration date. NOTE: This sheet is a summary. It may not cover all possible information. If you have questions about this medicine, talk to your doctor, pharmacist, or health care provider.  2018  Elsevier/Gold Standard (2013-06-13 23:17:57)

## 2017-05-07 ENCOUNTER — Telehealth: Payer: Self-pay | Admitting: *Deleted

## 2017-05-07 LAB — MICROSCOPIC EXAMINATION: Casts: NONE SEEN /lpf

## 2017-05-07 LAB — URINALYSIS, COMPLETE
BILIRUBIN UA: NEGATIVE
GLUCOSE, UA: NEGATIVE
KETONES UA: NEGATIVE
LEUKOCYTES UA: NEGATIVE
Nitrite, UA: NEGATIVE
PROTEIN UA: NEGATIVE
RBC, UA: NEGATIVE
SPEC GRAV UA: 1.025 (ref 1.005–1.030)
Urobilinogen, Ur: 1 mg/dL (ref 0.2–1.0)
pH, UA: 6 (ref 5.0–7.5)

## 2017-05-07 LAB — CBC WITH DIFFERENTIAL/PLATELET
BASOS ABS: 0 10*3/uL (ref 0.0–0.2)
Basos: 0 %
EOS (ABSOLUTE): 0.2 10*3/uL (ref 0.0–0.4)
EOS: 4 %
HEMOGLOBIN: 12.8 g/dL (ref 11.1–15.9)
Hematocrit: 38.8 % (ref 34.0–46.6)
Immature Grans (Abs): 0 10*3/uL (ref 0.0–0.1)
Immature Granulocytes: 0 %
Lymphocytes Absolute: 2.1 10*3/uL (ref 0.7–3.1)
Lymphs: 31 %
MCH: 29.7 pg (ref 26.6–33.0)
MCHC: 33 g/dL (ref 31.5–35.7)
MCV: 90 fL (ref 79–97)
MONOCYTES: 8 %
Monocytes Absolute: 0.6 10*3/uL (ref 0.1–0.9)
Neutrophils Absolute: 3.8 10*3/uL (ref 1.4–7.0)
Neutrophils: 57 %
PLATELETS: 214 10*3/uL (ref 150–379)
RBC: 4.31 x10E6/uL (ref 3.77–5.28)
RDW: 13.5 % (ref 12.3–15.4)
WBC: 6.7 10*3/uL (ref 3.4–10.8)

## 2017-05-07 LAB — CMP14 + ANION GAP
ALBUMIN: 4.1 g/dL (ref 3.5–5.5)
ALK PHOS: 60 IU/L (ref 39–117)
ALT: 17 IU/L (ref 0–32)
ANION GAP: 13 mmol/L (ref 10.0–18.0)
AST: 14 IU/L (ref 0–40)
Albumin/Globulin Ratio: 1.3 (ref 1.2–2.2)
BUN / CREAT RATIO: 11 (ref 9–23)
BUN: 9 mg/dL (ref 6–24)
Bilirubin Total: 0.9 mg/dL (ref 0.0–1.2)
CO2: 25 mmol/L (ref 20–29)
CREATININE: 0.82 mg/dL (ref 0.57–1.00)
Calcium: 9 mg/dL (ref 8.7–10.2)
Chloride: 102 mmol/L (ref 96–106)
GFR calc Af Amer: 101 mL/min/{1.73_m2} (ref >59)
GFR calc non Af Amer: 88 mL/min/{1.73_m2} (ref >59)
Globulin, Total: 3.1 g/dL (ref 1.5–4.5)
Glucose: 91 mg/dL (ref 65–99)
Potassium: 3.5 mmol/L (ref 3.5–5.2)
Sodium: 140 mmol/L (ref 134–144)
TOTAL PROTEIN: 7.2 g/dL (ref 6.0–8.5)

## 2017-05-07 NOTE — Telephone Encounter (Signed)
Stated Dr Criselda PeachesMullen had already called her about CXR result. And [facility] at 1115 AM - stated this is ok .

## 2017-05-07 NOTE — Telephone Encounter (Signed)
-----   Message from Inez CatalinaEmily B Mullen, MD sent at 05/06/2017  3:08 PM EST ----- Also, CXR is negative for any pneumonia.  Thanks!

## 2017-05-07 NOTE — Telephone Encounter (Signed)
-----   Message from Inez CatalinaEmily B Mullen, MD sent at 05/06/2017  4:57 PM EST ----- Can y'all schedule ms. Mermelstein for 11/21 with me at 1115 instead of [facility]?   Thanks!

## 2017-05-07 NOTE — Telephone Encounter (Signed)
Called pt - no answer; left message to call us back about test result.

## 2017-05-07 NOTE — Telephone Encounter (Signed)
Pt called

## 2017-05-13 ENCOUNTER — Encounter: Payer: Self-pay | Admitting: Internal Medicine

## 2017-05-13 ENCOUNTER — Ambulatory Visit (INDEPENDENT_AMBULATORY_CARE_PROVIDER_SITE_OTHER): Payer: 59 | Admitting: Internal Medicine

## 2017-05-13 ENCOUNTER — Ambulatory Visit: Payer: 59

## 2017-05-13 VITALS — BP 130/76 | HR 70 | Temp 97.8°F | Wt 288.0 lb

## 2017-05-13 DIAGNOSIS — I10 Essential (primary) hypertension: Secondary | ICD-10-CM

## 2017-05-13 DIAGNOSIS — B349 Viral infection, unspecified: Secondary | ICD-10-CM | POA: Diagnosis not present

## 2017-05-13 DIAGNOSIS — R490 Dysphonia: Secondary | ICD-10-CM

## 2017-05-13 DIAGNOSIS — Z87891 Personal history of nicotine dependence: Secondary | ICD-10-CM

## 2017-05-13 DIAGNOSIS — Z79899 Other long term (current) drug therapy: Secondary | ICD-10-CM

## 2017-05-13 DIAGNOSIS — J45909 Unspecified asthma, uncomplicated: Secondary | ICD-10-CM

## 2017-05-13 DIAGNOSIS — R631 Polydipsia: Secondary | ICD-10-CM | POA: Diagnosis not present

## 2017-05-13 DIAGNOSIS — Z8701 Personal history of pneumonia (recurrent): Secondary | ICD-10-CM

## 2017-05-13 DIAGNOSIS — R3589 Other polyuria: Secondary | ICD-10-CM

## 2017-05-13 DIAGNOSIS — R358 Other polyuria: Secondary | ICD-10-CM

## 2017-05-13 DIAGNOSIS — G43909 Migraine, unspecified, not intractable, without status migrainosus: Secondary | ICD-10-CM

## 2017-05-13 DIAGNOSIS — G43919 Migraine, unspecified, intractable, without status migrainosus: Secondary | ICD-10-CM

## 2017-05-13 NOTE — Assessment & Plan Note (Signed)
She has had a multi year history of chronic viral illnesses that last for months in the winter.  This seems very similar to previous illnesses she has had.  She has been told in the past that she needs her tonsils out, but she moved and had to change doctors.  She would like to see ENT again.  I do not think she needs Abx at this time as her CXR was normal and she is improving.   Plan ENT referral Continue supportive care.

## 2017-05-13 NOTE — Assessment & Plan Note (Signed)
BP much improved today to 130/76.  Continue to monitor on current medications.   Plan Continue amlodipine.  Renal function at last check was normal.

## 2017-05-13 NOTE — Assessment & Plan Note (Signed)
We checked a random glucose last week which was 91.  She had a UA with no glucose or ketones.  I think DM is unlikely.  She could have DI, for which we will recheck a Na and urine Osm today.   Plan If Na and Urine Osm normal, will plan to discuss lifestyle changes to see if this helps.

## 2017-05-13 NOTE — Assessment & Plan Note (Signed)
Improving.  I think she may have had a mix of overmedication headache and some issues related to her current viral illness.  Off of excedrin, she is slowly improving.  Hopefully the topomax will help any further migraines from developing.   Plan Continue Topamax daily Tylenol only for pain Avoid NSAIDs

## 2017-05-13 NOTE — Progress Notes (Signed)
   Subjective:    Patient ID: Stacey Price, female    DOB: [birth year], 41 y.o.   MRN: [id number]  CC: 1 week follow up for viral illness and migraine  HPI  Stacey Price is a 41yo woman with PMH of migraine, HTN, allergic rhinitis, asthma who presents for 1 week follow up of headache and viral illness that has been prolonged.    Today, Stacey Price reports that her viral illness is slowly improving.  She tells me as well that she has something similar to this every winter.  Sometimes the symptoms last for 3-4 months where she will have hoarseness, sore throat and intermittent fevers, chills, runny nose, cough.  Today, she just has persistent hoarseness and sore throat.  She has been told by a previous ENT physician that she would likely need her tonsils out.  She has been on antibiotics for it before and these did not help.  She has had "walking pneumonia" before and this was the only time antibiotics seemed to help.  She was happy to hear her CXR was normal from last visit.   As concerns her migraine, her headaches are improving.  She is taking topamax 50mg  daily and stopped her excedrin and this has seemed to help.    She continues to have polyuria.  She had a random glucose of 91.  No ketones or glucose in the urine.  We discussed possible DI and she is interested in being checked for this.    Review of Systems  Constitutional: Negative for appetite change, fatigue and fever.  HENT: Positive for congestion, rhinorrhea, sore throat and voice change. Negative for sinus pressure and sinus pain.   Eyes: Negative for pain and redness.  Endocrine: Positive for polydipsia and polyuria.  Neurological: Negative for dizziness and weakness.       Objective:   Physical Exam  Constitutional: She is oriented to person, place, and time. She appears well-developed and well-nourished. No distress.  HENT:  Head: Normocephalic and atraumatic.  Nose: Mucosal edema and rhinorrhea present. No nose lacerations  or nasal deformity. Right sinus exhibits frontal sinus tenderness. Right sinus exhibits no maxillary sinus tenderness. Left sinus exhibits frontal sinus tenderness. Left sinus exhibits no maxillary sinus tenderness.  Mouth/Throat: No oropharyngeal exudate, posterior oropharyngeal edema or posterior oropharyngeal erythema.  Pulmonary/Chest: Effort normal. No respiratory distress.  Neurological: She is alert and oriented to person, place, and time.  Psychiatric: She has a normal mood and affect. Her behavior is normal.   BMET and Urine Osm today       Assessment & Plan:  RTC in 3 months, sooner if needed or after ENT appointment.

## 2017-05-13 NOTE — Patient Instructions (Signed)
[name] - -  For your polyuria, we will be checking for a disorder known as Diabetes insipidus today with labwork. I will call you with results next week.    For your hoarseness, please go and see ENT for an opinion.  Thank you!

## 2017-05-14 LAB — BMP8+ANION GAP
Anion Gap: 11 mmol/L (ref 10.0–18.0)
BUN / CREAT RATIO: 13 (ref 9–23)
BUN: 10 mg/dL (ref 6–24)
CHLORIDE: 107 mmol/L — AB (ref 96–106)
CO2: 22 mmol/L (ref 20–29)
Calcium: 8.6 mg/dL — ABNORMAL LOW (ref 8.7–10.2)
Creatinine, Ser: 0.75 mg/dL (ref 0.57–1.00)
GFR calc non Af Amer: 98 mL/min/{1.73_m2} (ref >59)
GFR, EST AFRICAN AMERICAN: 113 mL/min/{1.73_m2} (ref >59)
GLUCOSE: 89 mg/dL (ref 65–99)
POTASSIUM: 4 mmol/L (ref 3.5–5.2)
Sodium: 140 mmol/L (ref 134–144)

## 2017-05-15 LAB — OSMOLALITY, URINE: OSMOLALITY UR: 829 mosm/kg

## 2017-05-19 ENCOUNTER — Encounter: Payer: Self-pay | Admitting: Internal Medicine

## 2017-05-19 DIAGNOSIS — J040 Acute laryngitis: Secondary | ICD-10-CM | POA: Diagnosis not present

## 2017-05-19 DIAGNOSIS — R490 Dysphonia: Secondary | ICD-10-CM | POA: Diagnosis not present

## 2017-05-19 DIAGNOSIS — J029 Acute pharyngitis, unspecified: Secondary | ICD-10-CM | POA: Diagnosis not present

## 2017-05-26 ENCOUNTER — Encounter: Payer: Self-pay | Admitting: Internal Medicine

## 2017-05-26 ENCOUNTER — Ambulatory Visit (INDEPENDENT_AMBULATORY_CARE_PROVIDER_SITE_OTHER): Payer: 59 | Admitting: Internal Medicine

## 2017-05-26 VITALS — BP 128/72 | HR 70 | Temp 98.1°F | Wt 287.7 lb

## 2017-05-26 DIAGNOSIS — R358 Other polyuria: Secondary | ICD-10-CM | POA: Diagnosis not present

## 2017-05-26 DIAGNOSIS — R490 Dysphonia: Secondary | ICD-10-CM | POA: Diagnosis not present

## 2017-05-26 DIAGNOSIS — R3589 Other polyuria: Secondary | ICD-10-CM

## 2017-05-26 DIAGNOSIS — Z79899 Other long term (current) drug therapy: Secondary | ICD-10-CM | POA: Diagnosis not present

## 2017-05-26 NOTE — Assessment & Plan Note (Signed)
Patient inquired about her test results. She was informed that her Na and Urine Osm were normal. She has had her glucose check in the past which was normal. Patient also has had negative U/A in the past with no other signs or symptoms of UTI. She states that she urinates almost hourly and each urination is a normal quantity. Patient denies any symptoms of dehydration or hypotension and is normotensive during today's visit. She denies difficulty making it to the bathroom (incontinnecnec). - Follow up with PCP to discuss further lifestyle changes as previously planned

## 2017-05-26 NOTE — Assessment & Plan Note (Addendum)
As stated in previous documentation, patient has had similar chronic viral illnesses lasting for weeks during the winter for multiple years, which have all been similar to her recent/current illness. Patient remains afebrile and otherwise healthy. She was referred to ENT, who (per patient) found no polyps and no need for removal of her tonsils. They did(per patient report) start her on a PPI for possible Laryngopharyngeal Reflux causing her hoarseness. - Continue Omeprazole, monitor for response - Continue supportive care for viral symptoms  - Patient given second return to work note for her other job and a copy of her previous return to work note upon request.

## 2017-05-26 NOTE — Patient Instructions (Signed)
Thank you for allowing us to care for you   For your hoarseness: - Continue to take Omeprazole as instructed by your ENT physician - Continue to treat cold symptoms as needed  For your chronic increased urination: - Your lab test for diabetes insipidus came back negative - Follow up with your PCP in about 3 months for further monitoring

## 2017-05-26 NOTE — Progress Notes (Signed)
   CC: Hoarseness, polyuria  HPI:  [name] is a 41 y.o. F with PMHx listed below presenting for hoarseness, polyuria. Please see the A&P for the status of the patient's chronic medical problems.   Past Medical History:  Diagnosis Date  . Asthma   . Hypertension   . Obesity    Review of Systems: Performed and negative except as otherwise indicated.  Physical Exam:  Vitals:   05/26/17 1420  BP: 128/72  Pulse: 70  Temp: 98.1 F (36.7 C)  TempSrc: Oral  SpO2: 100%  Weight: 287 lb 11.2 oz (130.5 kg)   Physical Exam  HENT:  Mouth/Throat: Oropharynx is clear and moist.  Cardiovascular: Normal rate, regular rhythm and normal heart sounds.  Pulmonary/Chest: Effort normal and breath sounds normal. No respiratory distress.  Abdominal: Soft. Bowel sounds are normal. She exhibits no distension. There is no tenderness.  Skin: Skin is warm and dry.     Assessment & Plan:   See Encounters Tab for problem based charting.  Patient seen with Dr. Rogelia BogaButcher

## 2017-05-27 NOTE — Progress Notes (Signed)
Internal Medicine Clinic Attending  I saw and evaluated the patient.  I personally confirmed the key portions of the history and exam documented by Dr. Melvin and I reviewed pertinent patient test results.  The assessment, diagnosis, and plan were formulated together and I agree with the documentation in the resident's note.  

## 2017-07-01 ENCOUNTER — Encounter: Payer: Self-pay | Admitting: Internal Medicine

## 2017-07-16 ENCOUNTER — Ambulatory Visit (HOSPITAL_COMMUNITY)
Admission: RE | Admit: 2017-07-16 | Discharge: 2017-07-16 | Disposition: A | Discharge status: Home / Self Care | Payer: 59 | Source: Ambulatory Visit | Attending: Internal Medicine | Admitting: Internal Medicine

## 2017-07-16 ENCOUNTER — Telehealth: Payer: Self-pay | Admitting: *Deleted

## 2017-07-16 ENCOUNTER — Other Ambulatory Visit: Payer: Self-pay

## 2017-07-16 ENCOUNTER — Encounter: Payer: Self-pay | Admitting: Internal Medicine

## 2017-07-16 ENCOUNTER — Ambulatory Visit (INDEPENDENT_AMBULATORY_CARE_PROVIDER_SITE_OTHER): Payer: 59 | Admitting: Internal Medicine

## 2017-07-16 VITALS — BP 140/72 | HR 63 | Temp 97.5°F | Ht 67.0 in | Wt 288.2 lb

## 2017-07-16 DIAGNOSIS — G43909 Migraine, unspecified, not intractable, without status migrainosus: Secondary | ICD-10-CM | POA: Diagnosis not present

## 2017-07-16 DIAGNOSIS — E669 Obesity, unspecified: Secondary | ICD-10-CM

## 2017-07-16 DIAGNOSIS — Z87891 Personal history of nicotine dependence: Secondary | ICD-10-CM

## 2017-07-16 DIAGNOSIS — R0681 Apnea, not elsewhere classified: Secondary | ICD-10-CM | POA: Diagnosis not present

## 2017-07-16 DIAGNOSIS — R002 Palpitations: Secondary | ICD-10-CM

## 2017-07-16 DIAGNOSIS — I10 Essential (primary) hypertension: Secondary | ICD-10-CM

## 2017-07-16 DIAGNOSIS — R0683 Snoring: Secondary | ICD-10-CM | POA: Diagnosis not present

## 2017-07-16 DIAGNOSIS — Z6841 Body Mass Index (BMI) 40.0 and over, adult: Secondary | ICD-10-CM

## 2017-07-16 NOTE — Telephone Encounter (Signed)
Pt calls and states for 3 days she has had chest "palpatations" denies N&V, short of breath, weakness, h/a. Ask to go to ED would prefer clinic. appt [facility] at 1515

## 2017-07-16 NOTE — Progress Notes (Signed)
   CC: palpitations  HPI:  Ms.Natara Azucena CecilBurton is a 42 y.o. with a PMH of HTN, migraines, obesity presenting to clinic for palpitations.  Palpitations: Patient endorses a prior history of palpitations that had been resolved, but recurred about 3 days ago. She endorses multiple episodes throughout the day, they do wake her up at night; she endorses associated symptoms of sharp chest pain (at times), mild dyspnea, and mild increase in chronic dizziness. She denies syncopal episodes, vision changes. Episodes occur at rest and with activity. She notes increased stress at home over the last few weeks. She endorse inc bil LE edema w/o cal pain. She denies recent use of albuterol, does not take any over the counter medications, does not smoke or use illicit drug use. She denies personal h/o VTE; she thinks her mom had a DVT post op. No early deaths in family or known heart disease.  Apneic episodes: Patient states that her partner has told her she has several apneic episodes throughout the night + snoring. She endorses an almost daily, global headaches that she states is worsened with bending forward (pain in occipital region when bending forward). She also notes some increased swelling in bil LEs.   Please see problem based Assessment and Plan for status of patients chronic conditions.  Past Medical History:  Diagnosis Date  . Asthma   . Hypertension   . Obesity     Review of Systems:   ROS Per HPI  Physical Exam:  Vitals:   07/16/17 1509  BP: 140/72  Pulse: 63  Temp: (!) 97.5 F (36.4 C)  TempSrc: Oral  SpO2: 100%  Weight: 288 lb 3.2 oz (130.7 kg)  Height: 5\' 7"  (1.702 m)   GENERAL- alert, co-operative, appears as stated age, not in any distress. HEENT- Atraumatic, normocephalic, oral mucosa appears moist CARDIAC- RRR, no murmurs, rubs or gallops. RESP- Moving equal volumes of air, and clear to auscultation bilaterally, no wheezes or crackles. ABDOMEN- Soft, nontender, bowel sounds  present. NEURO- No obvious Cr N abnormality. EXTREMITIES- pulse 2+, symmetric, no LE edema. SKIN- Warm, dry. PSYCH- Normal mood and affect, appropriate thought content and speech.  Assessment & Plan:   See Encounters Tab for problem based charting.   Patient discussed with Dr. Fredrich RomansMullen   Hamza Empson, MD Internal Medicine PGY2

## 2017-07-16 NOTE — Patient Instructions (Addendum)
I have placed an order for a heart monitor that you will wear for 2 days; this will record your heart rhythm and let us know if your heart is beating abnormally during your episodes.  We'll also check your thyroid level today.

## 2017-07-17 LAB — TSH: TSH: 1.31 u[IU]/mL (ref 0.450–4.500)

## 2017-07-18 ENCOUNTER — Encounter: Payer: Self-pay | Admitting: Internal Medicine

## 2017-07-18 DIAGNOSIS — R0683 Snoring: Secondary | ICD-10-CM | POA: Insufficient documentation

## 2017-07-18 NOTE — Assessment & Plan Note (Signed)
Patient endorses a prior history of palpitations that had been resolved, but recurred about 3 days ago. She endorses multiple episodes throughout the day, they do wake her up at night; she endorses associated symptoms of sharp chest pain (at times), mild dyspnea, and mild increase in chronic dizziness. She denies syncopal episodes, vision changes. Episodes occur at rest and with activity. She notes increased stress at home over the last few weeks.   Exam shows RRR, no murmurs, clear lungs, no LE edema, calf tenderness.  EKG - personally reviewed - SR, no ischemic changes, rhythm strip w/o arrhythmias.  Uncertain etiology of palpitations; could be related to increased stress but night-time awakenings would not always be expected; no signs of PE/DVT, no signs of structural heart disease.  Plan: --48hr halter monitor --check TSH - wnl --has f/u in 1 week

## 2017-07-18 NOTE — Assessment & Plan Note (Signed)
Patient states that her partner has told her she has several apneic episodes throughout the night + snoring. She endorses an almost daily, global headaches that she states is worsened with bending forward (pain in occipital region when bending forward). She also notes some increased swelling in bil LEs.   Patient obese, with hypertension, daily headaches; presentation concerning for OSA.  Plan: --briefly discussed possibility of OSA and workup --she will discuss further with PCP next week with possible referral for sleep study

## 2017-07-20 NOTE — Progress Notes (Signed)
Internal Medicine Clinic Attending  Case discussed with Dr. Svalina  at the time of the visit.  We reviewed the resident's history and exam and pertinent patient test results.  I agree with the assessment, diagnosis, and plan of care documented in the resident's note.  

## 2017-07-22 ENCOUNTER — Telehealth: Payer: Self-pay | Admitting: Internal Medicine

## 2017-07-22 ENCOUNTER — Other Ambulatory Visit: Payer: Self-pay

## 2017-07-22 ENCOUNTER — Encounter: Payer: Self-pay | Admitting: Internal Medicine

## 2017-07-22 ENCOUNTER — Other Ambulatory Visit (HOSPITAL_COMMUNITY)
Admission: RE | Admit: 2017-07-22 | Discharge: 2017-07-22 | Disposition: A | Discharge status: Home / Self Care | Payer: 59 | Source: Ambulatory Visit | Attending: Internal Medicine | Admitting: Internal Medicine

## 2017-07-22 ENCOUNTER — Ambulatory Visit (INDEPENDENT_AMBULATORY_CARE_PROVIDER_SITE_OTHER): Payer: 59 | Admitting: Internal Medicine

## 2017-07-22 VITALS — BP 130/63 | HR 66 | Temp 98.0°F | Ht 67.0 in | Wt 291.3 lb

## 2017-07-22 DIAGNOSIS — G43909 Migraine, unspecified, not intractable, without status migrainosus: Secondary | ICD-10-CM | POA: Diagnosis not present

## 2017-07-22 DIAGNOSIS — N898 Other specified noninflammatory disorders of vagina: Secondary | ICD-10-CM | POA: Diagnosis not present

## 2017-07-22 DIAGNOSIS — F419 Anxiety disorder, unspecified: Secondary | ICD-10-CM

## 2017-07-22 DIAGNOSIS — F4310 Post-traumatic stress disorder, unspecified: Secondary | ICD-10-CM

## 2017-07-22 DIAGNOSIS — G44209 Tension-type headache, unspecified, not intractable: Secondary | ICD-10-CM

## 2017-07-22 DIAGNOSIS — R300 Dysuria: Secondary | ICD-10-CM | POA: Insufficient documentation

## 2017-07-22 DIAGNOSIS — I10 Essential (primary) hypertension: Secondary | ICD-10-CM

## 2017-07-22 DIAGNOSIS — Z8742 Personal history of other diseases of the female genital tract: Secondary | ICD-10-CM | POA: Diagnosis not present

## 2017-07-22 DIAGNOSIS — Z79899 Other long term (current) drug therapy: Secondary | ICD-10-CM | POA: Diagnosis not present

## 2017-07-22 DIAGNOSIS — R21 Rash and other nonspecific skin eruption: Secondary | ICD-10-CM | POA: Diagnosis not present

## 2017-07-22 DIAGNOSIS — R002 Palpitations: Secondary | ICD-10-CM

## 2017-07-22 DIAGNOSIS — G43919 Migraine, unspecified, intractable, without status migrainosus: Secondary | ICD-10-CM

## 2017-07-22 DIAGNOSIS — Z87891 Personal history of nicotine dependence: Secondary | ICD-10-CM | POA: Diagnosis not present

## 2017-07-22 MED ORDER — KETOCONAZOLE 2 % EX CREA: 1.0000 "application " | TOPICAL_CREAM | Freq: Every day | CUTANEOUS | 0 refills | Status: DC

## 2017-07-22 MED ORDER — TOPIRAMATE 50 MG PO TABS: ORAL_TABLET | ORAL | 3 refills | Status: DC

## 2017-07-22 MED ORDER — SUMATRIPTAN SUCCINATE 50 MG PO TABS: 50.0000 mg | ORAL_TABLET | Freq: Once | ORAL | 2 refills | Status: DC | PRN

## 2017-07-22 NOTE — Telephone Encounter (Signed)
I have called the patient and left a VM to call me back to schedule the 48 hour monitor.

## 2017-07-22 NOTE — Assessment & Plan Note (Signed)
Holter monitor will be set up today.  She has had a normal TSH recently.

## 2017-07-22 NOTE — Assessment & Plan Note (Signed)
She has had issues in the past, thought to be eczema.  Creams not working.  Now with a new annular plaque, possibly ringworm.   Trial of ketoconazole cream, she is to call back if no better, possibly will need biopsy.

## 2017-07-22 NOTE — Progress Notes (Signed)
   Subjective:    Patient ID: Stacey Price, female    DOB: 01-21-1976, 42 y.o.   MRN: [id number]  2 week follow up for paperwork  HPI   Stacey Price is a 42yo woman with PMH of well controlled HTN, Migraine, allergies, presumed eczema who presents for paperwork to be filled out.   She has 2 acute issues to discuss this morning, migraines and a new foul odor since changing soaps.    She notes that her migraines continues.  They come on every other day or every day, associated with nausea, blurry vision, phonophobia and photophobia.  She tries to continue to work with them. She has stopped NSAIDs.  She was previously on a triptan and topamax but ran out of both.  She has well controlled HTN.   She further notes that she thinks her BV is back.  She has switched soaps and feels that this brings it on.  She has a foul odor discharge which is thin.  She is interested in getting tested today.  She left a urine sample.   She was seen a few weeks ago for palpitations and we will make sure her holter monitor is set up for her today.  She needs paperwork filled out so that she can continue to see her psychologist (treated for anxiety, PTSD) which was done.    At the end of the visit, she showed me a new annular rash on her right ankle.  She notes that it is larger than her previous issues with eczema, very itchy and flaky.  She has no animals.  She has not tried anything on it.    Review of Systems  Constitutional: Negative for activity change and fatigue.  HENT: Negative for voice change.   Eyes: Positive for photophobia and visual disturbance.  Skin: Positive for color change and rash. Negative for pallor.  Neurological: Positive for headaches. Negative for speech difficulty and weakness.       Objective:   Physical Exam  Constitutional: She is oriented to person, place, and time. She appears well-developed and well-nourished. No distress.  HENT:  Head: Normocephalic and atraumatic.    Musculoskeletal: She exhibits no edema or tenderness.  Neurological: She is alert and oriented to person, place, and time.  Skin: No pallor.  She has a 2 CM well demarcated annular plaque on medial right ankle which has some heaping at the edges, but no central clearing at this time.  Some flaking on top.   Psychiatric: She has a normal mood and affect. Her behavior is normal.  Vitals reviewed.   Urine sample today.       Assessment & Plan:  RTC as planned on 2/20, sooner if needed.   Foul smelling urine - Urine probe for BV and others today, follow and call patient with results.   Paperwork - FMLA paperwork filled out so that she is able to attend her therapy sessions.

## 2017-07-22 NOTE — Assessment & Plan Note (Signed)
Uncontrolled, currently on no meds.    Plan For Abortive therapy, sumatriptan 50mg  at the start of the migraine and then 2 hours later if not improved For control: Topamax 50mg  daily, up titrate to 50mg  BID if she tolerates.

## 2017-07-22 NOTE — Assessment & Plan Note (Signed)
BP is well controlled today.  She is on amlodipine with good results.   Plan  Continue amlodipine.

## 2017-07-22 NOTE — Patient Instructions (Signed)
Stacey Price - -  Your paperwork will be on file with us if you misplace it.   For your migraines -   Restart Topamax 50mg  daily for 7 days.  Then Increase to 50mg  twice a day.  Please call for refills.   When a migraine comes on, Try Imitrex, 50mg  at the beginning of the pain and then can re-dose at 2 hours.   For your rash, try ketoconazole cream for 1-2 weeks and see if it improves.  If not, come back in for me to look at it again.   Thanks!  Sumatriptan tablets What is this medicine? SUMATRIPTAN (soo ma TRIP tan) is used to treat migraines with or without aura. An aura is a strange feeling or visual disturbance that warns you of an attack. It is not used to prevent migraines. This medicine may be used for other purposes; ask your health care provider or pharmacist if you have questions. COMMON BRAND NAME(S): Imitrex, Migraine Pack What should I tell my health care provider before I take this medicine? They need to know if you have any of these conditions: -circulation problems in fingers and toes -diabetes -heart disease -high blood pressure -high cholesterol -history of irregular heartbeat -history of stroke -kidney disease -liver disease -postmenopausal or surgical removal of uterus and ovaries -seizures -smoke tobacco -stomach or intestine problems -an unusual or allergic reaction to sumatriptan, other medicines, foods, dyes, or preservatives -pregnant or trying to get pregnant -breast-feeding How should I use this medicine? Take this medicine by mouth with a glass of water. Follow the directions on the prescription label. This medicine is taken at the first symptoms of a migraine. It is not for everyday use. If your migraine headache returns after one dose, you can take another dose as directed. You must leave at least 2 hours between doses, and do not take more than 100 mg as a single dose. Do not take more than 200 mg total in any 24 hour period. If there is no  improvement at all after the first dose, do not take a second dose without talking to your doctor or health care professional. Do not take your medicine more often than directed. Talk to your pediatrician regarding the use of this medicine in children. Special care may be needed. Overdosage: If you think you have taken too much of this medicine contact a poison control center or emergency room at once. NOTE: This medicine is only for you. Do not share this medicine with others. What if I miss a dose? This does not apply; this medicine is not for regular use. What may interact with this medicine? Do not take this medicine with any of the following medicines: -cocaine -ergot alkaloids like dihydroergotamine, ergonovine, ergotamine, methylergonovine -feverfew -MAOIs like Carbex, Eldepryl, Marplan, Nardil, and Parnate -other medicines for migraine headache like almotriptan, eletriptan, frovatriptan, naratriptan, rizatriptan, zolmitriptan -tryptophan This medicine may also interact with the following medications: -certain medicines for depression, anxiety, or psychotic disturbances This list may not describe all possible interactions. Give your health care provider a list of all the medicines, herbs, non-prescription drugs, or dietary supplements you use. Also tell them if you smoke, drink alcohol, or use illegal drugs. Some items may interact with your medicine. What should I watch for while using this medicine? Only take this medicine for a migraine headache. Take it if you get warning symptoms or at the start of a migraine attack. It is not for regular use to prevent migraine attacks. You  may get drowsy or dizzy. Do not drive, use machinery, or do anything that needs mental alertness until you know how this medicine affects you. To reduce dizzy or fainting spells, do not sit or stand up quickly, especially if you are an older patient. Alcohol can increase drowsiness, dizziness and flushing. Avoid  alcoholic drinks. Smoking cigarettes may increase the risk of heart-related side effects from using this medicine. If you take migraine medicines for 10 or more days a month, your migraines may get worse. Keep a diary of headache days and medicine use. Contact your healthcare professional if your migraine attacks occur more frequently. What side effects may I notice from receiving this medicine? Side effects that you should report to your doctor or health care professional as soon as possible: -allergic reactions like skin rash, itching or hives, swelling of the face, lips, or tongue -bloody or watery diarrhea -hallucination, loss of contact with reality -pain, tingling, numbness in the face, hands, or feet -seizures -signs and symptoms of a blood clot such as breathing problems; changes in vision; chest pain; severe, sudden headache; pain, swelling, warmth in the leg; trouble speaking; sudden numbness or weakness of the face, arm, or leg -signs and symptoms of a dangerous change in heartbeat or heart rhythm like chest pain; dizziness; fast or irregular heartbeat; palpitations, feeling faint or lightheaded; falls; breathing problems -signs and symptoms of a stroke like changes in vision; confusion; trouble speaking or understanding; severe headaches; sudden numbness or weakness of the face, arm, or leg; trouble walking; dizziness; loss of balance or coordination -stomach pain Side effects that usually do not require medical attention (report to your doctor or health care professional if they continue or are bothersome): -changes in taste -facial flushing -headache -muscle cramps -muscle pain -nausea, vomiting -weak or tired This list may not describe all possible side effects. Call your doctor for medical advice about side effects. You may report side effects to FDA at 1-800-FDA-1088. Where should I keep my medicine? Keep out of the reach of children. Store at room temperature between 2 and  30 degrees C (36 and 86 degrees F). Throw away any unused medicine after the expiration date. NOTE: This sheet is a summary. It may not cover all possible information. If you have questions about this medicine, talk to your doctor, pharmacist, or health care provider.  2018 Elsevier/Gold Standard (2015-07-12 12:38:23)

## 2017-07-23 ENCOUNTER — Telehealth: Payer: Self-pay | Admitting: Internal Medicine

## 2017-07-23 ENCOUNTER — Emergency Department (HOSPITAL_BASED_OUTPATIENT_CLINIC_OR_DEPARTMENT_OTHER): Admission: EM | Admit: 2017-07-23 | Discharge: 2017-07-23 | Discharge status: Against Medical Advice | Payer: 59

## 2017-07-23 LAB — URINE CYTOLOGY ANCILLARY ONLY
CHLAMYDIA, DNA PROBE: NEGATIVE
NEISSERIA GONORRHEA: NEGATIVE
Trichomonas: NEGATIVE

## 2017-07-23 NOTE — Telephone Encounter (Signed)
Patient would like a call back about which Migraine medication to take.

## 2017-07-23 NOTE — Telephone Encounter (Signed)
Called pt, discussed the way to take her new med, she repeated back instructions. She will f/u in 4 weeks

## 2017-07-27 LAB — URINE CYTOLOGY ANCILLARY ONLY
Bacterial vaginitis: NEGATIVE
CANDIDA VAGINITIS: NEGATIVE

## 2017-07-28 ENCOUNTER — Ambulatory Visit (INDEPENDENT_AMBULATORY_CARE_PROVIDER_SITE_OTHER): Payer: 59

## 2017-07-28 DIAGNOSIS — R002 Palpitations: Secondary | ICD-10-CM | POA: Diagnosis not present

## 2017-07-29 ENCOUNTER — Telehealth: Payer: Self-pay | Admitting: Internal Medicine

## 2017-07-29 NOTE — Telephone Encounter (Signed)
Called patient to discuss results, all negative.   Next step would be pelvic exam/maybe pap.   She will come in on 2/20 to have this done.  She will call clinic if things worsen.    Debe CoderEmily Evalina Tabak, MD

## 2017-08-03 ENCOUNTER — Telehealth: Payer: Self-pay | Admitting: Internal Medicine

## 2017-08-03 NOTE — Telephone Encounter (Signed)
Called and discussed results with Ms. Forcier.  Discussed benign nature and possible need for a beta blocker.  Would also like to get her a TTE in the future.  She is due to be seen on 2/20 and we will discuss further work up at that time.

## 2017-08-03 NOTE — Telephone Encounter (Signed)
Patient is calling about results for heart monitor. pls contact pt

## 2017-08-05 ENCOUNTER — Telehealth: Payer: Self-pay

## 2017-08-05 DIAGNOSIS — R002 Palpitations: Secondary | ICD-10-CM

## 2017-08-05 NOTE — Telephone Encounter (Signed)
Pt wants to see dr Lennox Soldersghanji, cardiac, does she need to come in to get a referral or can you do without appt, she has called dr Oliver Barreghanji's office and they said they needed a referral and records

## 2017-08-05 NOTE — Telephone Encounter (Signed)
Needs to speak with a nurse about something. 

## 2017-08-06 NOTE — Telephone Encounter (Signed)
She can go for an opinion for her palpitations.  I would be happy to place the consult.  I just saw her and addressed this issue so will place consult.  Thanks.

## 2017-08-07 ENCOUNTER — Other Ambulatory Visit: Payer: Self-pay | Admitting: Internal Medicine

## 2017-08-12 ENCOUNTER — Other Ambulatory Visit: Payer: Self-pay

## 2017-08-12 ENCOUNTER — Ambulatory Visit (INDEPENDENT_AMBULATORY_CARE_PROVIDER_SITE_OTHER): Payer: 59 | Admitting: Internal Medicine

## 2017-08-12 ENCOUNTER — Encounter: Payer: Self-pay | Admitting: Internal Medicine

## 2017-08-12 ENCOUNTER — Other Ambulatory Visit (HOSPITAL_COMMUNITY)
Admission: RE | Admit: 2017-08-12 | Discharge: 2017-08-12 | Disposition: A | Discharge status: Home / Self Care | Payer: 59 | Source: Ambulatory Visit | Attending: Internal Medicine | Admitting: Internal Medicine

## 2017-08-12 VITALS — BP 128/74 | HR 68 | Temp 98.0°F | Ht 67.0 in | Wt 289.1 lb

## 2017-08-12 DIAGNOSIS — J029 Acute pharyngitis, unspecified: Secondary | ICD-10-CM

## 2017-08-12 DIAGNOSIS — R490 Dysphonia: Secondary | ICD-10-CM

## 2017-08-12 DIAGNOSIS — N898 Other specified noninflammatory disorders of vagina: Secondary | ICD-10-CM | POA: Insufficient documentation

## 2017-08-12 DIAGNOSIS — R0982 Postnasal drip: Secondary | ICD-10-CM | POA: Diagnosis not present

## 2017-08-12 DIAGNOSIS — R300 Dysuria: Secondary | ICD-10-CM | POA: Diagnosis not present

## 2017-08-12 DIAGNOSIS — R002 Palpitations: Secondary | ICD-10-CM | POA: Diagnosis not present

## 2017-08-12 DIAGNOSIS — F4310 Post-traumatic stress disorder, unspecified: Secondary | ICD-10-CM | POA: Diagnosis not present

## 2017-08-12 DIAGNOSIS — F339 Major depressive disorder, recurrent, unspecified: Secondary | ICD-10-CM

## 2017-08-12 DIAGNOSIS — J3489 Other specified disorders of nose and nasal sinuses: Secondary | ICD-10-CM | POA: Diagnosis not present

## 2017-08-12 DIAGNOSIS — R0981 Nasal congestion: Secondary | ICD-10-CM | POA: Diagnosis not present

## 2017-08-12 DIAGNOSIS — G43909 Migraine, unspecified, not intractable, without status migrainosus: Secondary | ICD-10-CM

## 2017-08-12 NOTE — Assessment & Plan Note (Signed)
She is douching and using chemicals, which may be the causative factor.  Other things in the differential includes infectious vaginitis, urethritis, PID (less likely with no fever or pain) and painful bladder syndrome.    Plan PAP smear, wet prep today Repeat UA If all normal, will have her stop douching, etc and see if this helps (irritant vaginitis)

## 2017-08-12 NOTE — Assessment & Plan Note (Signed)
She requested consult with Cardiology which was placed.  I advised a TTE, though she is seeing cardiology this week and this may not be necessary.  Can be cancelled if Cardiology does not think warranted.   Follow up with Cardiology.

## 2017-08-12 NOTE — Patient Instructions (Addendum)
[name] - -  I will let you know the results of the test we did today.    Please schedule the echocardiogram when you have a chance.  More information below.   Come back to see me in 3-4 months, sooner with my partners if you need to!  Thank you!  Echocardiogram An echocardiogram, or echocardiography, uses sound waves (ultrasound) to produce an image of your heart. The echocardiogram is simple, painless, obtained within a short period of time, and offers valuable information to your health care provider. The images from an echocardiogram can provide information such as:  Evidence of coronary artery disease (CAD).  Heart size.  Heart muscle function.  Heart valve function.  Aneurysm detection.  Evidence of a past heart attack.  Fluid buildup around the heart.  Heart muscle thickening.  Assess heart valve function.  Tell a health care provider about:  Any allergies you have.  All medicines you are taking, including vitamins, herbs, eye drops, creams, and over-the-counter medicines.  Any problems you or family members have had with anesthetic medicines.  Any blood disorders you have.  Any surgeries you have had.  Any medical conditions you have.  Whether you are pregnant or may be pregnant. What happens before the procedure? No special preparation is needed. Eat and drink normally. What happens during the procedure?  In order to produce an image of your heart, gel will be applied to your chest and a wand-like tool (transducer) will be moved over your chest. The gel will help transmit the sound waves from the transducer. The sound waves will harmlessly bounce off your heart to allow the heart images to be captured in real-time motion. These images will then be recorded.  You may need an IV to receive a medicine that improves the quality of the pictures. What happens after the procedure? You may return to your normal schedule including diet, activities, and  medicines, unless your health care provider tells you otherwise. This information is not intended to replace advice given to you by your health care provider. Make sure you discuss any questions you have with your health care provider. Document Released: 06/06/2000 Document Revised: 01/26/2016 Document Reviewed: 02/14/2013 Elsevier Interactive Patient Education  2017 [employer]

## 2017-08-12 NOTE — Progress Notes (Signed)
   Subjective:    Patient ID: Lucious Grovesngela Wiederholt, female    DOB: 10/11/1975, 42 y.o.   MRN: [id number]  CC: 2 week follow up for dysuria  HPI  Ms. Azucena CecilBurton is a 42yo woman with PMH of Migraine, depression who presents for foul smelling discharge.  Urine probe from last visit was negative.  She is douching with vinegar and water and using sprays in her underwear.  She denies new sexual partners.   She also reports about 3 days of a viral illness which is associated with sore throat, PND and some mild sinus pressure.  She feels like it might be getting better but is not sure.  She has had no fevers or cough.  No muscle aches.    She is being treated for depression and PTSD.  She thinks that she is on seroquel and a sleep medication, but she recently changed medications.  I asked her to bring in her medications at next visit.    Review of Systems  HENT: Positive for congestion, postnasal drip, rhinorrhea, sinus pain and sore throat.   Respiratory: Negative for cough and shortness of breath.   Genitourinary: Positive for vaginal discharge. Negative for vaginal bleeding and vaginal pain.       Objective:   Physical Exam  Constitutional: She appears well-developed and well-nourished. No distress.  HENT:  Head: Normocephalic and atraumatic.  Mouth/Throat: No oropharyngeal exudate.  Mild hoarseness from sore throat  Pulmonary/Chest: Effort normal. No respiratory distress.  Genitourinary: There is no rash, tenderness, lesion or injury on the right labia. There is no rash, tenderness, lesion or injury on the left labia. Cervix exhibits discharge. Cervix exhibits no motion tenderness and no friability. Right adnexum displays no mass and no tenderness. Left adnexum displays no mass and no tenderness. No erythema, tenderness or bleeding in the vagina. No signs of injury around the vagina. Vaginal discharge (white, thick) found.  Vitals reviewed.   Wet prep, PAP smear sent.   TTE ordered for  palpitations.       Assessment & Plan:  RTC in 3-4 months, sooner if needed.

## 2017-08-13 LAB — URINALYSIS, ROUTINE W REFLEX MICROSCOPIC
Bilirubin, UA: NEGATIVE
GLUCOSE, UA: NEGATIVE
KETONES UA: NEGATIVE
Leukocytes, UA: NEGATIVE
NITRITE UA: NEGATIVE
Protein, UA: NEGATIVE
RBC, UA: NEGATIVE
SPEC GRAV UA: 1.022 (ref 1.005–1.030)
Urobilinogen, Ur: 1 mg/dL (ref 0.2–1.0)
pH, UA: 6 (ref 5.0–7.5)

## 2017-08-13 LAB — CERVICOVAGINAL ANCILLARY ONLY
Bacterial vaginitis: NEGATIVE
CANDIDA VAGINITIS: NEGATIVE
Chlamydia: NEGATIVE
Neisseria Gonorrhea: NEGATIVE
Trichomonas: NEGATIVE

## 2017-08-14 LAB — CYTOLOGY - PAP
Diagnosis: NEGATIVE
HPV (WINDOPATH): NOT DETECTED

## 2017-08-17 ENCOUNTER — Telehealth: Payer: Self-pay | Admitting: Internal Medicine

## 2017-08-17 NOTE — Telephone Encounter (Signed)
Called patient with results, confirmed identity with patient.   All results negative, including wet prep and pap smear.   I think her symptoms may be from douching and using feminine products.  I advised her to stop all chemicals for 2-4 weeks and see if this helps her discharge and symptoms.   She will call with any issues.   Debe CoderEmily Mullen, MD

## 2017-08-20 DIAGNOSIS — R002 Palpitations: Secondary | ICD-10-CM | POA: Diagnosis not present

## 2017-08-20 DIAGNOSIS — I10 Essential (primary) hypertension: Secondary | ICD-10-CM | POA: Diagnosis not present

## 2017-08-28 ENCOUNTER — Other Ambulatory Visit: Payer: Self-pay

## 2017-08-28 ENCOUNTER — Encounter: Payer: Self-pay | Admitting: Internal Medicine

## 2017-08-28 ENCOUNTER — Ambulatory Visit (INDEPENDENT_AMBULATORY_CARE_PROVIDER_SITE_OTHER): Payer: 59 | Admitting: Internal Medicine

## 2017-08-28 VITALS — BP 134/74 | HR 68 | Temp 98.0°F | Ht 66.0 in | Wt 291.1 lb

## 2017-08-28 DIAGNOSIS — B9689 Other specified bacterial agents as the cause of diseases classified elsewhere: Secondary | ICD-10-CM | POA: Insufficient documentation

## 2017-08-28 DIAGNOSIS — K148 Other diseases of tongue: Secondary | ICD-10-CM

## 2017-08-28 DIAGNOSIS — Q382 Macroglossia: Secondary | ICD-10-CM | POA: Insufficient documentation

## 2017-08-28 DIAGNOSIS — J0100 Acute maxillary sinusitis, unspecified: Secondary | ICD-10-CM | POA: Diagnosis not present

## 2017-08-28 DIAGNOSIS — R0981 Nasal congestion: Secondary | ICD-10-CM

## 2017-08-28 DIAGNOSIS — J329 Chronic sinusitis, unspecified: Principal | ICD-10-CM

## 2017-08-28 MED ORDER — AMOXICILLIN-POT CLAVULANATE 875-125 MG PO TABS: 1.0000 | ORAL_TABLET | Freq: Two times a day (BID) | ORAL | 0 refills | Status: AC

## 2017-08-28 NOTE — Patient Instructions (Signed)
Thank you for allowing us to provide your care. Please let us know if the antibiotics do not help you. We are doing some lab today and I will call you with the results when they are available.

## 2017-08-28 NOTE — Progress Notes (Signed)
   CC: Cough and sinus congestion   HPI:  Stacey Price is a 42 y.o. who presented to the clinic with three months of cough and sinus congestion. She states that she was evaluated three weeks prior at the onset of her symptoms. At that time she had a sore throat, rhinorrhea, and excessive sneezing. She felt like things were going to get better; however, things have only progressed. She continues to have a sore throat along with hoarseness, cough, and sinus pressure/pain worse on the left. She is tried multiple over-the-counter cough suppressants and decongestants without relief. She is also tried Affrin nasal spray without relief. 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 She denies fevers, chills, nausea/vomiting, abdominal pain, new rash.  Past Medical History:  Diagnosis Date  . Asthma   . Hypertension   . Obesity    Review of Systems:  Denies fevers, chills Denies nausea/vomiting, abdominal pain  Physical Exam: Vitals:   08/28/17 1321  BP: 134/74  Pulse: 68  Temp: 98 F (36.7 C)  TempSrc: Oral  SpO2: 100%  Weight: 291 lb 1.6 oz (132 kg)  Height: 5\' 6"  (1.676 m)   General: Obese female in no acute distress HENT: Left nare is erythematous with white mucus compared to the right. Oropharynx is clear without exudates. No LAD. 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  Pulm: Good air movement with no wheezing or crackles  CV: RRR, no murmurs, no rubs   Media Information   Document Information   Photos  Tongue  08/28/2017 13:46  Attached To:  Lucious GrovesAngela Petralia  Source Information   Gwynn BurlyWallace, Andrew, DO  [facility]   Assessment & Plan:   See Encounters Tab for problem based charting.  Patient discussed with Dr. Rogelia BogaButcher

## 2017-08-28 NOTE — Assessment & Plan Note (Signed)
On physical exam the patient was noted to have macroglossia. Images are in the chart. She recently had her thyroid checked which was within normal limits. She denies symptoms consistent with neuropathy. Today we will check a B12 along with an SPEP and UPEP to evaluate her for Amyloidosis.   Plan: - Checking B12, SPEP, and UPEP. Evaluating for Amyloidosis

## 2017-08-28 NOTE — Assessment & Plan Note (Addendum)
Patient presenting with signs and symptoms consistent with bacterial sinusitis including point tenderness over the left maxillary sinus with purulent discharge. She denies any adverse reactions to antibiotics in the past. Will start her on Augmentin 875/125 BID for five days. She will follow up if symptoms do not resolved.  Plan: - START Augmentin 875/125 BID for 5 days

## 2017-08-28 NOTE — Progress Notes (Signed)
Internal Medicine Clinic Attending  Case discussed with Dr. Helberg at the time of the visit.  We reviewed the resident's history and exam and pertinent patient test results.  I agree with the assessment, diagnosis, and plan of care documented in the resident's note.    

## 2017-08-31 LAB — VITAMIN B12: Vitamin B-12: 596 pg/mL (ref 232–1245)

## 2017-08-31 LAB — PROTEIN ELECTROPHORESIS, SERUM
A/G Ratio: 1.1 (ref 0.7–1.7)
ALPHA 1: 0.2 g/dL (ref 0.0–0.4)
ALPHA 2: 0.5 g/dL (ref 0.4–1.0)
Albumin ELP: 3.7 g/dL (ref 2.9–4.4)
Beta: 1.1 g/dL (ref 0.7–1.3)
GAMMA GLOBULIN: 1.6 g/dL (ref 0.4–1.8)
GLOBULIN, TOTAL: 3.4 g/dL (ref 2.2–3.9)
Total Protein: 7.1 g/dL (ref 6.0–8.5)

## 2017-08-31 LAB — PROTEIN ELECTROPHORESIS, URINE REFLEX
ALBUMIN ELP UR: 36.6 %
ALPHA-2-GLOBULIN, U: 13.1 %
Alpha-1-Globulin, U: 3.9 %
Beta Globulin, U: 27.8 %
Gamma Globulin, U: 18.6 %
Protein, Ur: 25.6 mg/dL

## 2017-09-04 DIAGNOSIS — R002 Palpitations: Secondary | ICD-10-CM | POA: Diagnosis not present

## 2017-09-07 ENCOUNTER — Other Ambulatory Visit (HOSPITAL_COMMUNITY): Payer: 59

## 2017-09-08 ENCOUNTER — Telehealth: Payer: Self-pay

## 2017-09-08 NOTE — Telephone Encounter (Signed)
Requesting lab results. Please call pt back.  

## 2017-09-09 ENCOUNTER — Encounter: Payer: Self-pay | Admitting: Internal Medicine

## 2017-09-09 NOTE — Telephone Encounter (Signed)
Pt is calling back requesting lab results. Please call pt back.

## 2017-09-09 NOTE — Telephone Encounter (Signed)
Have attempted to call the patient x 2. Each time goes straight to voicemail. Will send out letter that labs were normal.

## 2017-09-10 ENCOUNTER — Telehealth: Payer: Self-pay | Admitting: *Deleted

## 2017-09-10 ENCOUNTER — Telehealth: Payer: Self-pay | Admitting: Internal Medicine

## 2017-09-10 ENCOUNTER — Other Ambulatory Visit (INDEPENDENT_AMBULATORY_CARE_PROVIDER_SITE_OTHER): Payer: 59

## 2017-09-10 ENCOUNTER — Other Ambulatory Visit: Payer: Self-pay | Admitting: *Deleted

## 2017-09-10 ENCOUNTER — Encounter: Payer: Self-pay | Admitting: Internal Medicine

## 2017-09-10 DIAGNOSIS — Q382 Macroglossia: Secondary | ICD-10-CM

## 2017-09-10 NOTE — Telephone Encounter (Signed)
After further review the SPEP and UPEP are not sensitive or specific enough to rule out Amyloidosis in the patient. I have called to discuss this with the patient and have ordered free light chains and serum/urine immunofixation. Patient will present to labs today.

## 2017-09-10 NOTE — Telephone Encounter (Signed)
Call from pt - requesting test results. Letter in chart per Dr Caron PresumeHelberg - "I am writing to let you know that your recent lab work was normal." - relayed to pt.

## 2017-09-11 LAB — IMMUNOFIXATION, URINE

## 2017-09-14 LAB — KAPPA/LAMBDA LIGHT CHAINS
IG KAPPA FREE LIGHT CHAIN: 25.1 mg/L — AB (ref 3.3–19.4)
IG LAMBDA FREE LIGHT CHAIN: 15.6 mg/L (ref 5.7–26.3)
Kappa/Lambda FluidC Ratio: 1.61 (ref 0.26–1.65)

## 2017-09-14 LAB — IMMUNOFIXATION, SERUM
IgA/Immunoglobulin A, Serum: 227 mg/dL (ref 87–352)
IgG (Immunoglobin G), Serum: 1664 mg/dL — ABNORMAL HIGH (ref 700–1600)
IgM (Immunoglobulin M), Srm: 100 mg/dL (ref 26–217)

## 2017-09-16 DIAGNOSIS — R002 Palpitations: Secondary | ICD-10-CM | POA: Diagnosis not present

## 2017-09-17 ENCOUNTER — Telehealth: Payer: Self-pay

## 2017-09-17 NOTE — Telephone Encounter (Signed)
The tests ordered were within normal limits.

## 2017-09-17 NOTE — Telephone Encounter (Signed)
Pt stated she saw Dr Caron PresumeHelberg; tests were ordered; requesting results.Dr Caron PresumeHelberg is on vacation - will send to the Attending.

## 2017-09-17 NOTE — Telephone Encounter (Signed)
Pt called / informed tests are within nl limits per Dr Heide SparkNarendra. Stated thank you for calling her back.

## 2017-09-17 NOTE — Telephone Encounter (Signed)
Requesting lab results. Please call pt back.  

## 2017-10-07 DIAGNOSIS — I10 Essential (primary) hypertension: Secondary | ICD-10-CM | POA: Diagnosis not present

## 2017-10-07 DIAGNOSIS — R002 Palpitations: Secondary | ICD-10-CM | POA: Diagnosis not present

## 2017-11-18 DIAGNOSIS — R002 Palpitations: Secondary | ICD-10-CM | POA: Diagnosis not present

## 2017-11-18 DIAGNOSIS — I10 Essential (primary) hypertension: Secondary | ICD-10-CM | POA: Diagnosis not present

## 2017-11-30 ENCOUNTER — Other Ambulatory Visit: Payer: Self-pay | Admitting: Internal Medicine

## 2017-11-30 DIAGNOSIS — Z1231 Encounter for screening mammogram for malignant neoplasm of breast: Secondary | ICD-10-CM

## 2017-12-11 ENCOUNTER — Ambulatory Visit (HOSPITAL_COMMUNITY)
Admission: EM | Admit: 2017-12-11 | Discharge: 2017-12-11 | Disposition: A | Discharge status: Home / Self Care | Payer: 59 | Attending: Family Medicine | Admitting: Family Medicine

## 2017-12-11 ENCOUNTER — Encounter (HOSPITAL_COMMUNITY): Payer: Self-pay

## 2017-12-11 ENCOUNTER — Emergency Department (HOSPITAL_COMMUNITY)
Admission: EM | Admit: 2017-12-11 | Discharge: 2017-12-11 | Disposition: A | Discharge status: Home / Self Care | Payer: 59 | Attending: Emergency Medicine | Admitting: Emergency Medicine

## 2017-12-11 ENCOUNTER — Emergency Department (HOSPITAL_COMMUNITY): Payer: 59

## 2017-12-11 ENCOUNTER — Other Ambulatory Visit: Payer: Self-pay

## 2017-12-11 DIAGNOSIS — Z79899 Other long term (current) drug therapy: Secondary | ICD-10-CM | POA: Insufficient documentation

## 2017-12-11 DIAGNOSIS — R100 Acute abdomen: Secondary | ICD-10-CM

## 2017-12-11 DIAGNOSIS — Z87891 Personal history of nicotine dependence: Secondary | ICD-10-CM | POA: Diagnosis not present

## 2017-12-11 DIAGNOSIS — R1030 Lower abdominal pain, unspecified: Secondary | ICD-10-CM | POA: Diagnosis present

## 2017-12-11 DIAGNOSIS — R109 Unspecified abdominal pain: Secondary | ICD-10-CM

## 2017-12-11 DIAGNOSIS — R110 Nausea: Secondary | ICD-10-CM

## 2017-12-11 DIAGNOSIS — R1084 Generalized abdominal pain: Secondary | ICD-10-CM | POA: Insufficient documentation

## 2017-12-11 DIAGNOSIS — Z8611 Personal history of tuberculosis: Secondary | ICD-10-CM

## 2017-12-11 DIAGNOSIS — I10 Essential (primary) hypertension: Secondary | ICD-10-CM | POA: Diagnosis not present

## 2017-12-11 DIAGNOSIS — Z8542 Personal history of malignant neoplasm of other parts of uterus: Secondary | ICD-10-CM | POA: Insufficient documentation

## 2017-12-11 HISTORY — DX: Peritonitis, unspecified: K65.9

## 2017-12-11 LAB — COMPREHENSIVE METABOLIC PANEL
ALK PHOS: 51 U/L (ref 38–126)
ALT: 20 U/L (ref 14–54)
ANION GAP: 6 (ref 5–15)
AST: 27 U/L (ref 15–41)
Albumin: 3.7 g/dL (ref 3.5–5.0)
BILIRUBIN TOTAL: 1.2 mg/dL (ref 0.3–1.2)
BUN: 7 mg/dL (ref 6–20)
CALCIUM: 8.7 mg/dL — AB (ref 8.9–10.3)
CO2: 23 mmol/L (ref 22–32)
Chloride: 109 mmol/L (ref 101–111)
Creatinine, Ser: 0.86 mg/dL (ref 0.44–1.00)
GFR calc Af Amer: >60 mL/min (ref >60)
Glucose, Bld: 94 mg/dL (ref 65–99)
Potassium: 3.9 mmol/L (ref 3.5–5.1)
Sodium: 138 mmol/L (ref 135–145)
TOTAL PROTEIN: 7.2 g/dL (ref 6.5–8.1)

## 2017-12-11 LAB — CBC
HCT: 38.8 % (ref 36.0–46.0)
Hemoglobin: 12.9 g/dL (ref 12.0–15.0)
MCH: 30.1 pg (ref 26.0–34.0)
MCHC: 33.2 g/dL (ref 30.0–36.0)
MCV: 90.7 fL (ref 78.0–100.0)
Platelets: 205 10*3/uL (ref 150–400)
RBC: 4.28 MIL/uL (ref 3.87–5.11)
RDW: 13.7 % (ref 11.5–15.5)
WBC: 6.5 10*3/uL (ref 4.0–10.5)

## 2017-12-11 LAB — URINALYSIS, ROUTINE W REFLEX MICROSCOPIC
BILIRUBIN URINE: NEGATIVE
Glucose, UA: NEGATIVE mg/dL
Hgb urine dipstick: NEGATIVE
KETONES UR: NEGATIVE mg/dL
LEUKOCYTES UA: NEGATIVE
NITRITE: NEGATIVE
PROTEIN: NEGATIVE mg/dL
Specific Gravity, Urine: 1.019 (ref 1.005–1.030)
pH: 7 (ref 5.0–8.0)

## 2017-12-11 LAB — LIPASE, BLOOD: Lipase: 23 U/L (ref 11–51)

## 2017-12-11 LAB — I-STAT BETA HCG BLOOD, ED (MC, WL, AP ONLY)

## 2017-12-11 MED ORDER — IOHEXOL 300 MG/ML  SOLN
100.0000 mL | Freq: Once | INTRAMUSCULAR | Status: AC | PRN
  Administered 2017-12-11: 100 mL via INTRAVENOUS

## 2017-12-11 MED ORDER — MORPHINE SULFATE (PF) 4 MG/ML IV SOLN
4.0000 mg | INTRAVENOUS | Status: DC | PRN
  Administered 2017-12-11: 4 mg via INTRAVENOUS
  Filled 2017-12-11: qty 1

## 2017-12-11 MED ORDER — DICYCLOMINE HCL 20 MG PO TABS: 20.0000 mg | ORAL_TABLET | Freq: Two times a day (BID) | ORAL | 0 refills | Status: DC | PRN

## 2017-12-11 MED ORDER — ONDANSETRON HCL 4 MG/2ML IJ SOLN
4.0000 mg | Freq: Once | INTRAMUSCULAR | Status: AC
  Administered 2017-12-11: 4 mg via INTRAVENOUS
  Filled 2017-12-11: qty 2

## 2017-12-11 NOTE — ED Triage Notes (Signed)
Pt endorses mid abd pain x 3 days with nausea and frequent urination, no vomiting and no diarrhea. [facility] Hx of peritonitis.

## 2017-12-11 NOTE — Discharge Instructions (Addendum)
No specific cause for your abdominal pain was identified at your testing in the emergency room today Bentyl was given as a trial to take when you have pain.  This is an intestinal antispasmodic Please recheck if you develop bloody stools, fever, worsening or changing symptoms.

## 2017-12-11 NOTE — ED Provider Notes (Signed)
[facility]    CSN: [id number] Arrival date & time: 12/11/17  1144     History   Chief Complaint Chief Complaint  Patient presents with  . Abdominal Pain    HPI Stacey Price is a 42 y.o. female.   HPI  Patient is here for abdominal pain.  Is gradually worsening over 3 days.  Nausea but no vomiting.  No fever or chills.  No diarrhea.  Last bowel movement 2 days ago was normal.  No urinary frequency or dysuria.  No vaginal discharge.  Patient is status post hysterectomy for uterine cancer in 2010.  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  Since treatment for that she has been well.  No recent travel.  No food allergies or intolerance.  No sick exposure or gastroenteritis.  Past Medical History:  Diagnosis Date  . Asthma   . Hypertension   . Obesity     Patient Active Problem List   Diagnosis Date Noted  . Bacterial sinusitis 08/28/2017  . 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 Snoring 07/18/2017  . Palpitation 07/16/2017  . Hoarseness 05/13/2017  . Migraine headache 04/02/2017  . Tension headache 02/09/2017  . Epistaxis 02/09/2017  . Obesity 01/20/2017  . Rash and nonspecific skin eruption 12/28/2016  . Foul smelling vaginal discharge 12/28/2016  . Allergic rhinitis 10/15/2016  . Breast skin changes 04/15/2016  . HTN (hypertension) 10/12/2015  . Asthma 10/12/2015  . Hives 10/12/2015  . Breast pain 10/12/2015  . Tinea versicolor 10/12/2015    Past Surgical History:  Procedure Laterality Date  . ABDOMINAL HYSTERECTOMY  2009   [facility]   . CESAREAN SECTION     1995-2000    OB History   None      Home Medications    Prior to Admission medications   Medication Sig Start Date End Date Taking? Authorizing Provider  albuterol (PROVENTIL HFA;VENTOLIN HFA) 108 (90 Base) MCG/ACT inhaler Inhale into the lungs every 6 (six) hours as needed for wheezing or shortness  of breath.    [provider]  amLODipine (NORVASC) 5 MG tablet Take 1 tablet (5 mg total) by mouth daily. 12/26/16   Eulah Pont, MD  diclofenac sodium (VOLTAREN) 1 % GEL Apply 2 g topically 4 (four) times daily. 01/12/17   Eulah Pont, MD  FLUoxetine (PROZAC) 20 MG tablet Take 20 mg by mouth daily.    [provider]  Fluticasone-Salmeterol (ADVAIR) 250-50 MCG/DOSE AEPB Inhale 1 puff into the lungs 2 (two) times daily.    [provider]  ketoconazole (NIZORAL) 2 % cream Apply 1 application topically daily. To Ankle 07/22/17   Inez Catalina, MD  loratadine (CLARITIN) 10 MG tablet Take 1 tablet (10 mg total) by mouth daily as needed for allergies. 02/11/17 02/11/18  Inez Catalina, MD  meloxicam (MOBIC) 7.5 MG tablet Take 1 tablet (7.5 mg total) by mouth 2 (two) times daily as needed for pain. 02/11/17   Inez Catalina, MD  mupirocin ointment (BACTROBAN) 2 % Place 1 application into the nose 2 (two) times daily. 12/26/16   Eulah Pont, MD  omeprazole (PRILOSEC) 20 MG capsule Take 20 mg by mouth daily.    [provider]  SUMAtriptan (IMITREX) 50 MG tablet Take 1 tablet (50 mg total) by mouth once as needed for migraine. May repeat in 2 hours if headache persists or recurs. 07/22/17 07/23/18  Inez Catalina,  MD  topiramate (TOPAMAX) 50 MG tablet Take 1 tablet (50 mg total) by mouth daily for 7 days, THEN 1 tablet (50 mg total) 2 (two) times daily for 21 days. 07/22/17 08/19/17  Inez Catalina, MD  traZODone (DESYREL) 50 MG tablet Take 50 mg by mouth at bedtime.    [provider]    Family History Family History  Problem Relation Age of Onset  . Hypertension Mother   . Hypertension Father   . Breast cancer Cousin     Social History Social History   Tobacco Use  . Smoking status: Former Smoker    Last attempt to quit: 11/23/2014    Years since quitting: 3.0  . Smokeless tobacco: Never Used  Substance Use Topics  . Alcohol use: No    Frequency: Never    . Drug use: No     Allergies   Patient has no known allergies.   Review of Systems Review of Systems  Constitutional: Negative for chills and fever.  HENT: Negative for ear pain and sore throat.   Eyes: Negative for pain and visual disturbance.  Respiratory: Negative for cough and shortness of breath.   Cardiovascular: Negative for chest pain and palpitations.  Gastrointestinal: Positive for abdominal pain and nausea. Negative for constipation, diarrhea and vomiting.  Genitourinary: Negative for difficulty urinating, dysuria, flank pain, frequency, hematuria and vaginal discharge.  Musculoskeletal: Negative for arthralgias and back pain.  Skin: Negative for color change and rash.  Neurological: Negative for seizures and syncope.  All other systems reviewed and are negative.    Physical Exam Triage Vital Signs ED Triage Vitals  Enc Vitals Group     BP 12/11/17 1208 136/75     Pulse Rate 12/11/17 1208 87     Resp 12/11/17 1208 20     Temp 12/11/17 1208 98.3 F (36.8 C)     Temp Source 12/11/17 1208 Oral     SpO2 12/11/17 1208 100 %     Weight --      Height --      Head Circumference --      Peak Flow --      Pain Score 12/11/17 1253 8     Pain Loc --      Pain Edu? --      Excl. in GC? --    No data found.  Updated Vital Signs BP 136/75 (BP Location: Left Arm)   Pulse 87   Temp 98.3 F (36.8 C) (Oral)   Resp 20   SpO2 100%       Physical Exam  Constitutional: She is oriented to person, place, and time. She appears well-developed and well-nourished. She appears distressed.  Appears acutely uncomfortable.  Holding onto abdomen.  Moves from chair to table with difficulty.  HENT:  Head: Normocephalic and atraumatic.  Mouth/Throat: Oropharynx is clear and moist.  Eyes: Pupils are equal, round, and reactive to light. Conjunctivae are normal.  Neck: Normal range of motion.  Cardiovascular: Normal rate, regular rhythm and normal heart sounds.   Pulmonary/Chest: Effort normal and breath sounds normal. No respiratory distress.  Abdominal: Soft. Bowel sounds are normal. She exhibits no distension. There is no hepatosplenomegaly. There is generalized tenderness. There is rebound and guarding. There is no rigidity.  Musculoskeletal: Normal range of motion. She exhibits no edema.  Neurological: She is alert and oriented to person, place, and time.  Skin: Skin is warm and dry.  Psychiatric: She has a normal mood and affect.  UC Treatments / Results  Labs (all labs ordered are listed, but only abnormal results are displayed) Labs Reviewed - No data to display  EKG None  Radiology No results found.  Procedures Procedures (including critical care time)  Medications Ordered in UC Medications - No data to display  Initial Impression / Assessment and Plan / UC Course  I have reviewed the triage vital signs and the nursing notes.  Pertinent labs & imaging results that were available during my care of the patient were reviewed by me and considered in my medical decision making (see chart for details).     Explained to the patient limitations of abdominal pain work-up in the urgent care center.  I believe she needs more lab work and imaging then we can provide for her.  She is advised to go to the emergency room for treatment.   Final Clinical Impressions(s) / UC Diagnoses   Final diagnoses:  Acute abdomen     Discharge Instructions     GO TO ER  3 ED Prescriptions    None     Controlled Substance Prescriptions [place] Controlled Substance Registry consulted? Not Applicable   Eustace MooreNelson, Yvonne Sue, MD 12/11/17 1314

## 2017-12-11 NOTE — Discharge Instructions (Signed)
GO TO ER °

## 2017-12-11 NOTE — ED Provider Notes (Signed)
[facility] EMERGENCY DEPARTMENT Provider Note   CSN: [id number] Arrival date & time: 12/11/17  1317     History   Chief Complaint Chief Complaint  Patient presents with  . Abdominal Pain    HPI Stacey Price is a 42 y.o. female.  Chief complaint is abdominal pain  HPI 42 year old female.  Describes 3 days of lower abdominal pain.  Suprapubic.  No urinary symptoms.  Normal bowel habits without change.  No diarrhea or constipation.  No fevers or chills.  No bloody stools.  Seen and evaluated at urgent care and referred here.  History of tuberculosis and tuberculosis peritonitis status post 1 year of triple TB therapy over 3 years ago.  History of hysterectomy for uterine cancer.  Has never had colonoscopy.  Is never had diverticulitis to her knowledge.  Past Medical History:  Diagnosis Date  . Asthma   . Hypertension   . Obesity   . Peritonitis [facility]) 2014    Patient Active Problem List   Diagnosis Date Noted  . History of uterine cancer 12/11/2017  . History of treatment for tuberculosis 12/11/2017  . Bacterial sinusitis 08/28/2017  . 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 Snoring 07/18/2017  . Palpitation 07/16/2017  . Migraine headache 04/02/2017  . Tension headache 02/09/2017  . Epistaxis 02/09/2017  . Obesity 01/20/2017  . Rash and nonspecific skin eruption 12/28/2016  . Allergic rhinitis 10/15/2016  . Breast skin changes 04/15/2016  . HTN (hypertension) 10/12/2015  . Asthma 10/12/2015  . Hives 10/12/2015  . Breast pain 10/12/2015  . Tinea versicolor 10/12/2015    Past Surgical History:  Procedure Laterality Date  . ABDOMINAL HYSTERECTOMY  2009   [facility]   . CESAREAN SECTION     1995-2000     OB History   None      Home Medications    Prior to Admission medications   Medication Sig Start Date End Date Taking? Authorizing Provider  albuterol (PROVENTIL HFA;VENTOLIN HFA) 108 (90 Base) MCG/ACT inhaler Inhale into the lungs every 6 (six)  hours as needed for wheezing or shortness of breath.   Yes [provider]  amLODipine (NORVASC) 5 MG tablet Take 1 tablet (5 mg total) by mouth daily. 12/26/16  Yes Eulah Pont, MD  citalopram (CELEXA) 10 MG tablet Take 10 mg by mouth daily. 10/01/17  Yes [provider]  diclofenac sodium (VOLTAREN) 1 % GEL Apply 2 g topically 4 (four) times daily. 01/12/17  Yes Eulah Pont, MD  Fluticasone-Salmeterol (ADVAIR) 250-50 MCG/DOSE AEPB Inhale 1 puff into the lungs 2 (two) times daily.   Yes [provider]  ketoconazole (NIZORAL) 2 % cream Apply 1 application topically daily. To Ankle 07/22/17  Yes Inez Catalina, MD  loratadine (CLARITIN) 10 MG tablet Take 1 tablet (10 mg total) by mouth daily as needed for allergies. 02/11/17 02/11/18 Yes Inez Catalina, MD  meloxicam (MOBIC) 7.5 MG tablet Take 1 tablet (7.5 mg total) by mouth 2 (two) times daily as needed for pain. 02/11/17  Yes Inez Catalina, MD  omeprazole (PRILOSEC) 20 MG capsule Take 20 mg by mouth daily.   Yes [provider]  propranolol ER (INDERAL LA) 80 MG 24 hr capsule Take 80 mg by mouth daily. 10/07/17  Yes [provider]  SUMAtriptan (IMITREX) 50 MG tablet Take 1 tablet (50 mg total) by mouth once as needed for migraine. May repeat in 2 hours if headache persists or recurs. 07/22/17 07/23/18 Yes Inez Catalina, MD  topiramate (TOPAMAX) 50 MG tablet Take 1 tablet (50 mg total) by mouth daily for 7 days, THEN 1 tablet (50 mg total) 2 (two) times daily for 21 days. Patient taking differently: take 1 tablet (50 mg total) 2 (two) times daily 07/22/17 12/11/17 Yes Inez Catalina, MD  traZODone (DESYREL) 50 MG tablet Take 50 mg by mouth at bedtime.   Yes [provider]  dicyclomine (BENTYL) 20 MG tablet Take 1 tablet (20 mg total) by mouth 3 times/day as needed-between meals & bedtime (Abdominal pain). 12/11/17   Rolland Porter, MD  mupirocin ointment (BACTROBAN) 2 % Place 1 application into the nose 2  (two) times daily. Patient not taking: Reported on 12/11/2017 12/26/16   Eulah Pont, MD    Family History Family History  Problem Relation Age of Onset  . Hypertension Mother   . Hypertension Father   . Breast cancer Cousin     Social History Social History   Tobacco Use  . Smoking status: Former Smoker    Last attempt to quit: 11/23/2014    Years since quitting: 3.0  . Smokeless tobacco: Never Used  Substance Use Topics  . Alcohol use: No    Frequency: Never  . Drug use: No     Allergies   Patient has no known allergies.   Review of Systems Review of Systems  Constitutional: Negative for appetite change, chills, diaphoresis, fatigue and fever.  HENT: Negative for mouth sores, sore throat and trouble swallowing.   Eyes: Negative for visual disturbance.  Respiratory: Negative for cough, chest tightness, shortness of breath and wheezing.   Cardiovascular: Negative for chest pain.  Gastrointestinal: Positive for abdominal pain. Negative for abdominal distention, diarrhea, nausea and vomiting.  Endocrine: Negative for polydipsia, polyphagia and polyuria.  Genitourinary: Negative for dysuria, frequency and hematuria.  Musculoskeletal: Negative for gait problem.  Skin: Negative for color change, pallor and rash.  Neurological: Negative for dizziness, syncope, light-headedness and headaches.  Hematological: Does not bruise/bleed easily.  Psychiatric/Behavioral: Negative for behavioral problems and confusion.     Physical Exam Updated Vital Signs BP (!) 147/97 (BP Location: Left Wrist)   Pulse (!) 54   Temp 98.3 F (36.8 C) (Oral)   Resp 14   Ht 5\' 6"  (1.676 m)   Wt 109.3 kg (241 lb)   SpO2 100%   BMI 38.90 kg/m   Physical Exam  Constitutional: She is oriented to person, place, and time. She appears well-developed and well-nourished. No distress.  Smiling.  No distress.  HENT:  Head: Normocephalic.  Eyes: Pupils are equal, round, and reactive to light.  Conjunctivae are normal. No scleral icterus.  Neck: Normal range of motion. Neck supple. No thyromegaly present.  Cardiovascular: Normal rate and regular rhythm. Exam reveals no gallop and no friction rub.  No murmur heard. Pulmonary/Chest: Effort normal and breath sounds normal. No respiratory distress. She has no wheezes. She has no rales.  Abdominal: Soft. Bowel sounds are normal. She exhibits no distension. There is no tenderness. There is no rebound.  Abdomen soft.  She has some tenderness in the suprapubic abdomen and in the periumbilical abdomen.  No guarding, rebound, or peritoneal irritation.  No pain with cough.  Musculoskeletal: Normal range of motion.  Neurological: She is alert and oriented to person, place, and time.  Skin: Skin is warm and dry. No rash noted.  Psychiatric: She has a normal mood and affect. Her behavior is normal.     ED Treatments / Results  Labs (  all labs ordered are listed, but only abnormal results are displayed) Labs Reviewed  COMPREHENSIVE METABOLIC PANEL - Abnormal; Notable for the following components:      Result Value   Calcium 8.7 (*)    All other components within normal limits  LIPASE, BLOOD  CBC  URINALYSIS, ROUTINE W REFLEX MICROSCOPIC  I-STAT BETA HCG BLOOD, ED ([facility], [facility], [facility] ONLY)    EKG None  Radiology Ct Abdomen Pelvis W Contrast  Result Date: 12/11/2017 CLINICAL DATA:  hx of uterus ca with hysterectomy 2010, complaints of abdominal pain that is centralized. Pt reports feeling like this before and being diagnosed with peritonitis EXAM: CT ABDOMEN AND PELVIS WITH CONTRAST TECHNIQUE: Multidetector CT imaging of the abdomen and pelvis was performed using the standard protocol following bolus administration of intravenous contrast. CONTRAST:  100mL OMNIPAQUE IOHEXOL 300 MG/ML  SOLN COMPARISON:  None. FINDINGS: Lower chest: No acute abnormality. Hepatobiliary: 5 mm low-density lesion at the dome of the right lobe, consistent with a cyst. No  other liver masses or lesions. Liver normal in size. Normal gallbladder. No bile duct dilation. Pancreas: Unremarkable. No pancreatic ductal dilatation or surrounding inflammatory changes. Spleen: Normal in size. Several calcifications consistent with healed granuloma. No masses. Adrenals/Urinary Tract: Adrenal glands are unremarkable. Kidneys are normal, without renal calculi, focal lesion, or hydronephrosis. Bladder is unremarkable. Stomach/Bowel: Stomach is within normal limits. Appendix appears normal. No evidence of bowel wall thickening, distention, or inflammatory changes. No significant increase in colonic stool. Vascular/Lymphatic: No significant vascular findings are present. No enlarged abdominal or pelvic lymph nodes. Reproductive: Uterus surgically absent. Normal sized residual ovaries. No adnexal masses. Other: No abdominal wall hernia or abnormality. No abdominopelvic ascites. Musculoskeletal: No fracture or acute finding. No osteoblastic or osteolytic lesions. IMPRESSION: 1. No acute findings. No findings to account for the patient's symptoms. Electronically Signed   By: Amie Portlandavid  Ormond M.D.   On: 12/11/2017 19:01    Procedures Procedures (including critical care time)  Medications Ordered in ED Medications  morphine 4 MG/ML injection 4 mg (4 mg Intravenous Given 12/11/17 1817)  ondansetron (ZOFRAN) injection 4 mg (4 mg Intravenous Given 12/11/17 1817)  iohexol (OMNIPAQUE) 300 MG/ML solution 100 mL (100 mLs Intravenous Contrast Given 12/11/17 1832)     Initial Impression / Assessment and Plan / ED Course  I have reviewed the triage vital signs and the nursing notes.  Pertinent labs & imaging results that were available during my care of the patient were reviewed by me and considered in my medical decision making (see chart for details).    Prepubic pain.  Normal urine.  Her exam sensitivity is limited by her size.  I have an planning CT scan to rule out occult diverticular  infection.  7:22 PM: CT scan reassuring.  Had been given 1 dose of pain medication her exam is benign.  She has an appetite.  Plan is discharge home.  Would recommended that she limit her diet to liquids and easy digestible support with brat diet.  Bentyl for pain.  Recheck fever, worsening or changing pain.  Bloody stools, any other changes.  Otherwise expectant management.  Final Clinical Impressions(s) / ED Diagnoses   Final diagnoses:  Generalized abdominal pain    ED Discharge Orders        Ordered    dicyclomine (BENTYL) 20 MG tablet  2 times daily between meals and at bedtime PRN     12/11/17 1918       Rolland PorterJames, Zaven Klemens, MD 12/11/17 Ernestina [id number]

## 2017-12-11 NOTE — ED Triage Notes (Signed)
Pt presents with complaints of abdominal pain that is centralized. Pt reports feeling like this before and being diagnosed with peritonitis.

## 2017-12-11 NOTE — ED Notes (Signed)
Patient verbalizes understanding of discharge instructions. Opportunity for questioning and answers were provided. Armband removed by staff, pt discharged from ED.  

## 2017-12-15 DIAGNOSIS — R109 Unspecified abdominal pain: Secondary | ICD-10-CM | POA: Diagnosis not present

## 2017-12-15 DIAGNOSIS — R3915 Urgency of urination: Secondary | ICD-10-CM | POA: Diagnosis not present

## 2017-12-25 NOTE — Addendum Note (Signed)
Addended by: Remus BlakeBARROW, Tesia Lybrand K on: 12/25/2017 09:59 AM   Modules accepted: Orders

## 2018-01-06 ENCOUNTER — Ambulatory Visit: Payer: 59

## 2018-01-06 ENCOUNTER — Ambulatory Visit
Admission: RE | Admit: 2018-01-06 | Discharge: 2018-01-06 | Disposition: A | Discharge status: Home / Self Care | Payer: 59 | Source: Ambulatory Visit | Attending: Internal Medicine | Admitting: Internal Medicine

## 2018-01-06 DIAGNOSIS — Z1231 Encounter for screening mammogram for malignant neoplasm of breast: Secondary | ICD-10-CM | POA: Diagnosis not present

## 2018-01-25 ENCOUNTER — Other Ambulatory Visit: Payer: Self-pay

## 2018-01-25 ENCOUNTER — Ambulatory Visit (INDEPENDENT_AMBULATORY_CARE_PROVIDER_SITE_OTHER): Payer: 59 | Admitting: Internal Medicine

## 2018-01-25 ENCOUNTER — Encounter (INDEPENDENT_AMBULATORY_CARE_PROVIDER_SITE_OTHER): Payer: Self-pay

## 2018-01-25 VITALS — BP 142/79 | HR 77 | Temp 98.8°F | Ht 66.0 in | Wt 288.9 lb

## 2018-01-25 DIAGNOSIS — Z79899 Other long term (current) drug therapy: Secondary | ICD-10-CM

## 2018-01-25 DIAGNOSIS — G4730 Sleep apnea, unspecified: Secondary | ICD-10-CM

## 2018-01-25 DIAGNOSIS — R0683 Snoring: Secondary | ICD-10-CM

## 2018-01-25 DIAGNOSIS — R400 Somnolence: Secondary | ICD-10-CM

## 2018-01-25 DIAGNOSIS — G43909 Migraine, unspecified, not intractable, without status migrainosus: Secondary | ICD-10-CM

## 2018-01-25 DIAGNOSIS — R110 Nausea: Secondary | ICD-10-CM

## 2018-01-25 DIAGNOSIS — G43919 Migraine, unspecified, intractable, without status migrainosus: Secondary | ICD-10-CM

## 2018-01-25 MED ORDER — KETOROLAC TROMETHAMINE 30 MG/ML IJ SOLN
30.0000 mg | Freq: Once | INTRAMUSCULAR | Status: AC
  Administered 2018-01-25: 30 mg via INTRAMUSCULAR

## 2018-01-25 NOTE — Progress Notes (Signed)
   CC: Headache  HPI:  [name] is a 42 y.o. female with PMH listed below who presents to clinic for evaluation of headache.   Headache: Patient presents a 1-week history of constant, throbbing headache that she localizes to the occipital region and is associated with nausea, photophobia and blurry vision. She states rest usually improves HA, but this time it has been constant for 1 week. It is worse in the morning and with bending forward. She did not tried her home sumatriptan. Denies fever, chills, and recent trauma. She has a history of migraine headaches that have a similar presentation and is currently on propanolol, topiramite, and sumatriptan PRN. Failed amitriptyline, and OTC remedies (NSAIDs, acetaminophen, and excedrin) do not provide relief. She also presents today with signs/symptoms consistent with OSA for which she was recommended to be evaluated 7 months ago but did not get a sleep study (see below). I suspect her migraine headaches are likely exacerbated by untreated OSA.  - Toradol 30 mg x1 in clinic  - Continue current regimen  - Sleep study as below   Snoring: Patient reports loud snoring, apneic episodes at night, morning headaches, and daytime fatigue and somnolence which is highly concerning for OSA. She was seen for this in 06/2017 at which time she wanted to discuss sleep study with PCP. On review of chart, it appears she has gained 47 lbs in the past 1.5 months. This is likely an error in documentation as her current weight is consistent with previous visits before 11/2017. She also denies drastic changes in weight and has not noticed changes in her clothes. We did discussed however importance of weight loss in the setting of OSA.  - Sleep study ordered   Past Medical History:  Diagnosis Date  . Asthma   . Hypertension   . Obesity   . Peritonitis ([facility]) 2014   Review of Systems:   Review of Systems  Constitutional: Negative for chills, fever and malaise/fatigue.   Eyes: Positive for blurred vision and photophobia.  Gastrointestinal: Positive for nausea. Negative for abdominal pain and vomiting.  Neurological: Positive for headaches. Negative for dizziness, tingling, sensory change and focal weakness.    Physical Exam:  Vitals:   01/25/18 1515  BP: (!) 142/79  Pulse: 77  Temp: 98.8 F (37.1 C)  TempSrc: Oral  SpO2: 100%  Weight: 288 lb 14.4 oz (131 kg)  Height: 5\' 6"  (1.676 m)   General: young female, well-nourished, well-developed, in no acute distress  HENT: NCAT, OP clear without exudates or erythema  Eyes: EOMI, PERRL, no conjunctival injection  CV: RRR, nl S1/S2, no mrg  Neuro: alert and oriented x3, sensation and strength intact on all extremities    Assessment & Plan:   See Encounters Tab for problem based charting.  Patient discussed with Dr. Heide SparkNarendra

## 2018-01-25 NOTE — Assessment & Plan Note (Signed)
Snoring: Patient reports loud snoring, apneic episodes at night, morning headaches, and daytime fatigue and somnolence which is highly concerning for OSA. She was seen for this in 06/2017 at which time she wanted to discuss sleep study with PCP. On review of chart, it appears she has gained 47 lbs in the past 1.5 months. This is likely an error in documentation as her current weight is consistent with previous visits before 11/2017. She also denies drastic changes in weight and has not noticed changes in her clothes. We did discussed however importance of weight loss in the setting of OSA.  - Sleep study ordered

## 2018-01-25 NOTE — Patient Instructions (Addendum)
Ms. Azucena CecilBurton,   I have ordered a sleep study to evaluate you for sleep apnea. This could be causing the worsening of headaches.  They will call you to set up the appointment for the sleep study.  For your headache you receive an injection of Toradol today.   Please call us if you have any questions or concerns.  - Dr. Evelene CroonSantos

## 2018-01-25 NOTE — Assessment & Plan Note (Signed)
Headache: Patient presents a 1-week history of constant, throbbing headache that she localizes to the occipital region and is associated with nausea, photophobia and blurry vision. She states rest usually improves HA, but this time it has been constant for 1 week. It is worse in the morning and with bending forward. She did not tried her home sumatriptan. Denies fever, chills, and recent trauma. She has a history of migraine headaches that have a similar presentation and is currently on propanolol, topiramite, and sumatriptan PRN. Failed amitriptyline, and OTC remedies (NSAIDs, acetaminophen, and excedrin) do not provide relief. She also presents today with signs/symptoms consistent with OSA for which she was recommended to be evaluated 7 months ago but did not get a sleep study (see below). I suspect her migraine headaches are likely exacerbated by untreated OSA.  - Toradol 30 mg x1 in clinic  - Continue current regimen  - Sleep study as below

## 2018-01-26 NOTE — Progress Notes (Signed)
Internal Medicine Clinic Attending  Case discussed with Dr. Santos-Sanchez at the time of the visit.  We reviewed the resident's history and exam and pertinent patient test results.  I agree with the assessment, diagnosis, and plan of care documented in the resident's note.    

## 2018-01-27 ENCOUNTER — Telehealth: Payer: Self-pay | Admitting: *Deleted

## 2018-01-27 ENCOUNTER — Telehealth: Payer: Self-pay

## 2018-01-27 DIAGNOSIS — I10 Essential (primary) hypertension: Secondary | ICD-10-CM

## 2018-01-27 NOTE — Telephone Encounter (Signed)
Requesting the doctor to order bp cuff. Please call pt back.

## 2018-01-27 NOTE — Telephone Encounter (Signed)
Called pt per dr Criselda Peachesmullen, states she saw her therapist and it was recommended that she stay out of work due to her BP being elevated, she states her meds are not working and request f/u w/ either dr Lovenia Kimsantos-sanchez or dr Criselda Peachesmullen. appt [facility] 8/8 at 1015

## 2018-01-27 NOTE — Telephone Encounter (Signed)
Ordered and left with MarriottChilon Boone.   I may not have time to call her until tomorrow.   Triage, can you call her and see if there is anything else she would like to discuss?   Thanks!  Debe CoderEmily Gavyn Zoss, MD

## 2018-01-28 ENCOUNTER — Other Ambulatory Visit: Payer: Self-pay

## 2018-01-28 ENCOUNTER — Encounter: Payer: Self-pay | Admitting: Internal Medicine

## 2018-01-28 ENCOUNTER — Ambulatory Visit (INDEPENDENT_AMBULATORY_CARE_PROVIDER_SITE_OTHER): Payer: 59 | Admitting: Internal Medicine

## 2018-01-28 VITALS — BP 149/92 | HR 67 | Temp 98.8°F | Ht 66.0 in | Wt 289.1 lb

## 2018-01-28 DIAGNOSIS — I10 Essential (primary) hypertension: Secondary | ICD-10-CM

## 2018-01-28 DIAGNOSIS — Z79899 Other long term (current) drug therapy: Secondary | ICD-10-CM

## 2018-01-28 DIAGNOSIS — J3489 Other specified disorders of nose and nasal sinuses: Secondary | ICD-10-CM | POA: Diagnosis not present

## 2018-01-28 DIAGNOSIS — R0981 Nasal congestion: Secondary | ICD-10-CM | POA: Insufficient documentation

## 2018-01-28 MED ORDER — DM-APAP-CPM 10-325-2 MG PO TABS: 1.0000 | ORAL_TABLET | Freq: Three times a day (TID) | ORAL | 0 refills | Status: AC | PRN

## 2018-01-28 NOTE — Progress Notes (Signed)
   CC: HTN follow up and nasal congestion  HPI:  Stacey Price is a 42 y.o. female with PMH listed below who presents to clinic for follow up of HTN and nasal congestion.   Essential HTN: Patient was evaluated by her behavioral health counselor yesterday and was told her BP was elevated at 144/69. She presents today for follow up. She is on amlodipine 5 mg QD and reports compliance. She has been well controlled on this regimen. She has been experiencing nasal congestion for the past 2 days and has been taking NyQuil. I suspect her elevated blood pressure is likely a side effect of NyQuil intake which has dextromethorphan.  Advised patient to stop taking this medication.  Recommended Coricidin HBP.  Continue amlodipine. - Stop Nyquil - Coricidin HBP for symptom management  - Continue amlodipine 5 mg QD  - DME ordered for BP cuff placed by PCP  - Follow up with PCP   Nasal congestion: Patient presents with 2-day history of nasal congestion and sinus pain.  She also endorses a headache.  Denies fevers, chills, ear pain, sore throat, cough or shortness of breath. Both her niece and her roommate are sick with a cold and experiencing similar symptoms.  Exam only remarkable for sinus tenderness, otherwise OP appears clear and no lymphadenopathy present.  Lungs sound clear. Symptoms likely secondary to common cold given sick contacts at home.  Recommend supportive care.  - Stop Nyquil - Coricidin HBP for symptom management  - Advised to call if symptoms have not resolved in 2 weeks    Past Medical History:  Diagnosis Date  . Asthma   . Hypertension   . Obesity   . Peritonitis (HCC) 2014   Review of Systems:   Review of Systems  Constitutional: Negative for chills and fever.  HENT: Positive for congestion and sinus pain. Negative for ear pain, sore throat and tinnitus.   Neurological: Positive for headaches. Negative for dizziness.   Physical Exam:  Vitals:   01/28/18 1012  BP: (!)  149/92  Pulse: 67  Temp: 98.8 F (37.1 C)  TempSrc: Oral  SpO2: 100%  Weight: 289 lb 1.6 oz (131.1 kg)  Height: 5\' 6"  (1.676 m)   General: Young female, obese, well-developed, no acute distress HEENT: NCATR, tenderness to palpation over sinuses, OP clear without erythema or exudates, TM pearly white without effusion, no LAD  CV: RRR, nl S1/S2, no mrg  Pulm: CTAB, no wheezes or crackles, no increased work of breathing on room air  Assessment & Plan:   See Encounters Tab for problem based charting.  Patient discussed with Dr. Criselda PeachesMullen

## 2018-01-28 NOTE — Telephone Encounter (Signed)
Per Epic, pt has an appt today in [facility]. 

## 2018-01-28 NOTE — Patient Instructions (Signed)
Ms. Azucena CecilBurton,   For your cold, please take Coricidin HBP.  You can take up to 1 tablet 3 times a day as needed for your nasal congestion.  Please call us if your symptoms have not improved within 2 weeks.  Please avoid products with dextromethorphan as this can elevate your blood pressure.  Dr. Criselda PeachesMullen sent a prescription for a blood pressure cough.  You will be contacted no later than 8/12 check if your insurance has approved this.  If not I [facility]  Dr. Theodora BlowMelin will recheck your blood pressure at your next visit and adjust your medications as needed.  Please call us if you have any questions or concerns.  - Dr. Evelene CroonSantos

## 2018-01-28 NOTE — Telephone Encounter (Signed)
Please make an appointment as requested.  [facility] at [facility], so quickest appointment will be with her.   Thank you!

## 2018-01-28 NOTE — Assessment & Plan Note (Signed)
Nasal congestion: Patient presents with 2-day history of nasal congestion and sinus pain.  She also endorses a headache.  Denies fevers, chills, ear pain, sore throat, cough or shortness of breath. Both her niece and her roommate are sick with a cold and experiencing similar symptoms.  Exam only remarkable for sinus tenderness, otherwise OP appears clear and no lymphadenopathy present.  Lungs sound clear.  Symptoms likely secondary to common cold given sick contacts at home.  Recommend supportive care.  - Stop Nyquil - Coricidin HBP for symptom management  - Advised to call if symptoms have not resolved in 2 weeks

## 2018-01-28 NOTE — Assessment & Plan Note (Addendum)
Essential HTN: Patient was evaluated by her behavioral health counselor yesterday and was told her BP was elevated at 144/69. She presents today for follow up. She is on amlodipine 5 mg QD and reports compliance. She has been well controlled on this regimen. She has been experiencing nasal congestion for the past 2 days and has been taking NyQuil. I suspect her elevated blood pressure is likely a side effect of NyQuil intake which has dextromethorphan.  Advised patient to stop taking this medication.  Recommended Coricidin HBP.  Continue amlodipine. - Stop Nyquil - Coricidin HBP for symptom management  - Continue amlodipine 5 mg QD  - DME ordered for BP cuff placed by PCP  - Follow up with PCP

## 2018-01-28 NOTE — Telephone Encounter (Signed)
[facility] BP elevated (sBP 147) likely due to intake of Nyquil. Recommended patient to stop his medication and prescribed Coricidin HBP.  PCP has already placed the order for BP cuff.  Advised patient she can also purchase [facility] at [facility] if insurance does not approve.

## 2018-02-01 NOTE — Progress Notes (Signed)
Internal Medicine Clinic Attending  Case discussed with Dr. Santos-Sanchez at the time of the visit.  We reviewed the resident's history and exam and pertinent patient test results.  I agree with the assessment, diagnosis, and plan of care documented in the resident's note.    

## 2018-02-18 ENCOUNTER — Telehealth: Payer: Self-pay | Admitting: Internal Medicine

## 2018-02-18 DIAGNOSIS — R0683 Snoring: Secondary | ICD-10-CM

## 2018-02-18 DIAGNOSIS — I10 Essential (primary) hypertension: Secondary | ICD-10-CM

## 2018-02-24 ENCOUNTER — Telehealth: Payer: Self-pay | Admitting: Internal Medicine

## 2018-02-24 DIAGNOSIS — G44209 Tension-type headache, unspecified, not intractable: Secondary | ICD-10-CM

## 2018-02-24 DIAGNOSIS — G43919 Migraine, unspecified, intractable, without status migrainosus: Secondary | ICD-10-CM

## 2018-02-24 MED ORDER — SUMATRIPTAN SUCCINATE 50 MG PO TABS: 50.0000 mg | ORAL_TABLET | Freq: Once | ORAL | 2 refills | Status: DC | PRN

## 2018-02-24 MED ORDER — TOPIRAMATE 50 MG PO TABS: ORAL_TABLET | ORAL | 3 refills | Status: DC

## 2018-02-24 NOTE — Telephone Encounter (Signed)
Pt called and is still having bad migraines and would like to know what else can she take.  Pt states she was seen in August and they are know better.  Offered patient an appointment but, she would rather someone give her a call back due to her work schedule.

## 2018-02-24 NOTE — Telephone Encounter (Signed)
Called patient back.   Migraines worse than previously.  She is due to see eye doctor on 9/16.  She is having blurry vision with the migraines.  Migraines are constant, every day or every other day, for about a month.  Phonophobia and photophobia are present.  She is taking topamax, she is taking propranolol at 120mg  daily.  She has run out of sumatriptan.  She was taking the sumatriptan once daily, never felt any relief from pain with this medication.  She stopped taking daily excedrin about 1 month ago.  She wasn't having relief with that medication either.    We discussed options and agreed to this plan  Increase Topamax to max dose of 200mg /day.  Start with 50mg  in the AM and 100mg  in the PM for 1-2 weeks Then increase to 100mg  BID.   Add back excedrin OTC as directed on bottle.   Refill of imitrex sent.   Referral to neurology - headache specialist if possible.   Rivka Barbara - can you help me with the referral?  Thanks  Debe Coder, MD

## 2018-02-24 NOTE — Telephone Encounter (Signed)
Thanks I have notified the patient

## 2018-02-24 NOTE — Telephone Encounter (Signed)
I will let Lela S,referral coordinator, know about referral.

## 2018-02-24 NOTE — Telephone Encounter (Signed)
Pt was denied for an IN-LAB Sleep Study.  Pt's insurance per [facility] is requesting a new Order be placed for an in Home Study.  Please advise if one can be placed.

## 2018-02-24 NOTE — Telephone Encounter (Signed)
Ordered.  Thanks.  Let me know if any issues.

## 2018-03-03 ENCOUNTER — Ambulatory Visit: Payer: 59 | Admitting: Neurology

## 2018-03-03 ENCOUNTER — Encounter: Payer: Self-pay | Admitting: Neurology

## 2018-03-03 VITALS — BP 120/69 | HR 68 | Ht 66.0 in | Wt 285.2 lb

## 2018-03-03 DIAGNOSIS — R51 Headache: Secondary | ICD-10-CM | POA: Diagnosis not present

## 2018-03-03 DIAGNOSIS — R519 Headache, unspecified: Secondary | ICD-10-CM | POA: Insufficient documentation

## 2018-03-03 DIAGNOSIS — G43709 Chronic migraine without aura, not intractable, without status migrainosus: Secondary | ICD-10-CM | POA: Diagnosis not present

## 2018-03-03 DIAGNOSIS — IMO0002 Reserved for concepts with insufficient information to code with codable children: Secondary | ICD-10-CM

## 2018-03-03 DIAGNOSIS — G43919 Migraine, unspecified, intractable, without status migrainosus: Secondary | ICD-10-CM

## 2018-03-03 DIAGNOSIS — G44209 Tension-type headache, unspecified, not intractable: Secondary | ICD-10-CM

## 2018-03-03 MED ORDER — RIZATRIPTAN BENZOATE 10 MG PO TBDP: 10.0000 mg | ORAL_TABLET | ORAL | 11 refills | Status: DC | PRN

## 2018-03-03 MED ORDER — TOPIRAMATE 100 MG PO TABS: 100.0000 mg | ORAL_TABLET | Freq: Two times a day (BID) | ORAL | 11 refills | Status: DC

## 2018-03-03 MED ORDER — ONDANSETRON 4 MG PO TBDP: 4.0000 mg | ORAL_TABLET | Freq: Three times a day (TID) | ORAL | 6 refills | Status: DC | PRN

## 2018-03-03 MED ORDER — FREMANEZUMAB-VFRM 225 MG/1.5ML ~~LOC~~ SOSY: 225.0000 mg | PREFILLED_SYRINGE | SUBCUTANEOUS | 11 refills | Status: DC

## 2018-03-03 MED ORDER — TOPIRAMATE 100 MG PO TABS: 100.0000 mg | ORAL_TABLET | Freq: Two times a day (BID) | ORAL | 4 refills | Status: DC

## 2018-03-03 NOTE — Progress Notes (Signed)
PATIENT: Stacey Price DOB: Jan 03, 1976  Chief Complaint  Patient presents with  . Migraine    Reports headaches nearly daily.  She is currently on Topamax and Inderal.  On 02/24/18, her Topamax was increased to 100mg  BID.  Sumatriptan has not been helpful.  She has noise/light sensitivity and blurred vision with her migraines.   [name] PCP    Inez Catalina, MD     HISTORICAL  Stacey Price is a 42 years old female, seen in request by her primary care physician Dr. Debe Coder for evaluation of migraine, initial evaluation was on March 03, 2018.  I have reviewed and summarized the referring note from the referring physician, she has past medical history of hypertension,  She reported history of chronic migraine since middle school, her typical migraine are lateralized severe pounding headache with associated light noise sensitivity, nauseous, lasting for few hours, over the years, her headache become more frequent, for many years she has been taking Excedrin Migraine 2 tablets twice a day,  Since June 2019, from occasionally headache each month, she began to have every day headaches, she was started on propanolol LA 80 mg since June 2019, with limited help of her migraine, she was taking Topamax 25 mg twice a day, dosage was increased to 100 mg twice a day a week ago, but no significant decrease of her migraine headaches yet,  Abortive treatment, she has tried Imitrex with limited help, over time she has to combine it with Mobic, even go back to her daily Excedrin Migraine headaches again   REVIEW OF SYSTEMS: Full 14 system review of systems performed and notable only for as above. All other review of systems were negative.  ALLERGIES: No Known Allergies  HOME MEDICATIONS: Current Outpatient Medications  Medication Sig Dispense Refill  . albuterol (PROVENTIL HFA;VENTOLIN HFA) 108 (90 Base) MCG/ACT inhaler Inhale into the lungs every 6 (six) hours as needed for wheezing or  shortness of breath.    [name] amLODipine (NORVASC) 5 MG tablet Take 1 tablet (5 mg total) by mouth daily. 30 tablet 1  . citalopram (CELEXA) 10 MG tablet Take 10 mg by mouth daily.  2  . diclofenac sodium (VOLTAREN) 1 % GEL Apply 2 g topically 4 (four) times daily. 1 Tube 0  . dicyclomine (BENTYL) 20 MG tablet Take 1 tablet (20 mg total) by mouth 3 times/day as needed-between meals & bedtime (Abdominal pain). 20 tablet 0  . Fluticasone-Salmeterol (ADVAIR) 250-50 MCG/DOSE AEPB Inhale 1 puff into the lungs 2 (two) times daily.    [name] ketoconazole (NIZORAL) 2 % cream Apply 1 application topically daily. To Ankle 15 g 0  . meloxicam (MOBIC) 7.5 MG tablet Take 1 tablet (7.5 mg total) by mouth 2 (two) times daily as needed for pain. 30 tablet 1  . mupirocin ointment (BACTROBAN) 2 % Place 1 application into the nose 2 (two) times daily. 22 g 0  . omeprazole (PRILOSEC) 20 MG capsule Take 20 mg by mouth daily.    . propranolol ER (INDERAL LA) 80 MG 24 hr capsule Take 80 mg by mouth daily.  1  . SUMAtriptan (IMITREX) 50 MG tablet Take 1 tablet (50 mg total) by mouth once as needed for migraine. May repeat in 2 hours if headache persists or recurs. 30 tablet 2  . topiramate (TOPAMAX) 50 MG tablet take 1 tablet (50 mg total) in the morning and 2 tablets in the evening for 7 days THEN take 2 tablets BID 60 tablet  3  . traZODone (DESYREL) 50 MG tablet Take 50 mg by mouth at bedtime.    [name] loratadine (CLARITIN) 10 MG tablet Take 1 tablet (10 mg total) by mouth daily as needed for allergies. 30 tablet 2   No current facility-administered medications for this visit.     PAST MEDICAL HISTORY: Past Medical History:  Diagnosis Date  . Asthma   . Hypertension   . Migraines   . Obesity   . Peritonitis (HCC) 2014    PAST SURGICAL HISTORY: Past Surgical History:  Procedure Laterality Date  . ABDOMINAL HYSTERECTOMY  2009   [facility]   . CESAREAN SECTION     1995-2000    FAMILY HISTORY: Family History  Problem  Relation Age of Onset  . Hypertension Mother   . Hypertension Father   . Breast cancer Cousin   . Ovarian cancer Paternal Grandmother     SOCIAL HISTORY: Social History   Socioeconomic History  . Marital status: Single    Spouse name: Not on file  . Number of children: 2  . Years of education: 10  . Highest education level: Not on file  Occupational History  . Occupation: collections and recovery agent  Social Needs  . Financial resource strain: Not on file  . Food insecurity:    Worry: Not on file    Inability: Not on file  . Transportation needs:    Medical: Not on file    Non-medical: Not on file  Tobacco Use  . Smoking status: Former Smoker    Last attempt to quit: 11/23/2014    Years since quitting: 3.2  . Smokeless tobacco: Never Used  Substance and Sexual Activity  . Alcohol use: No    Frequency: Never  . Drug use: No  . Sexual activity: Yes    Partners: Female    Birth control/protection: None  Lifestyle  . Physical activity:    Days per week: Not on file    Minutes per session: Not on file  . Stress: Not on file  Relationships  . Social connections:    Talks on phone: Not on file    Gets together: Not on file    Attends religious service: Not on file    Active member of club or organization: Not on file    Attends meetings of clubs or organizations: Not on file    Relationship status: Not on file  . Intimate partner violence:    Fear of current or ex partner: Not on file    Emotionally abused: Not on file    Physically abused: Not on file    Forced sexual activity: Not on file  Other Topics Concern  . Not on file  Social History Narrative   Lives at home with a friend.   Right-handed.   1 cup caffeine some days.     PHYSICAL EXAM   Vitals:   03/03/18 1301  BP: 120/69  Pulse: 68  Weight: 285 lb 4 oz (129.4 kg)  Height: 5\' 6"  (1.676 m)    Not recorded      Body mass index is 46.04 kg/m.  PHYSICAL EXAMNIATION:  Gen: NAD,  conversant, well nourised, obese, well groomed                     Cardiovascular: Regular rate rhythm, no peripheral edema, warm, nontender. Eyes: Conjunctivae clear without exudates or hemorrhage Neck: Supple, no carotid bruits. Pulmonary: Clear to auscultation bilaterally   NEUROLOGICAL EXAM:  MENTAL  STATUS: Speech:    Speech is normal; fluent and spontaneous with normal comprehension.  Cognition:     Orientation to time, place and person     Normal recent and remote memory     Normal Attention span and concentration     Normal Language, naming, repeating,spontaneous speech     Fund of knowledge   CRANIAL NERVES: CN II: Visual fields are full to confrontation. Fundoscopic exam is normal with sharp discs and no vascular changes. Pupils are round equal and briskly reactive to light. CN III, IV, VI: extraocular movement are normal. No ptosis. CN V: Facial sensation is intact to pinprick in all 3 divisions bilaterally. Corneal responses are intact.  CN VII: Face is symmetric with normal eye closure and smile. CN VIII: Hearing is normal to rubbing fingers CN IX, X: Palate elevates symmetrically. Phonation is normal. CN XI: Head turning and shoulder shrug are intact CN XII: Tongue is midline with normal movements and no atrophy.  MOTOR: There is no pronator drift of out-stretched arms. Muscle bulk and tone are normal. Muscle strength is normal.  REFLEXES: Reflexes are 2+ and symmetric at the biceps, triceps, knees, and ankles. Plantar responses are flexor.  SENSORY: Intact to light touch, pinprick, positional sensation and vibratory sensation are intact in fingers and toes.  COORDINATION: Rapid alternating movements and fine finger movements are intact. There is no dysmetria on finger-to-nose and heel-knee-shin.    GAIT/STANCE: Posture is normal. Gait is steady with normal steps, base, arm swing, and turning. Heel and toe walking are normal. Tandem gait is normal.  Romberg is  absent.   DIAGNOSTIC DATA (LABS, IMAGING, TESTING) - I reviewed patient records, labs, notes, testing and imaging myself where available.   ASSESSMENT AND PLAN  Rosalena Mccorry is a 42 y.o. female   Chronic migraine Medication rebound headaches  Keep preventive medication Topamax 100 mg twice a day  Inderal LA 80 mg daily  Suboptimal response to Imitrex as needed, will try Maxalt 10 mg as needed, may mix together with Zofran, Mobic as needed  Stop daily Excedrin Migraine use   Levert Feinstein, M.D. Ph.D.  [facility] [address], [address] [place], [place] [address] Ph: [phone] Fax: [phone]  CC:  Inez Catalina, MD

## 2018-03-08 ENCOUNTER — Telehealth: Payer: Self-pay | Admitting: *Deleted

## 2018-03-08 NOTE — Telephone Encounter (Signed)
Attempted PA on covermymeds. Received the following response: "Please advise the dispensing pharmacy to contact the Pharmacy Help Line at [phone] for assistance".

## 2018-03-08 NOTE — Telephone Encounter (Signed)
I called [facility] and spoke with Yasmin. Completed PA over the phone. Answered clinical questions.  PA approved effective 03/08/18-06/07/18. [id number].

## 2018-03-08 NOTE — Telephone Encounter (Signed)
Initiated PA Ajovy on covermymeds. KeyEthel Rana: [id number] - Rx #: [id number]: [id number]. In process of completing.

## 2018-03-16 NOTE — Telephone Encounter (Signed)
Pt has called and was given last update from RN Kara MeadEmma re: PA  approved effective 03/08/18-06/07/18. [id number]. Pt states pharmacy is telling her they don't have any information about this please give pt a call.  [facility]  [facility] [place] - [address] [phone] (Phone) [phone] (Fax)

## 2018-03-16 NOTE — Telephone Encounter (Addendum)
I called pharmacy. They were running rx claim incorrectly. They were able to get this fixed and got a paid claim. It will be 20.00 for pt. I called pt back and relayed this information. She verbalized understanding and appreciation.

## 2018-03-17 MED ORDER — FREMANEZUMAB-VFRM 225 MG/1.5ML ~~LOC~~ SOSY: 225.0000 mg | PREFILLED_SYRINGE | SUBCUTANEOUS | 11 refills | Status: DC

## 2018-03-17 MED FILL — AJOVY 225 MG/1.5ML SOSY: 225 | 30 days supply | Qty: 2 | Fill #0

## 2018-03-17 NOTE — Addendum Note (Signed)
Addended by: Hillis Range on: 03/17/2018 09:40 AM   Modules accepted: Orders

## 2018-03-17 NOTE — Telephone Encounter (Signed)
Re-sent rx to [facility] outpt pharmacy as requested.

## 2018-03-17 NOTE — Telephone Encounter (Signed)
Patient called back stating [facility] has Fremanezumab-vfrm (AJOVY) 225 MG/1.5ML SOSY. Please send to this pharmacy.

## 2018-03-17 NOTE — Telephone Encounter (Signed)
Patient states [facility] does not have Fremanezumab-vfrm (AJOVY) 225 MG/1.5ML SOSY and not sure when they will get it. Patient said she will check with other pharmacies to see if they have drug and will call us back .

## 2018-03-23 ENCOUNTER — Ambulatory Visit (HOSPITAL_BASED_OUTPATIENT_CLINIC_OR_DEPARTMENT_OTHER): Payer: 59 | Attending: Internal Medicine | Admitting: Internal Medicine

## 2018-03-23 VITALS — Ht 67.0 in | Wt 285.0 lb

## 2018-03-23 DIAGNOSIS — R0683 Snoring: Secondary | ICD-10-CM | POA: Diagnosis not present

## 2018-03-23 DIAGNOSIS — I10 Essential (primary) hypertension: Secondary | ICD-10-CM | POA: Insufficient documentation

## 2018-03-23 DIAGNOSIS — G4733 Obstructive sleep apnea (adult) (pediatric): Secondary | ICD-10-CM

## 2018-03-27 ENCOUNTER — Other Ambulatory Visit: Payer: Self-pay

## 2018-03-27 ENCOUNTER — Emergency Department (HOSPITAL_BASED_OUTPATIENT_CLINIC_OR_DEPARTMENT_OTHER)
Admission: EM | Admit: 2018-03-27 | Discharge: 2018-03-27 | Disposition: A | Discharge status: Home / Self Care | Payer: 59 | Attending: Emergency Medicine | Admitting: Emergency Medicine

## 2018-03-27 ENCOUNTER — Encounter (HOSPITAL_BASED_OUTPATIENT_CLINIC_OR_DEPARTMENT_OTHER): Payer: Self-pay | Admitting: *Deleted

## 2018-03-27 DIAGNOSIS — I10 Essential (primary) hypertension: Secondary | ICD-10-CM | POA: Diagnosis not present

## 2018-03-27 DIAGNOSIS — K1379 Other lesions of oral mucosa: Secondary | ICD-10-CM | POA: Diagnosis not present

## 2018-03-27 DIAGNOSIS — J45909 Unspecified asthma, uncomplicated: Secondary | ICD-10-CM | POA: Diagnosis not present

## 2018-03-27 DIAGNOSIS — Z79899 Other long term (current) drug therapy: Secondary | ICD-10-CM | POA: Insufficient documentation

## 2018-03-27 DIAGNOSIS — Z8542 Personal history of malignant neoplasm of other parts of uterus: Secondary | ICD-10-CM | POA: Insufficient documentation

## 2018-03-27 DIAGNOSIS — Z87891 Personal history of nicotine dependence: Secondary | ICD-10-CM | POA: Diagnosis not present

## 2018-03-27 DIAGNOSIS — B001 Herpesviral vesicular dermatitis: Secondary | ICD-10-CM

## 2018-03-27 NOTE — Discharge Instructions (Signed)
You can try abreva or you can just give it time and it will go away.  Tylenol/ibuprofen as needed for pain.

## 2018-03-27 NOTE — ED Notes (Signed)
Pt/family verbalized understanding of discharge instructions.   

## 2018-03-27 NOTE — ED Triage Notes (Signed)
Pt reports blisters to lips since yesterday. States lips feel "tingly"

## 2018-03-27 NOTE — ED Provider Notes (Signed)
[facility] EMERGENCY DEPARTMENT Provider Note   CSN: [id number] Arrival date & time: 03/27/18  1928     History   Chief Complaint Chief Complaint  Patient presents with  . Blister    HPI Stacey Price is a 42 y.o. female.  Patient is a 42 year old female with a history of hypertension, migraines, asthma who is presenting today with a sore on her lip.  She states yesterday she noticed some pain and swelling to the left upper lip and today when she woke up there was a blister.  She denies any issues on the inside of the mouth, tongue or throat.  She has not had any respiratory complaints like cold symptoms.  She has not had a fever.  She denies any sore throat.  She has never had anything like this before as far she knows.  She did recently start a new medication but denies any unusual foods.  She has no shortness of breath or feelings of her throat swelling.  The history is provided by the patient.    Past Medical History:  Diagnosis Date  . Asthma   . Hypertension   . Migraines   . Obesity   . Peritonitis [facility]) 2014    Patient Active Problem List   Diagnosis Date Noted  . Chronic migraine 03/03/2018  . Chronic daily headache 03/03/2018  . Nasal congestion with rhinorrhea 01/28/2018  . History of uterine cancer 12/11/2017  . History of treatment for tuberculosis 12/11/2017  . Bacterial sinusitis 08/28/2017  . 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 Snoring 07/18/2017  . Palpitation 07/16/2017  . Migraine headache 04/02/2017  . Tension headache 02/09/2017  . Epistaxis 02/09/2017  . Obesity 01/20/2017  . Rash and nonspecific skin eruption 12/28/2016  . Allergic rhinitis 10/15/2016  . Breast skin changes 04/15/2016  . HTN (hypertension) 10/12/2015  . Asthma 10/12/2015  . Hives 10/12/2015  . Breast pain 10/12/2015  . Tinea versicolor 10/12/2015    Past Surgical History:  Procedure Laterality Date  . ABDOMINAL HYSTERECTOMY  2009   [place]   . CESAREAN SECTION       1995-2000     OB History   None      Home Medications    Prior to Admission medications   Medication Sig Start Date End Date Taking? Authorizing Provider  albuterol (PROVENTIL HFA;VENTOLIN HFA) 108 (90 Base) MCG/ACT inhaler Inhale into the lungs every 6 (six) hours as needed for wheezing or shortness of breath.    [provider]  amLODipine (NORVASC) 5 MG tablet Take 1 tablet (5 mg total) by mouth daily. 12/26/16   Eulah Pont, MD  citalopram (CELEXA) 10 MG tablet Take 10 mg by mouth daily. 10/01/17   [provider]  diclofenac sodium (VOLTAREN) 1 % GEL Apply 2 g topically 4 (four) times daily. 01/12/17   Eulah Pont, MD  dicyclomine (BENTYL) 20 MG tablet Take 1 tablet (20 mg total) by mouth 3 times/day as needed-between meals & bedtime (Abdominal pain). 12/11/17   Rolland Porter, MD  Fluticasone-Salmeterol (ADVAIR) 250-50 MCG/DOSE AEPB Inhale 1 puff into the lungs 2 (two) times daily.    [provider]  Fremanezumab-vfrm (AJOVY) 225 MG/1.5ML SOSY Inject 225 mg into the skin every 30 (thirty) days. 03/17/18   York Spaniel, MD  ketoconazole (NIZORAL) 2 % cream Apply 1 application topically daily. To Ankle 07/22/17   Inez Catalina, MD  loratadine (CLARITIN) 10 MG tablet Take 1 tablet (10 mg total) by mouth daily as needed  for allergies. 02/11/17 02/11/18  Inez Catalina, MD  meloxicam (MOBIC) 7.5 MG tablet Take 1 tablet (7.5 mg total) by mouth 2 (two) times daily as needed for pain. 02/11/17   Inez Catalina, MD  mupirocin ointment (BACTROBAN) 2 % Place 1 application into the nose 2 (two) times daily. 12/26/16   Eulah Pont, MD  omeprazole (PRILOSEC) 20 MG capsule Take 20 mg by mouth daily.    [provider]  ondansetron (ZOFRAN ODT) 4 MG disintegrating tablet Take 1 tablet (4 mg total) by mouth every 8 (eight) hours as needed. 03/03/18   Levert Feinstein, MD  propranolol ER (INDERAL LA) 80 MG 24 hr capsule Take 80 mg by mouth daily. 10/07/17   [provider]  rizatriptan (MAXALT-MLT) 10 MG disintegrating tablet Take 1 tablet (10 mg total) by mouth as needed. May repeat in 2 hours if needed 03/03/18   Levert Feinstein, MD  topiramate (TOPAMAX) 100 MG tablet Take 1 tablet (100 mg total) by mouth 2 (two) times daily. 03/03/18   Levert Feinstein, MD  traZODone (DESYREL) 50 MG tablet Take 50 mg by mouth at bedtime.    [provider]    Family History Family History  Problem Relation Age of Onset  . Hypertension Mother   . Hypertension Father   . Breast cancer Cousin   . Ovarian cancer Paternal Grandmother     Social History Social History   Tobacco Use  . Smoking status: Former Smoker    Last attempt to quit: 11/23/2014    Years since quitting: 3.3  . Smokeless tobacco: Never Used  Substance Use Topics  . Alcohol use: Yes    Frequency: Never    Comment: Rare  . Drug use: No     Allergies   Patient has no known allergies.   Review of Systems Review of Systems  All other systems reviewed and are negative.    Physical Exam Updated Vital Signs BP 138/84 (BP Location: Left Arm)   Pulse 64   Temp 98.4 F (36.9 C) (Oral)   Resp 18   SpO2 100%   Physical Exam  Constitutional: She is oriented to person, place, and time. She appears well-developed and well-nourished. No distress.  HENT:  Head: Normocephalic and atraumatic.  Mouth/Throat: Uvula is midline, oropharynx is clear and moist and mucous membranes are normal. Normal dentition.    Eyes: Pupils are equal, round, and reactive to light.  Cardiovascular: Normal rate.  Pulmonary/Chest: Effort normal.  Neurological: She is alert and oriented to person, place, and time.  Skin: Skin is warm and dry.  Psychiatric: She has a normal mood and affect. Her behavior is normal.  Nursing note and vitals reviewed.    ED Treatments / Results  Labs (all labs ordered are listed, but only abnormal results are displayed) Labs Reviewed - No data to display  EKG None  Radiology No  results found.  Procedures Procedures (including critical care time)  Medications Ordered in ED Medications - No data to display   Initial Impression / Assessment and Plan / ED Course  I have reviewed the triage vital signs and the nursing notes.  Pertinent labs & imaging results that were available during my care of the patient were reviewed by me and considered in my medical decision making (see chart for details).     Patient presents with symptoms most consistent with HSV 1.  No evidence of abscess, dental issues and she denies any URI symptoms.  Supportive  care at this point and encouraged to avoid sharing drinks or kissing people to prevent spread  Final Clinical Impressions(s) / ED Diagnoses   Final diagnoses:  Cold sore    ED Discharge Orders    None       Gwyneth Sprout, MD 03/27/18 2014

## 2018-03-28 DIAGNOSIS — G4733 Obstructive sleep apnea (adult) (pediatric): Secondary | ICD-10-CM | POA: Diagnosis not present

## 2018-03-28 NOTE — Procedures (Signed)
  Patient Name: Stacey Price, Stacey Price Date: 03/24/2018 Gender: Female D.O.B: 1976-05-20 Age (years): 42 Referring Provider: Debe Coder Height (inches): 67 Interpreting Physician: Jetty Duhamel MD, ABSM Weight (lbs): 285 RPSGT: Celene Kras BMI: 45 MRN: [id number] Neck Size: 14.50  CLINICAL INFORMATION Sleep Study Type: HST Indication for sleep study: Hypertension, Snoring  Epworth Sleepiness Score: 3  SLEEP STUDY TECHNIQUE A multi-channel overnight portable sleep study was performed. The channels recorded were: nasal airflow, thoracic respiratory movement, and oxygen saturation with a pulse oximetry. Snoring was also monitored.  MEDICATIONS Patient self administered medications include: none reported.  SLEEP ARCHITECTURE Patient was studied for 357.7 minutes. The sleep efficiency was 97.8 % and the patient was supine for 99.8%. The arousal index was 0.0 per hour.  RESPIRATORY PARAMETERS The overall AHI was 5.0 per hour, with a central apnea index of 0.0 per hour. The oxygen nadir was 86% during sleep.  CARDIAC DATA Mean heart rate during sleep was 69.6 bpm.  IMPRESSIONS - Occasional obstructive sleep apnea occurred during this study, at the upper limits of normal (AHI = 5.0/h). - No significant central sleep apnea occurred during this study (CAI = 0.0/h). - Oxygen desaturation was noted during this study (Min O2 = 86%, Mean 95%). - Patient snored.  DIAGNOSIS - Normal  RECOMMENDATIONS - Treat symptoms if needed- observation, weight loss, sleep position off back, manage for snoring. - Be careful with alcohol, sedatives and other CNS depressants that may worsen sleep apnea and disrupt normal sleep architecture. - Sleep hygiene should be reviewed to assess factors that may improve sleep quality. - Weight management and regular exercise should be initiated or continued.  [Electronically signed] 03/28/2018 11:17 AM  Jetty Duhamel MD, ABSM Diplomate, American Board  of Sleep Medicine   NPI: [id number]                           Jetty Duhamel Diplomate, American Board of Sleep Medicine  ELECTRONICALLY SIGNED ON:  03/28/2018, 11:13 AM [facility] PH: [phone]   FX: [phone] ACCREDITED BY THE AMERICAN ACADEMY OF SLEEP MEDICINE

## 2018-03-31 ENCOUNTER — Ambulatory Visit (INDEPENDENT_AMBULATORY_CARE_PROVIDER_SITE_OTHER): Payer: 59 | Admitting: Internal Medicine

## 2018-03-31 ENCOUNTER — Encounter: Payer: Self-pay | Admitting: Internal Medicine

## 2018-03-31 ENCOUNTER — Other Ambulatory Visit: Payer: Self-pay

## 2018-03-31 DIAGNOSIS — R0683 Snoring: Secondary | ICD-10-CM

## 2018-03-31 DIAGNOSIS — I10 Essential (primary) hypertension: Secondary | ICD-10-CM | POA: Diagnosis not present

## 2018-03-31 DIAGNOSIS — Q382 Macroglossia: Secondary | ICD-10-CM | POA: Diagnosis not present

## 2018-03-31 DIAGNOSIS — IMO0002 Reserved for concepts with insufficient information to code with codable children: Secondary | ICD-10-CM

## 2018-03-31 DIAGNOSIS — G43709 Chronic migraine without aura, not intractable, without status migrainosus: Secondary | ICD-10-CM | POA: Diagnosis not present

## 2018-03-31 DIAGNOSIS — J4520 Mild intermittent asthma, uncomplicated: Secondary | ICD-10-CM

## 2018-03-31 MED ORDER — AMLODIPINE BESYLATE 5 MG PO TABS: 5.0000 mg | ORAL_TABLET | Freq: Every day | ORAL | 1 refills | Status: DC

## 2018-03-31 MED ORDER — FLUTICASONE-SALMETEROL 250-50 MCG/DOSE IN AEPB: 1.0000 | INHALATION_SPRAY | Freq: Two times a day (BID) | RESPIRATORY_TRACT | 6 refills | Status: DC

## 2018-03-31 MED ORDER — ALBUTEROL SULFATE HFA 108 (90 BASE) MCG/ACT IN AERS: 1.0000 | INHALATION_SPRAY | Freq: Four times a day (QID) | RESPIRATORY_TRACT | 6 refills | Status: DC | PRN

## 2018-03-31 NOTE — Assessment & Plan Note (Signed)
Well controlled today.  She is asking for refills of her advair and albuterol which were supplied to her.  She only uses albuterol intermittently.

## 2018-03-31 NOTE — Progress Notes (Signed)
   Subjective:    Patient ID: Stacey Price, female    DOB: [birth year], 42 y.o.   MRN: [id number]  CC: 6 month follow up for HTN  HPI  Stacey Price is a 42yo woman with PMH of HTN, Migraine, allergy/asthma who presents for follow up.  She has been seen since I last saw her for migraines - now following with Dr. Terrace Arabia in Neurology; sinusitis which is resolved and a cold.  She is doing well today.  She reports no new complaints.  She was seen in March by Dr. 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  Discussed with my partner, Dr. Cyndie Chime, who thinks this is most likely related to a nonspecific inflammatory process, not consistent with amyloidosis.   She notes that her migraines are slowly improving with injectable therapy (Ajovy).  We reviewed her sleep study which showed normal sleep, but snoring.  Noted that weight management and exercise should be initiated which we discussed today.  I also gave her information on good sleep hygiene (Handout from [place]).     Review of Systems  Constitutional: Positive for activity change and fatigue. Negative for appetite change and unexpected weight change.  HENT: Negative for congestion and nosebleeds.   Neurological: Negative for dizziness, weakness and headaches.  Psychiatric/Behavioral: Positive for dysphoric mood and sleep disturbance. The patient is not nervous/anxious.        Objective:   Physical Exam  Constitutional: She is oriented to person, place, and time. She appears well-developed and well-nourished. No distress.  HENT:  Head: Normocephalic and atraumatic.  Cardiovascular: Normal rate.  Pulmonary/Chest: Effort normal. No respiratory distress.  Neurological: She is alert and oriented to person, place, and time.  Psychiatric: She has a normal mood and affect. Her behavior is normal.  Vitals reviewed.   No labs today      Assessment & Plan:  Return in 4-6 months.    Depression - She follows with a psychology group who manages her medications. She scored a 19 on the PHQ-9 today.  She notes that she is managing okay and doing well with her therapist.  She has gotten accommodations based on these issues in the past and is due for paperwork to be filled out, which I will do today.

## 2018-03-31 NOTE — Assessment & Plan Note (Signed)
Current BMI is 44.  Her weight is 285.  She has started an exercise regimen recommended by her Cardiologist and notes that she has lost 5 pounds.  We discussed dietary recommendations and better sleep as well.

## 2018-03-31 NOTE — Assessment & Plan Note (Addendum)
At last visit in March where this was addressed, she was checked for B12 which was normal and SPEP and serum light chains.  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  I discussed this with Dr. Cyndie Chime in hematology who noted that this is likely related to a not otherwise specified inflammatory process.  No further work up unless unexplained symptoms arise.  Will discuss further with patient at next visit.

## 2018-03-31 NOTE — Assessment & Plan Note (Signed)
She is doing well today. BP on check was 132/73. She does have chronic headaches, but no other symptoms today.  She notes that she is taking her amlodipine, however, refill history would make it seem she has been out.  She is on propranolol for migraines as well.  Given the plan will be to stop propranolol now that she is on Ajovy for migraines, will refill amlodipine.    Plan BP cuff at home, will ask to bring in BP log Continue amlodipine.   Check BMET and BP at next visit.

## 2018-03-31 NOTE — Patient Instructions (Addendum)
Stacey Price - -  It was a pleasure to see you today.  I will fill your yearly paperwork out for you.   Please come back to see me in 4-6 months, sooner if needed.   Thank you!

## 2018-03-31 NOTE — Assessment & Plan Note (Signed)
We reviewed her sleep study and discussed weight loss and exercise.  I also gave her information on good sleep hygiene.  Continue to monitor.

## 2018-03-31 NOTE — Assessment & Plan Note (Signed)
She is following with Dr. Terrace Arabia in Neurology.  She is taking Topamax and Ajovy right now.  She notes that she might not be taking propranolol anymore, but she cannot remember.  She is hopeful that her migraine days improve.    Plan Continue follow up with Neurology.

## 2018-04-01 ENCOUNTER — Telehealth: Payer: Self-pay | Admitting: Internal Medicine

## 2018-04-01 NOTE — Telephone Encounter (Signed)
Pt seen yesterday by Dr. Criselda Peaches and forgot to mention a supplement she wanted to start taking called "Liver Focus Formula" 120 vegetable capsules.  Pt wants to know if it is okay for her to start taking them.

## 2018-04-01 NOTE — Telephone Encounter (Signed)
Please ask her to send a list of ingredients and I will review.  Thank you

## 2018-04-02 NOTE — Telephone Encounter (Signed)
Pt stated she has to come to the office on Monday and will bring the bottle with her.

## 2018-04-02 NOTE — Telephone Encounter (Signed)
Called pt - no answer; left message to call the office . 

## 2018-04-05 NOTE — Telephone Encounter (Signed)
Liver Focus - per 2 capsules Vit C 100 mg  Zinc 30 mg  Selenium 100 mg Biotin 300 mg  Calcium 25 mg  Plus: Proprietary Blend of Milk thistle    Chanca piedra Dandelion extract    Alpha-Lipoic Acid Tumeric extract Antichoke extract

## 2018-04-06 NOTE — Telephone Encounter (Signed)
This looks fine.  These are not high doses of any of the minerals that I can see in this list.

## 2018-04-06 NOTE — Telephone Encounter (Signed)
Called pt - left message ok to take Liver Focus, "this looks fine" per Dr Criselda Peaches but if after start taking she feels they do not agree with her/reaction, to stop.

## 2018-04-13 ENCOUNTER — Emergency Department (HOSPITAL_BASED_OUTPATIENT_CLINIC_OR_DEPARTMENT_OTHER)
Admission: EM | Admit: 2018-04-13 | Discharge: 2018-04-13 | Disposition: A | Discharge status: Home / Self Care | Payer: 59 | Attending: Emergency Medicine | Admitting: Emergency Medicine

## 2018-04-13 ENCOUNTER — Encounter (HOSPITAL_BASED_OUTPATIENT_CLINIC_OR_DEPARTMENT_OTHER): Payer: Self-pay | Admitting: Emergency Medicine

## 2018-04-13 ENCOUNTER — Other Ambulatory Visit: Payer: Self-pay

## 2018-04-13 DIAGNOSIS — Z79899 Other long term (current) drug therapy: Secondary | ICD-10-CM | POA: Diagnosis not present

## 2018-04-13 DIAGNOSIS — J45909 Unspecified asthma, uncomplicated: Secondary | ICD-10-CM | POA: Diagnosis not present

## 2018-04-13 DIAGNOSIS — Z87891 Personal history of nicotine dependence: Secondary | ICD-10-CM | POA: Diagnosis not present

## 2018-04-13 DIAGNOSIS — I10 Essential (primary) hypertension: Secondary | ICD-10-CM | POA: Insufficient documentation

## 2018-04-13 DIAGNOSIS — Y998 Other external cause status: Secondary | ICD-10-CM | POA: Insufficient documentation

## 2018-04-13 DIAGNOSIS — X58XXXA Exposure to other specified factors, initial encounter: Secondary | ICD-10-CM | POA: Insufficient documentation

## 2018-04-13 DIAGNOSIS — Y929 Unspecified place or not applicable: Secondary | ICD-10-CM | POA: Insufficient documentation

## 2018-04-13 DIAGNOSIS — S31801A Laceration without foreign body of unspecified buttock, initial encounter: Secondary | ICD-10-CM | POA: Diagnosis present

## 2018-04-13 DIAGNOSIS — Y939 Activity, unspecified: Secondary | ICD-10-CM | POA: Insufficient documentation

## 2018-04-13 DIAGNOSIS — S31812A Laceration with foreign body of right buttock, initial encounter: Secondary | ICD-10-CM | POA: Diagnosis not present

## 2018-04-13 NOTE — Discharge Instructions (Signed)
Your exam today revealed a 2 mm skin tear right on your buttock fold.  There was no evidence of infection with no redness, drainage, purulence, or fluctuance.  I suspect that this started yesterday when he sat down causing the skin to stretch and just barely open up causing your very painful injury.  Please use the antibiotic gel we provided and applied to prevent infections.  Please be careful not to sit down to quickly and cause it to open further.  Please watch for signs and symptoms of infection.  If any symptoms change or worsen, please return to the nearest emergency department.  Please follow-up with your PCP.

## 2018-04-13 NOTE — ED Notes (Signed)
ED Provider at bedside. 

## 2018-04-13 NOTE — ED Triage Notes (Signed)
Abscess on buttocks since yesterday.

## 2018-04-13 NOTE — ED Provider Notes (Signed)
[facility] EMERGENCY DEPARTMENT Provider Note   CSN: [id number] Arrival date & time: 04/13/18  4098     History   Chief Complaint Chief Complaint  Patient presents with  . Abscess    HPI Stacey Price is a 42 y.o. female.  The history is provided by the patient and medical records. No language interpreter was used.  Laceration   The incident occurred yesterday. The laceration is located on the back. Size: 2mm. The laceration mechanism is unknown.The pain is at a severity of 5/10. The pain is mild. The pain has been constant since onset. She reports no foreign bodies present. Her tetanus status is unknown.    Past Medical History:  Diagnosis Date  . Asthma   . Hypertension   . Migraines   . Obesity   . Peritonitis [facility]) 2014    Patient Active Problem List   Diagnosis Date Noted  . Chronic migraine 03/03/2018  . Chronic daily headache 03/03/2018  . History of uterine cancer 12/11/2017  . History of treatment for tuberculosis 12/11/2017  . 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 Snoring 07/18/2017  . Palpitation 07/16/2017  . Migraine headache 04/02/2017  . Tension headache 02/09/2017  . Obesity, Class III, BMI 40-49.9 (morbid obesity) (HCC) 01/20/2017  . Allergic rhinitis 10/15/2016  . Breast skin changes 04/15/2016  . HTN (hypertension) 10/12/2015  . Asthma 10/12/2015  . Hives 10/12/2015  . Breast pain 10/12/2015  . Tinea versicolor 10/12/2015    Past Surgical History:  Procedure Laterality Date  . ABDOMINAL HYSTERECTOMY  2009   [place]   . CESAREAN SECTION     1995-2000     OB History   None      Home Medications    Prior to Admission medications   Medication Sig Start Date End Date Taking? Authorizing Provider  albuterol (PROVENTIL HFA;VENTOLIN HFA) 108 (90 Base) MCG/ACT inhaler Inhale 1-2 puffs into the lungs every 6 (six) hours as needed for wheezing or shortness of breath. 03/31/18   Inez Catalina, MD  amLODipine (NORVASC) 5 MG tablet Take  1 tablet (5 mg total) by mouth daily. 03/31/18   Inez Catalina, MD  citalopram (CELEXA) 10 MG tablet Take 10 mg by mouth daily. 10/01/17   [provider]  diclofenac sodium (VOLTAREN) 1 % GEL Apply 2 g topically 4 (four) times daily. 01/12/17   Eulah Pont, MD  dicyclomine (BENTYL) 20 MG tablet Take 1 tablet (20 mg total) by mouth 3 times/day as needed-between meals & bedtime (Abdominal pain). 12/11/17   Rolland Porter, MD  Fluticasone-Salmeterol (ADVAIR) 250-50 MCG/DOSE AEPB Inhale 1 puff into the lungs 2 (two) times daily. 03/31/18   Inez Catalina, MD  Fremanezumab-vfrm (AJOVY) 225 MG/1.5ML SOSY Inject 225 mg into the skin every 30 (thirty) days. 03/17/18   York Spaniel, MD  ketoconazole (NIZORAL) 2 % cream Apply 1 application topically daily. To Ankle 07/22/17   Inez Catalina, MD  loratadine (CLARITIN) 10 MG tablet Take 1 tablet (10 mg total) by mouth daily as needed for allergies. 02/11/17 02/11/18  Inez Catalina, MD  meloxicam (MOBIC) 7.5 MG tablet Take 1 tablet (7.5 mg total) by mouth 2 (two) times daily as needed for pain. 02/11/17   Inez Catalina, MD  mupirocin ointment (BACTROBAN) 2 % Place 1 application into the nose 2 (two) times daily. 12/26/16   Eulah Pont, MD  omeprazole (PRILOSEC) 20 MG capsule Take 20 mg by mouth daily.    [provider]  ondansetron (ZOFRAN ODT) 4 MG disintegrating tablet Take 1 tablet (4 mg total) by mouth every 8 (eight) hours as needed. 03/03/18   Levert Feinstein, MD  propranolol ER (INDERAL LA) 80 MG 24 hr capsule Take 80 mg by mouth daily. 10/07/17   [provider]  rizatriptan (MAXALT-MLT) 10 MG disintegrating tablet Take 1 tablet (10 mg total) by mouth as needed. May repeat in 2 hours if needed 03/03/18   Levert Feinstein, MD  topiramate (TOPAMAX) 100 MG tablet Take 1 tablet (100 mg total) by mouth 2 (two) times daily. 03/03/18   Levert Feinstein, MD  traZODone (DESYREL) 50 MG tablet Take 50 mg by mouth at bedtime.    [provider]     Family History Family History  Problem Relation Age of Onset  . Hypertension Mother   . Hypertension Father   . Breast cancer Cousin   . Ovarian cancer Paternal Grandmother     Social History Social History   Tobacco Use  . Smoking status: Former Smoker    Last attempt to quit: 11/23/2014    Years since quitting: 3.3  . Smokeless tobacco: Never Used  Substance Use Topics  . Alcohol use: Yes    Frequency: Never    Comment: Rare  . Drug use: No     Allergies   Patient has no known allergies.   Review of Systems Review of Systems  Constitutional: Negative for chills, diaphoresis, fatigue and fever.  HENT: Negative for congestion.   Eyes: Negative for pain and visual disturbance.  Respiratory: Negative for cough, chest tightness and shortness of breath.   Cardiovascular: Negative for chest pain and palpitations.  Gastrointestinal: Negative for abdominal pain and vomiting.  Genitourinary: Negative for dysuria, flank pain and frequency.  Musculoskeletal: Negative for arthralgias, back pain, neck pain and neck stiffness.  Skin: Negative for color change and rash.  Neurological: Negative for headaches.  Psychiatric/Behavioral: Negative for agitation.  All other systems reviewed and are negative.    Physical Exam Updated Vital Signs BP (!) 139/94 (BP Location: Right Arm)   Pulse 84   Temp 98.1 F (36.7 C) (Oral)   Resp 16   Ht 5\' 7"  (1.702 m)   Wt 129.3 kg   SpO2 99%   BMI 44.64 kg/m   Physical Exam  Constitutional: She is oriented to person, place, and time. She appears well-developed and well-nourished. No distress.  HENT:  Head: Normocephalic and atraumatic.  Mouth/Throat: Oropharynx is clear and moist.  Neck: Normal range of motion.  Cardiovascular: Normal rate and regular rhythm.  No murmur heard. Pulmonary/Chest: Effort normal and breath sounds normal. No respiratory distress. She has no wheezes. She has no rales.  Abdominal: Soft.   Musculoskeletal: She exhibits tenderness. She exhibits no edema.       Lumbar back: She exhibits tenderness, laceration and pain. She exhibits no swelling, no edema and no spasm.       Back:  Neurological: She is alert and oriented to person, place, and time. No sensory deficit. She exhibits normal muscle tone.  Skin: Skin is warm and dry. Capillary refill takes less than 2 seconds. No rash noted. She is not diaphoretic. No erythema.  Psychiatric: She has a normal mood and affect.  Nursing note and vitals reviewed.    ED Treatments / Results  Labs (all labs ordered are listed, but only abnormal results are displayed) Labs Reviewed - No data to display  EKG None  Radiology No results found.  Procedures Procedures (  including critical care time)  Medications Ordered in ED Medications - No data to display Bacitracin  Initial Impression / Assessment and Plan / ED Course  I have reviewed the triage vital signs and the nursing notes.  Pertinent labs & imaging results that were available during my care of the patient were reviewed by me and considered in my medical decision making (see chart for details).     Pernie Grosso is a 42 y.o. female with a past medical history significant for migraines, hypertension, asthma, and obesity who presents with buttock pain.  Patient reports that since yesterday she began having pain in her superior buttock area.  She reports that the pain is moderate.  She reports that there is a small "hole" where she was feeling.  She says she has had a boil in the past but has never had to have anything drained.  She denies any drainage from the site today.  She denies fevers, chills, nausea, vomiting, urinary symptoms or any constipation or diarrhea.  She denies any trauma.  She denies any other complaints.  On exam, patient has a 2 mm skin tear right on the fold of her buttock.  There is no surrounding erythema drainage or fluctuance.  No evidence of  infection whatsoever.  Patient has a very slight skin tear that is likely causing pain whenever she sits and spread to the area.  Exam otherwise unremarkable with clear lungs and nontender back or CVA areas.  Due to the small skin tear in this location, patient will have bacitracin applied to prevent infection.  No evidence of the tear going deep.  No evidence of infection or surrounding tenderness.  Patient will keep bacitracin on the site for several days and follow-up with her PCP.  Patient understood return precautions for any new or worsening symptoms.  No evidence of abscess to drain at this time.  Patient voiced understanding of the plan of care and return precautions.  Patient discharged in good condition.   Final Clinical Impressions(s) / ED Diagnoses   Final diagnoses:  Tear of skin of buttock, unspecified laterality, initial encounter    ED Discharge Orders    None      Clinical Impression: 1. Tear of skin of buttock, unspecified laterality, initial encounter     Disposition: Discharge  Condition: Good  I have discussed the results, Dx and Tx plan with the pt(& family if present). He/she/they expressed understanding and agree(s) with the plan. Discharge instructions discussed at great length. Strict return precautions discussed and pt &/or family have verbalized understanding of the instructions. No further questions at time of discharge.    New Prescriptions   No medications on file    Follow Up: Inez Catalina, MD [address] [place] [address] [phone]     [facility] [address] [id number] [facility] [address] [phone]       Tegeler, Canary Brim, MD 04/13/18 [phone]

## 2018-05-05 MED FILL — AJOVY 225 MG/1.5ML SOSY: 225 | 30 days supply | Qty: 2 | Fill #1

## 2018-05-06 ENCOUNTER — Other Ambulatory Visit: Payer: Self-pay

## 2018-05-06 ENCOUNTER — Ambulatory Visit (INDEPENDENT_AMBULATORY_CARE_PROVIDER_SITE_OTHER): Payer: 59 | Admitting: Internal Medicine

## 2018-05-06 VITALS — BP 116/67 | HR 74 | Temp 97.8°F | Ht 67.0 in | Wt 285.5 lb

## 2018-05-06 DIAGNOSIS — G8929 Other chronic pain: Secondary | ICD-10-CM

## 2018-05-06 DIAGNOSIS — F329 Major depressive disorder, single episode, unspecified: Secondary | ICD-10-CM | POA: Insufficient documentation

## 2018-05-06 DIAGNOSIS — F322 Major depressive disorder, single episode, severe without psychotic features: Secondary | ICD-10-CM

## 2018-05-06 DIAGNOSIS — G43909 Migraine, unspecified, not intractable, without status migrainosus: Secondary | ICD-10-CM

## 2018-05-06 DIAGNOSIS — M62838 Other muscle spasm: Secondary | ICD-10-CM | POA: Diagnosis not present

## 2018-05-06 DIAGNOSIS — F332 Major depressive disorder, recurrent severe without psychotic features: Secondary | ICD-10-CM

## 2018-05-06 DIAGNOSIS — F32A Depression, unspecified: Secondary | ICD-10-CM | POA: Insufficient documentation

## 2018-05-06 MED ORDER — CYCLOBENZAPRINE HCL 5 MG PO TABS: 5.0000 mg | ORAL_TABLET | Freq: Every day | ORAL | 0 refills | Status: DC

## 2018-05-06 MED FILL — CYCLOBENZAPRINE 5 MG TABLET: 5 | 15 days supply | Qty: 15 | Fill #0

## 2018-05-06 NOTE — Patient Instructions (Addendum)
Thank you for coming to the clinic today. It was a pleasure to see you.   For your back pain -  Start using voltaren gel or capsacin  You can use the flexeril on your worst days when the pain interferes with your ability to work  Continue to use the heat therapy  Continue to do neck stretches  You can use tylenol in addition to your Excedrin as long as you don't exceed 4000 mg in one day  Use this muscle relaxant only on your worse days   FOLLOW-UP INSTRUCTIONS When: 5 months with Dr. Criselda PeachesMullen  For: follow up of your general health  What to bring: all of your medication bottles   Please call the internal medicine center clinic if you have any questions or concerns, we may be able to help and keep you from a long and expensive emergency room wait. Our clinic and after hours phone number is [phone], the best time to call is Monday through Friday 9 am to 4 pm but there is always someone available 24/7 if you have an emergency. If you need medication refills please notify your pharmacy one week in advance and they will send us a request.

## 2018-05-06 NOTE — Assessment & Plan Note (Signed)
Screening PHQ 9 today is 18. This is managed by behavioral health outside of the internal medicine office. [name] recently went for her psychology evaluation and discussed her depression symptoms there. Her psychologist felt that she would benefit from some medication adjustments so [name] has a follow up scheduled with psychiatry in the near future. Unfortunately she forgot to bring her medication bottles with her today and is unsure of what she is prescribed but will try to remember at next office visit.

## 2018-05-06 NOTE — Progress Notes (Signed)
   CC: neck pain   HPI:  StaceyStacey Price is a 42 y.o. with PMH as listed below who presents for neck pain . Please see the assessment and plans for the status of the patient chronic medical problems.   Past Medical History:  Diagnosis Date  . Asthma   . Hypertension   . Migraines   . Obesity   . Peritonitis (HCC) 2014   Review of Systems:  Refer to history of present illness and assessment and plans for pertinent review of systems, all others reviewed and negative  Physical Exam:  Vitals:   05/06/18 1051  BP: 116/67  Pulse: 74  Temp: 97.8 F (36.6 C)  TempSrc: Oral  SpO2: 100%  Weight: 285 lb 8 oz (129.5 kg)  Height: 5\' 7"  (1.702 m)   General: well appearing, no distress  MSK: evident muscle spasm of the left upper trapezius, full range of motion of the neck is demonstrated, there is no cervical spine tenderness  Neuro: strength of proximal and distal upper extremity muscle groups are intact  Assessment & Plan:   Neck pain  Patient is here for acute on chronic neck pain evaluation. The pain began one week ago, it was proceeded by a migraine. This is the usual course for her for the past year, she usually gets pain in the neck after migraines. She has an aching throbbing pain in the left greater than right side of her neck. It is constant through the day not particularly worse in the morning. She has not had any recent trauma to the neck. She notes that there is some tingling in bilateral middle three fingers which is old and proceeded the neck pain. She has tried using a warm compress and this helped to relieve the pain. She has not tried any over the counter medications for the pain, she is worried that these could interact with the Excedrin that she takes for migraine.The pain has not interfered with her work.  Exam is consistent with trapezius muscle spasm. There is concern for cervical radiculopathy, she has not had xray evaluation yet.  - cervical xray today  - continue  heat and stretching therapy - start topical analgesics - voltaren gel or capsaicin  - short term prescription for flexeril, discussed the sedating side effects of this - provided work note for standing desk   Severe Uncontrolled depression  Screening PHQ 9 today is 18. This is managed by behavioral health outside of the internal medicine office. Stacey Price recently went for her psychology evaluation and discussed her depression symptoms there. Her psychologist felt that she would benefit from some medication adjustments so Stacey Price has a follow up scheduled with psychiatry in the near future. Unfortunately she forgot to bring her medication bottles with her today and is unsure of what she is prescribed but will try to remember at next office visit.    See Encounters Tab for problem based charting.  Patient discussed with Dr. Criselda PeachesMullen

## 2018-05-06 NOTE — Assessment & Plan Note (Signed)
Patient is here for acute on chronic neck pain evaluation. The pain began one week ago, it was proceeded by a migraine. This is the usual course for her for the past year, she usually gets pain in the neck after migraines. She has an aching throbbing pain in the left greater than right side of her neck. It is constant through the day not particularly worse in the morning. She has not had any recent trauma to the neck. She notes that there is some tingling in bilateral middle three fingers which is old and proceeded the neck pain. She has tried using a warm compress and this helped to relieve the pain. She has not tried any over the counter medications for the pain, she is worried that these could interact with the Excedrin that she takes for migraine.The pain has not interfered with her work.  Exam is consistent with trapezius muscle spasm. There is concern for cervical radiculopathy, she has not had xray evaluation yet.  - cervical xray today  - continue heat and stretching therapy - start topical analgesics - voltaren gel or capsaicin  - short term prescription for flexeril, discussed the sedating side effects of this - provided work note for standing desk

## 2018-05-11 NOTE — Progress Notes (Signed)
Internal Medicine Clinic Attending  Case discussed with Dr. Blum at the time of the visit.  We reviewed the resident's history and exam and pertinent patient test results.  I agree with the assessment, diagnosis, and plan of care documented in the resident's note. 

## 2018-05-26 ENCOUNTER — Ambulatory Visit: Payer: 59 | Admitting: Licensed Clinical Social Worker

## 2018-05-26 ENCOUNTER — Encounter: Payer: Self-pay | Admitting: Internal Medicine

## 2018-05-26 ENCOUNTER — Other Ambulatory Visit: Payer: Self-pay

## 2018-05-26 ENCOUNTER — Ambulatory Visit (INDEPENDENT_AMBULATORY_CARE_PROVIDER_SITE_OTHER): Payer: 59 | Admitting: Internal Medicine

## 2018-05-26 VITALS — BP 148/75 | HR 72 | Temp 98.4°F | Ht 66.0 in | Wt 283.3 lb

## 2018-05-26 DIAGNOSIS — F410 Panic disorder [episodic paroxysmal anxiety] without agoraphobia: Secondary | ICD-10-CM

## 2018-05-26 DIAGNOSIS — F329 Major depressive disorder, single episode, unspecified: Secondary | ICD-10-CM | POA: Diagnosis not present

## 2018-05-26 DIAGNOSIS — F419 Anxiety disorder, unspecified: Secondary | ICD-10-CM | POA: Diagnosis not present

## 2018-05-26 DIAGNOSIS — F332 Major depressive disorder, recurrent severe without psychotic features: Secondary | ICD-10-CM

## 2018-05-26 DIAGNOSIS — F322 Major depressive disorder, single episode, severe without psychotic features: Secondary | ICD-10-CM

## 2018-05-26 NOTE — Patient Instructions (Signed)
Thank you for allowing us to care for you  For your recurrent anxiety - We have referred you to our in office counselor while you work to find a new long term counselor - Follow up with your psychiatrist

## 2018-05-26 NOTE — Progress Notes (Signed)
   CC: Anxiety, Depression  HPI:  [name] is a 42 y.o. F with PMHx listed below presenting for Anxiety, Depression. Please see the A&P for the status of the patient's chronic medical problems.   Past Medical History:  Diagnosis Date  . Asthma   . Hypertension   . Migraines   . Obesity   . Peritonitis (HCC) 2014   Review of Systems:  Performed and all others negative.  Physical Exam:  Vitals:   05/26/18 1352  BP: (!) 148/75  Pulse: 72  Temp: 98.4 F (36.9 C)  TempSrc: Oral  SpO2: 100%  Weight: 283 lb 4.8 oz (128.5 kg)  Height: 5\' 6"  (1.676 m)   Physical Exam  Constitutional: She appears well-developed and well-nourished.  Mildly distressed and tearful  Cardiovascular: Normal rate, regular rhythm, normal heart sounds and intact distal pulses.  Pulmonary/Chest: Effort normal and breath sounds normal. No respiratory distress.  Abdominal: Soft. Bowel sounds are normal. She exhibits no distension. There is no tenderness.  Musculoskeletal: She exhibits no edema or deformity.  Skin: Skin is warm and dry.   Assessment & Plan:   See Encounters Tab for problem based charting.   Patient discussed with Dr. Rogelia BogaButcher

## 2018-05-26 NOTE — Assessment & Plan Note (Signed)
Patient experiencing worsened anxiety and depression in the setting of her psychologist quitting about 1 months ago and it being the birthday of her deceased son. [facility] while she finds a new psychologist and will be seen today. - [facility] referral - Follow up with psychiatry

## 2018-05-26 NOTE — Progress Notes (Signed)
Internal Medicine Clinic Attending  Case discussed with Dr. Melvin  at the time of the visit.  We reviewed the resident's history and exam and pertinent patient test results.  I agree with the assessment, diagnosis, and plan of care documented in the resident's note.  

## 2018-05-26 NOTE — Assessment & Plan Note (Addendum)
Patient presents reporting frequent panic attacks for the past several days. She has a long term history of anxiety and depression and has been following with a psychiatrist and a psychologist. She states her psychologist who she used to see twice a week recently quit and she has been unable to see someone for the past month. In addition, her son died in the past and today is his birthday adding to her emotional burden. Her sessions with psychology had been helping to control her symptoms. [facility] and ongoing while patient looks for a new psychologist. - [facility]

## 2018-05-27 NOTE — BH Specialist Note (Signed)
Integrated Behavioral Health Initial Visit  MRN: [id number] Name: Stacey Price  Number of Integrated Behavioral Health Clinician visits:: 1/6 Session Start time: 2:30  Session End time: 3:00 Total time: 30 minutes  Type of Service: Integrated Behavioral Health- Individual/Family Interpretor:No.     Warm Hand Off Completed. Yes, by Dr. Alinda Money for an elevated PHQ-9 score.      SUBJECTIVE: Stacey Price is a 42 y.o. female  whom attended the session individually.  Patient was referred by Dr.Melvin for an elevated PHQ-9 score/depression. Patient reports the following symptoms/concerns: continuing to grieve the loss of her son, depression, panic symptoms, anxiety, lack of social supports, and issues with headaches. Duration of problem: ongoing for many years, but has increased over the past month; Severity of problem: moderate  OBJECTIVE: Mood: Anxious and Depressed and Affect: Depressed and Tearful Risk of harm to self or others: Suicidal ideation (fleeting thoughts). Patient has no plan or intent. Patient reports having the fleeting SI over the past two weeks. Patient reported no history of self-harm.   LIFE CONTEXT: Family and Social: Patient lives with her partner. Patient has two adult children that do not live with her. Patient reported she lost her son when he was 89 years old due to a tragic accident. Patient also lost the father of her children one year after the death of her son. Patient is close to both of her parents, but they both live in another state.  School/Work: Patient is working, but is currently on a modified schedule due to headaches and anxiety according to the patient. Patient reported she has already exhausted three of the five allotted half days she has been provided by her PCP. Patient is concerned about her ability to complete a full day of work due to her anxiety.  Self-Care: Patient reported limited healthy coping skills. Life Changes: Today was the  anniversary of the patient's son's passing. Patient recently lost her counselor that she was seeing over the past year. Patient also reported she ran out of her sleep medication, but has an appointment with her psychiatrist tomorrow.   GOALS ADDRESSED: Patient will: 1. Reduce symptoms of: anxiety, depression, insomnia, stress and grief. 2. Increase knowledge and/or ability of: coping skills, healthy habits, stress reduction and to grieve the loss of her son.   3. Demonstrate ability to: Increase healthy adjustment to current life circumstances, Increase adequate support systems for patient/family and Begin healthy grieving over loss  INTERVENTIONS: Interventions utilized: Motivational Interviewing, Supportive Counseling, Medication Monitoring and Link to [employer]. Grief counseling was used to support the patient as she processed the loss of her son, and her son's father. MI was used to identify what healthy coping skills the patient could implement to reduce her panic symptoms. Options for counseling services were discussed with the patient. Therapist assessed for SI, HI, and self-harm due to an elevated PHQ-9. Therapist is recommending the patient be seen at least weekly.  Standardized Assessments completed: PHQ 9  ASSESSMENT: Patient currently experiencing increased depression and anxiety symptoms over the past month. Patient reported having anxiety daily, and panic attacks several days a week. Patient reported having difficulty forming close relationships with others due to a history of losing loved ones. Patient has fleeting SI, but not plan or means to harm herself. Patient was seeing a counselor regularly for one year, but recently lost her counselor. Patient reported since not receiving counseling her symptoms have increased, and made attending work more difficult for her. Patient plans to meet with  her psychiatrist tomorrow to review her medications.    Patient may benefit from  weekly outpatient therapy.  PLAN: 1. Follow up with behavioral health clinician on : one week is recommended.  2. Referral(s): [facility]   [place], [place], [place]

## 2018-06-03 ENCOUNTER — Ambulatory Visit (INDEPENDENT_AMBULATORY_CARE_PROVIDER_SITE_OTHER): Payer: 59 | Admitting: Neurology

## 2018-06-03 ENCOUNTER — Encounter: Payer: Self-pay | Admitting: Neurology

## 2018-06-03 VITALS — BP 154/94 | HR 82 | Ht 66.0 in | Wt 285.5 lb

## 2018-06-03 DIAGNOSIS — IMO0002 Reserved for concepts with insufficient information to code with codable children: Secondary | ICD-10-CM

## 2018-06-03 DIAGNOSIS — G43709 Chronic migraine without aura, not intractable, without status migrainosus: Secondary | ICD-10-CM

## 2018-06-03 NOTE — Patient Instructions (Addendum)
Daily preventive medications  Topamax 100 mg twice a day Propanolol ER 80 mg daily  Ajovy injection monthly  Plan to get headache:  Maxalt 10 mg dissolvable as needed, You may mix it together with Zofran for nausea, Aleve 1 to 2 tablets as needed for prolonged headaches.

## 2018-06-03 NOTE — Progress Notes (Signed)
PATIENT: Stacey Price DOB: 1976/02/19  Chief Complaint  Patient presents with  . Migraine    Reports improvement in migraines since adding Ajovy to her treatment regimen three months ago.  She has continued taking topiramate and propranolol.  She has gone from having headaches daily to twice weekly.  Rizatriptan is not very helpful when she has used it for her more severe pain.  She would like to try an alternate rescue medication.     HISTORICAL  [name] is a 42 years old female, seen in request by her primary care physician Dr. Debe Coder for evaluation of migraine, initial evaluation was on March 03, 2018.  I have reviewed and summarized the referring note from the referring physician, she has past medical history of hypertension,  She reported history of chronic migraine since middle school, her typical migraine are lateralized severe pounding headache with associated light noise sensitivity, nauseous, lasting for few hours, over the years, her headache become more frequent, for many years she has been taking Excedrin Migraine 2 tablets twice a day,  Since June 2019, from occasionally headache each month, she began to have every day headaches, she was started on propanolol LA 80 mg since June 2019, with limited help of her migraine, she was taking Topamax 25 mg twice a day, dosage was increased to 100 mg twice a day a week ago, but no significant decrease of her migraine headaches yet,  Abortive treatment, she has tried Imitrex with limited help, over time she has to combine it with Mobic, even go back to her daily Excedrin Migraine headaches again  UPDATE Jun 03 2018: She was able to successfully wean off Excedrin Migraine, started Ajovy since September 2019, continue Topamax 100 mg twice daily.  Inderal ER 80 mg daily, reported 80% improvement, instead of daily headaches, she has headaches couple times each week, usually short lasting, for longer lasting headache she  has tried Maxalt and Zofran as needed, which has been helpful,   REVIEW OF SYSTEMS: Full 14 system review of systems performed and notable only for frequent urination, headaches, numbness, depression, anxiety, low back pain, aching muscles, muscle cramps, neck pain, stiffness, frequent awakening daytime sleepiness, snoring, cough All other review of systems were negative.  ALLERGIES: No Known Allergies  HOME MEDICATIONS: Current Outpatient Medications  Medication Sig Dispense Refill  . albuterol (PROVENTIL HFA;VENTOLIN HFA) 108 (90 Base) MCG/ACT inhaler Inhale 1-2 puffs into the lungs every 6 (six) hours as needed for wheezing or shortness of breath. 18 g 6  . amLODipine (NORVASC) 5 MG tablet Take 1 tablet (5 mg total) by mouth daily. 90 tablet 1  . citalopram (CELEXA) 10 MG tablet Take 10 mg by mouth daily.  2  . cyclobenzaprine (FLEXERIL) 5 MG tablet Take 1 tablet (5 mg total) by mouth daily. 15 tablet 0  . diclofenac sodium (VOLTAREN) 1 % GEL Apply 2 g topically 4 (four) times daily. 1 Tube 0  . dicyclomine (BENTYL) 20 MG tablet Take 1 tablet (20 mg total) by mouth 3 times/day as needed-between meals & bedtime (Abdominal pain). 20 tablet 0  . Fluticasone-Salmeterol (ADVAIR) 250-50 MCG/DOSE AEPB Inhale 1 puff into the lungs 2 (two) times daily. 60 each 6  . Fremanezumab-vfrm (AJOVY) 225 MG/1.5ML SOSY Inject 225 mg into the skin every 30 (thirty) days. 1 Syringe 11  . meloxicam (MOBIC) 7.5 MG tablet Take 1 tablet (7.5 mg total) by mouth 2 (two) times daily as needed for pain. 30 tablet 1  .  omeprazole (PRILOSEC) 20 MG capsule Take 20 mg by mouth daily.    . ondansetron (ZOFRAN ODT) 4 MG disintegrating tablet Take 1 tablet (4 mg total) by mouth every 8 (eight) hours as needed. 20 tablet 6  . propranolol ER (INDERAL LA) 80 MG 24 hr capsule Take 80 mg by mouth daily.  1  . rizatriptan (MAXALT-MLT) 10 MG disintegrating tablet Take 1 tablet (10 mg total) by mouth as needed. May repeat in 2 hours  if needed 15 tablet 11  . topiramate (TOPAMAX) 100 MG tablet Take 1 tablet (100 mg total) by mouth 2 (two) times daily. 60 tablet 11  . traZODone (DESYREL) 50 MG tablet Take 50 mg by mouth at bedtime.    [name]. loratadine (CLARITIN) 10 MG tablet Take 1 tablet (10 mg total) by mouth daily as needed for allergies. 30 tablet 2   No current facility-administered medications for this visit.     PAST MEDICAL HISTORY: Past Medical History:  Diagnosis Date  . Asthma   . Hypertension   . Migraines   . Obesity   . Panic attacks   . Peritonitis (HCC) 2014    PAST SURGICAL HISTORY: Past Surgical History:  Procedure Laterality Date  . ABDOMINAL HYSTERECTOMY  2009   [facility]   . CESAREAN SECTION     1995-2000    FAMILY HISTORY: Family History  Problem Relation Age of Onset  . Hypertension Mother   . Hypertension Father   . Breast cancer Cousin   . Ovarian cancer Paternal Grandmother     SOCIAL HISTORY: Social History   Socioeconomic History  . Marital status: Single    Spouse name: Not on file  . Number of children: 2  . Years of education: 4014  . Highest education level: Not on file  Occupational History  . Occupation: collections and recovery agent  Social Needs  . Financial resource strain: Not on file  . Food insecurity:    Worry: Not on file    Inability: Not on file  . Transportation needs:    Medical: Not on file    Non-medical: Not on file  Tobacco Use  . Smoking status: Former Smoker    Last attempt to quit: 11/23/2014    Years since quitting: 3.5  . Smokeless tobacco: Never Used  Substance and Sexual Activity  . Alcohol use: Yes    Frequency: Never    Comment: Rare  . Drug use: No  . Sexual activity: Yes    Partners: Female    Birth control/protection: Surgical  Lifestyle  . Physical activity:    Days per week: Not on file    Minutes per session: Not on file  . Stress: Not on file  Relationships  . Social connections:    Talks on phone: Not on file     Gets together: Not on file    Attends religious service: Not on file    Active member of club or organization: Not on file    Attends meetings of clubs or organizations: Not on file    Relationship status: Not on file  . Intimate partner violence:    Fear of current or ex partner: Not on file    Emotionally abused: Not on file    Physically abused: Not on file    Forced sexual activity: Not on file  Other Topics Concern  . Not on file  Social History Narrative   Lives at home with a friend.   Right-handed.   1  cup caffeine some days.     PHYSICAL EXAM   Vitals:   06/03/18 1318  BP: (!) 154/94  Pulse: 82  Weight: 285 lb 8 oz (129.5 kg)  Height: 5\' 6"  (1.676 m)    Not recorded      Body mass index is 46.08 kg/m.  PHYSICAL EXAMNIATION:  Gen: NAD, conversant, well nourised, obese, well groomed                     Cardiovascular: Regular rate rhythm, no peripheral edema, warm, nontender. Eyes: Conjunctivae clear without exudates or hemorrhage Neck: Supple, no carotid bruits. Pulmonary: Clear to auscultation bilaterally   NEUROLOGICAL EXAM:  MENTAL STATUS: Speech:    Speech is normal; fluent and spontaneous with normal comprehension.  Cognition:     Orientation to time, place and person     Normal recent and remote memory     Normal Attention span and concentration     Normal Language, naming, repeating,spontaneous speech     Fund of knowledge   CRANIAL NERVES: CN II: Visual fields are full to confrontation. Fundoscopic exam is normal with sharp discs and no vascular changes. Pupils are round equal and briskly reactive to light. CN III, IV, VI: extraocular movement are normal. No ptosis. CN V: Facial sensation is intact to pinprick in all 3 divisions bilaterally. Corneal responses are intact.  CN VII: Face is symmetric with normal eye closure and smile. CN VIII: Hearing is normal to rubbing fingers CN IX, X: Palate elevates symmetrically. Phonation is  normal. CN XI: Head turning and shoulder shrug are intact CN XII: Tongue is midline with normal movements and no atrophy.  MOTOR: There is no pronator drift of out-stretched arms. Muscle bulk and tone are normal. Muscle strength is normal.  REFLEXES: Reflexes are 2+ and symmetric at the biceps, triceps, knees, and ankles. Plantar responses are flexor.  SENSORY: Intact to light touch, pinprick, positional sensation and vibratory sensation are intact in fingers and toes.  COORDINATION: Rapid alternating movements and fine finger movements are intact. There is no dysmetria on finger-to-nose and heel-knee-shin.    GAIT/STANCE: Posture is normal. Gait is steady with normal steps, base, arm swing, and turning. Heel and toe walking are normal. Tandem gait is normal.  Romberg is absent.   DIAGNOSTIC DATA (LABS, IMAGING, TESTING) - I reviewed patient records, labs, notes, testing and imaging myself where available.   ASSESSMENT AND PLAN  Kalesha Irving is a 42 y.o. female   Chronic migraine Medication rebound headaches  Keep preventive medication Topamax 100 mg twice a day, Inderal LA 80 mg daily  Ajovy 225mg  SQ monthly  Suboptimal response to Imitrex as needed, responded better to Maxalt 10 mg as needed, may mix together with Zofran,Aleve as needed  If she continues to show improvement with her headache(less than 4 times each months, well controlled by treatment NSAIDs treatment), may consider decrease her preventive medications   Levert Feinstein, M.D. Ph.D.  [facility] [address], [address] [place], [place] [address] Ph: [phone] Fax: [phone]  CC:  Inez Catalina, MD

## 2018-06-04 DIAGNOSIS — Z0289 Encounter for other administrative examinations: Secondary | ICD-10-CM

## 2018-06-10 ENCOUNTER — Ambulatory Visit: Payer: 59 | Admitting: Licensed Clinical Social Worker

## 2018-06-14 ENCOUNTER — Telehealth: Payer: Self-pay | Admitting: Licensed Clinical Social Worker

## 2018-06-14 NOTE — Telephone Encounter (Signed)
Patient was contacted to offer a future appointment. Patient did not answer, and a voicemail was left for the patient to contact our office.

## 2018-07-22 ENCOUNTER — Telehealth: Payer: Self-pay | Admitting: *Deleted

## 2018-07-22 NOTE — Telephone Encounter (Signed)
PA for Ajovy approved through 07/23/2019.

## 2018-07-22 NOTE — Telephone Encounter (Signed)
PA for Ajovy started via covermymeds (key: [id number]).  The patient has coverage with [employer].  Member VV#[id number].  Decision pending.

## 2018-07-23 MED FILL — AJOVY 225 MG/1.5ML SOSY: 225 | 30 days supply | Qty: 2 | Fill #2

## 2018-09-15 ENCOUNTER — Ambulatory Visit: Payer: 59 | Admitting: Internal Medicine

## 2018-10-05 ENCOUNTER — Ambulatory Visit (INDEPENDENT_AMBULATORY_CARE_PROVIDER_SITE_OTHER): Payer: BLUE CROSS/BLUE SHIELD | Admitting: Neurology

## 2018-10-05 ENCOUNTER — Encounter: Payer: Self-pay | Admitting: Neurology

## 2018-10-05 ENCOUNTER — Other Ambulatory Visit: Payer: Self-pay

## 2018-10-05 DIAGNOSIS — G43709 Chronic migraine without aura, not intractable, without status migrainosus: Secondary | ICD-10-CM

## 2018-10-05 DIAGNOSIS — IMO0002 Reserved for concepts with insufficient information to code with codable children: Secondary | ICD-10-CM

## 2018-10-05 MED ORDER — RIZATRIPTAN BENZOATE 10 MG PO TBDP: 10.0000 mg | ORAL_TABLET | ORAL | 11 refills | Status: DC | PRN

## 2018-10-05 MED ORDER — FREMANEZUMAB-VFRM 225 MG/1.5ML ~~LOC~~ SOSY: 225.0000 mg | PREFILLED_SYRINGE | SUBCUTANEOUS | 4 refills | Status: DC

## 2018-10-05 MED ORDER — TOPIRAMATE 100 MG PO TABS: 100.0000 mg | ORAL_TABLET | Freq: Two times a day (BID) | ORAL | 4 refills | Status: DC

## 2018-10-05 MED ORDER — PROPRANOLOL HCL ER 80 MG PO CP24: 80.0000 mg | ORAL_CAPSULE | Freq: Every day | ORAL | 4 refills | Status: DC

## 2018-10-05 MED ORDER — CYCLOBENZAPRINE HCL 5 MG PO TABS: 5.0000 mg | ORAL_TABLET | ORAL | 6 refills | Status: DC | PRN

## 2018-10-05 MED ORDER — ONDANSETRON 4 MG PO TBDP: 4.0000 mg | ORAL_TABLET | Freq: Three times a day (TID) | ORAL | 6 refills | Status: DC | PRN

## 2018-10-05 NOTE — Progress Notes (Signed)
PATIENT: Stacey Price DOB: [birth year]  Virtual Visit via Video  I connected with Stacey Price on 10/05/18 at  By video and verified that I am speaking with the correct person using two identifiers.   I discussed the limitations, risks, security and privacy concerns of performing an evaluation and management service by video and the availability of in person appointments. I also discussed with the patient that there may be a patient responsible charge related to this service. The patient expressed understanding and agreed to proceed.  HISTORICAL    Stacey Price is a 43 years old female, seen in request by her primary care physician Dr. Debe Coder for evaluation of migraine, initial evaluation was on March 03, 2018.  I have reviewed and summarized the referring note from the referring physician, she has past medical history of hypertension,  She reported history of chronic migraine since middle school, her typical migraine are lateralized severe pounding headache with associated light noise sensitivity, nauseous, lasting for few hours, over the years, her headache become more frequent, for many years she has been taking Excedrin Migraine 2 tablets twice a day,  Since June 2019, from occasionally headache each month, she began to have every day headaches, she was started on propanolol LA 80 mg since June 2019, with limited help of her migraine, she was taking Topamax 25 mg twice a day, dosage was increased to 100 mg twice a day a week ago, but no significant decrease of her migraine headaches yet,  Abortive treatment, she has tried Imitrex with limited help, over time she has to combine it with Mobic, even go back to her daily Excedrin Migraine headaches again  UPDATE Jun 03 2018: She was able to successfully wean off Excedrin Migraine, started Ajovy since September 2019, continue Topamax 100 mg twice daily.  Inderal ER 80 mg daily, reported 80% improvement, instead of daily  headaches, she has headaches couple times each week, usually short lasting, for longer lasting headache she has tried Maxalt and Zofran as needed, which has been helpful,  UPDATE October 05 2018: She is overall doing very well, Ajovy every month has made a difference in her migraine control, she has migraine only at the end of month, is much better controlled with Maxalt as needed, she is also taking Topamax 100 mg twice a day, propanolol LA 80 mg daily for migraine control   Observations/Objective: I have reviewed problem lists, medications, allergies.  Assessment and Plan: Chronic migraine headaches  Improved with current Ajovy, Topamax 100 twice a day, propanolol LA 80 mg daily, refilled her prescription  Maxalt as needed, may mix together with Zofran as needed, Flexeril as needed  Follow Up Instructions:   6 months with nurse practitioner Maralyn Sago   I discussed the assessment and treatment plan with the patient. The patient was provided an opportunity to ask questions and all were answered. The patient agreed with the plan and demonstrated an understanding of the instructions.   The patient was advised to call back or seek an in-person evaluation if the symptoms worsen or if the condition fails to improve as anticipated.  I provided 20 minutes of non-face-to-face time during this encounter.  REVIEW OF SYSTEMS: Full 14 system review of systems performed and notable only for  as above All other review of systems were negative.  ALLERGIES: No Known Allergies  HOME MEDICATIONS: Current Outpatient Medications  Medication Sig Dispense Refill   albuterol (PROVENTIL HFA;VENTOLIN HFA) 108 (90 Base) MCG/ACT inhaler Inhale 1-2 puffs  into the lungs every 6 (six) hours as needed for wheezing or shortness of breath. 18 g 6   amLODipine (NORVASC) 5 MG tablet Take 1 tablet (5 mg total) by mouth daily. 90 tablet 1   citalopram (CELEXA) 10 MG tablet Take 10 mg by mouth daily.  2   cyclobenzaprine  (FLEXERIL) 5 MG tablet Take 1 tablet (5 mg total) by mouth daily. 15 tablet 0   diclofenac sodium (VOLTAREN) 1 % GEL Apply 2 g topically 4 (four) times daily. 1 Tube 0   dicyclomine (BENTYL) 20 MG tablet Take 1 tablet (20 mg total) by mouth 3 times/day as needed-between meals & bedtime (Abdominal pain). 20 tablet 0   Fluticasone-Salmeterol (ADVAIR) 250-50 MCG/DOSE AEPB Inhale 1 puff into the lungs 2 (two) times daily. 60 each 6   Fremanezumab-vfrm (AJOVY) 225 MG/1.5ML SOSY Inject 225 mg into the skin every 30 (thirty) days. 1 Syringe 11   loratadine (CLARITIN) 10 MG tablet Take 1 tablet (10 mg total) by mouth daily as needed for allergies. 30 tablet 2   meloxicam (MOBIC) 7.5 MG tablet Take 1 tablet (7.5 mg total) by mouth 2 (two) times daily as needed for pain. 30 tablet 1   omeprazole (PRILOSEC) 20 MG capsule Take 20 mg by mouth daily.     ondansetron (ZOFRAN ODT) 4 MG disintegrating tablet Take 1 tablet (4 mg total) by mouth every 8 (eight) hours as needed. 20 tablet 6   propranolol ER (INDERAL LA) 80 MG 24 hr capsule Take 80 mg by mouth daily.  1   rizatriptan (MAXALT-MLT) 10 MG disintegrating tablet Take 1 tablet (10 mg total) by mouth as needed. May repeat in 2 hours if needed 15 tablet 11   topiramate (TOPAMAX) 100 MG tablet Take 1 tablet (100 mg total) by mouth 2 (two) times daily. 60 tablet 11   traZODone (DESYREL) 50 MG tablet Take 50 mg by mouth at bedtime.     No current facility-administered medications for this visit.     PAST MEDICAL HISTORY: Past Medical History:  Diagnosis Date   Asthma    Hypertension    Migraines    Obesity    Panic attacks    Peritonitis (HCC) 2014    PAST SURGICAL HISTORY: Past Surgical History:  Procedure Laterality Date   ABDOMINAL HYSTERECTOMY  2009   Parcial    CESAREAN SECTION     1995-2000    FAMILY HISTORY: Family History  Problem Relation Age of Onset   Hypertension Mother    Hypertension Father    Breast  cancer Cousin    Ovarian cancer Paternal Grandmother     SOCIAL HISTORY:   Social History   Socioeconomic History   Marital status: Single    Spouse name: Not on file   Number of children: 2   Years of education: 14   Highest education level: Not on file  Occupational History   Occupation: collections and recovery agent  Social Needs   Financial resource strain: Not on file   Food insecurity:    Worry: Not on file    Inability: Not on file   Transportation needs:    Medical: Not on file    Non-medical: Not on file  Tobacco Use   Smoking status: Former Smoker    Last attempt to quit: 11/23/2014    Years since quitting: 3.8   Smokeless tobacco: Never Used  Substance and Sexual Activity   Alcohol use: Yes    Frequency: Never  Comment: Rare   Drug use: No   Sexual activity: Yes    Partners: Female    Birth control/protection: Surgical  Lifestyle   Physical activity:    Days per week: Not on file    Minutes per session: Not on file   Stress: Not on file  Relationships   Social connections:    Talks on phone: Not on file    Gets together: Not on file    Attends religious service: Not on file    Active member of club or organization: Not on file    Attends meetings of clubs or organizations: Not on file    Relationship status: Not on file   Intimate partner violence:    Fear of current or ex partner: Not on file    Emotionally abused: Not on file    Physically abused: Not on file    Forced sexual activity: Not on file  Other Topics Concern   Not on file  Social History Narrative   Lives at home with a friend.   Right-handed.   1 cup caffeine some days.    Levert FeinsteinYijun Myley Bahner, M.D. Ph.D.  [facility] [address], [address] [place], [place] [address] Ph: [phone] Fax: [phone]  CC: Referring Provider

## 2018-11-02 DIAGNOSIS — F331 Major depressive disorder, recurrent, moderate: Secondary | ICD-10-CM | POA: Diagnosis not present

## 2018-11-02 DIAGNOSIS — F1020 Alcohol dependence, uncomplicated: Secondary | ICD-10-CM | POA: Diagnosis not present

## 2018-11-02 DIAGNOSIS — F4310 Post-traumatic stress disorder, unspecified: Secondary | ICD-10-CM | POA: Diagnosis not present

## 2018-11-03 ENCOUNTER — Telehealth: Payer: Self-pay

## 2018-11-03 NOTE — Telephone Encounter (Signed)
Unable to get in contact with the patient to schedule her 6 month follow up with Sarah per Dr. Terrace Arabia. I left a voicemail asking her return my call. Office number was provided.   If patient calls back please schedule her 6 month follow up with Maralyn Sago.

## 2018-11-11 NOTE — Telephone Encounter (Signed)
LMVM for pt to return call for scheduling 6 month f/u with SS/NP (oct 2020).  May make appt when she calls back.

## 2018-11-16 MED FILL — CITALOPRAM HBR 10 MG TABLET: 10 | 90 days supply | Qty: 90 | Fill #0

## 2018-11-16 MED FILL — traZODone HCL 50 MG TABS: 50 | 90 days supply | Qty: 90 | Fill #0

## 2018-11-24 ENCOUNTER — Encounter: Payer: Self-pay | Admitting: Internal Medicine

## 2018-11-24 ENCOUNTER — Other Ambulatory Visit: Payer: Self-pay

## 2018-11-24 ENCOUNTER — Ambulatory Visit (INDEPENDENT_AMBULATORY_CARE_PROVIDER_SITE_OTHER): Payer: Self-pay | Admitting: Internal Medicine

## 2018-11-24 DIAGNOSIS — J4520 Mild intermittent asthma, uncomplicated: Secondary | ICD-10-CM

## 2018-11-24 DIAGNOSIS — F4310 Post-traumatic stress disorder, unspecified: Secondary | ICD-10-CM

## 2018-11-24 DIAGNOSIS — J45909 Unspecified asthma, uncomplicated: Secondary | ICD-10-CM

## 2018-11-24 DIAGNOSIS — Z79899 Other long term (current) drug therapy: Secondary | ICD-10-CM

## 2018-11-24 DIAGNOSIS — J309 Allergic rhinitis, unspecified: Secondary | ICD-10-CM

## 2018-11-24 DIAGNOSIS — IMO0002 Reserved for concepts with insufficient information to code with codable children: Secondary | ICD-10-CM

## 2018-11-24 DIAGNOSIS — F419 Anxiety disorder, unspecified: Secondary | ICD-10-CM

## 2018-11-24 DIAGNOSIS — Z6839 Body mass index (BMI) 39.0-39.9, adult: Secondary | ICD-10-CM

## 2018-11-24 DIAGNOSIS — I10 Essential (primary) hypertension: Secondary | ICD-10-CM

## 2018-11-24 DIAGNOSIS — N644 Mastodynia: Secondary | ICD-10-CM

## 2018-11-24 DIAGNOSIS — E669 Obesity, unspecified: Secondary | ICD-10-CM

## 2018-11-24 DIAGNOSIS — Z87891 Personal history of nicotine dependence: Secondary | ICD-10-CM

## 2018-11-24 DIAGNOSIS — R002 Palpitations: Secondary | ICD-10-CM

## 2018-11-24 DIAGNOSIS — G43909 Migraine, unspecified, not intractable, without status migrainosus: Secondary | ICD-10-CM

## 2018-11-24 DIAGNOSIS — F331 Major depressive disorder, recurrent, moderate: Secondary | ICD-10-CM | POA: Insufficient documentation

## 2018-11-24 DIAGNOSIS — G43709 Chronic migraine without aura, not intractable, without status migrainosus: Secondary | ICD-10-CM

## 2018-11-24 DIAGNOSIS — G8929 Other chronic pain: Secondary | ICD-10-CM

## 2018-11-24 MED ORDER — DICLOFENAC SODIUM 1 % TD GEL: 2.0000 g | Freq: Four times a day (QID) | TRANSDERMAL | 3 refills | Status: DC

## 2018-11-24 MED ORDER — LORATADINE 10 MG PO TABS: 10.0000 mg | ORAL_TABLET | Freq: Every day | ORAL | 2 refills | Status: DC | PRN

## 2018-11-24 NOTE — Assessment & Plan Note (Signed)
She is following with Dr. Terrace Arabia.  She gets injections of Ajovy which is working well.  She also remains on the propranolol which is also used for palpitations, and the Topiramate.  She notes that she only gets migraines now when she is close to due for another Ajovy injection.    Plan Continue current therapy and follow up with Neurology.

## 2018-11-24 NOTE — Assessment & Plan Note (Signed)
BP is well controlled today.  She will continue amlodipine.  She has no chest pain or dizziness.   Plan Continue amlodipine.

## 2018-11-24 NOTE — Assessment & Plan Note (Signed)
She is working to lose weight and today her weight is down to 253 and BMI down to 39.72.  I congratulated her on her success!
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
## 2018-11-24 NOTE — Patient Instructions (Signed)
Thank you for coming in today.  Your blood pressure is doing well.  Please continue to take your amlodipine as you have been.   Please call if your abdominal pain worsens or becomes constant.  This pain may be due to weight loss.    Thank you!  Come back to see me in 3-6 months, sooner if you need.

## 2018-11-24 NOTE — Assessment & Plan Note (Signed)
Well controlled on claritin.   Plan Refill loratadine.

## 2018-11-24 NOTE — Assessment & Plan Note (Signed)
Currently well controlled. She notes that she only takes the albuterol as needed and she cannot remember when she last took it.  She is taking her Advair daily.   Plan Continue current therapy.

## 2018-11-24 NOTE — Assessment & Plan Note (Signed)
Chronic.  Continues to have issues due to ligament pain.  Improved with voltaren gel.   Plan Refill Voltaren gel.

## 2018-11-24 NOTE — Assessment & Plan Note (Signed)
She is now following with [facility] and seeing a new therapist and her normal psychiatrist.  She has had a lot of psychosocial stressors over the last month and needs some paperwork filled out, which will be done.  She is taking Trazodone and citalopram without issue.   Continue current therapy and follow up with [facility].

## 2018-12-08 ENCOUNTER — Telehealth: Payer: Self-pay

## 2018-12-08 NOTE — Telephone Encounter (Signed)
Requesting to speak with a nurse about getting a letter for work. Please call pt back.  

## 2018-12-08 NOTE — Telephone Encounter (Signed)
Spoke w/ pt informed her that dr Daryll Drown would need to review records from therapist, need for a ROI, gave her fax# and she will sign ROI at therapist.

## 2018-12-08 NOTE — Telephone Encounter (Signed)
I will need notes from her therapist to review.  This seems excessive, and would need confirmation from her other health care provider.    Thanks!  She will likely need to sign a release of records to me.    Gilles Chiquito, MD

## 2018-12-08 NOTE — Telephone Encounter (Signed)
Pt calls and states her therapist told her she needs to be out of work for 30 days for mental rest. Her work requires a pcp to provide a LOA letter not the therapist. Could you write her an out of work letter for this starting 12/08/2018. Please address to: [place] PH# [phone] will need to call for fax#

## 2018-12-09 ENCOUNTER — Telehealth: Payer: Self-pay | Admitting: *Deleted

## 2018-12-09 NOTE — Telephone Encounter (Signed)
Thank you!  I will work on that tomorrow!

## 2018-12-09 NOTE — Telephone Encounter (Signed)
[place] therapist is faxing notes from sessions and recommendations for leave now for dr mullen's review and letter for LOA

## 2018-12-23 ENCOUNTER — Other Ambulatory Visit: Payer: Self-pay | Admitting: Internal Medicine

## 2018-12-23 DIAGNOSIS — Z1231 Encounter for screening mammogram for malignant neoplasm of breast: Secondary | ICD-10-CM

## 2019-02-01 ENCOUNTER — Ambulatory Visit: Payer: Medicaid Other

## 2019-02-23 ENCOUNTER — Other Ambulatory Visit: Payer: Self-pay

## 2019-02-23 ENCOUNTER — Encounter: Payer: Self-pay | Admitting: Cardiology

## 2019-02-23 ENCOUNTER — Ambulatory Visit (INDEPENDENT_AMBULATORY_CARE_PROVIDER_SITE_OTHER): Payer: BC Managed Care – PPO | Admitting: Cardiology

## 2019-02-23 ENCOUNTER — Ambulatory Visit: Payer: BC Managed Care – PPO

## 2019-02-23 VITALS — BP 150/91 | HR 59 | Ht 66.0 in | Wt 255.0 lb

## 2019-02-23 DIAGNOSIS — R002 Palpitations: Secondary | ICD-10-CM

## 2019-02-23 DIAGNOSIS — F331 Major depressive disorder, recurrent, moderate: Secondary | ICD-10-CM

## 2019-02-23 DIAGNOSIS — I10 Essential (primary) hypertension: Secondary | ICD-10-CM | POA: Diagnosis not present

## 2019-02-23 DIAGNOSIS — F419 Anxiety disorder, unspecified: Secondary | ICD-10-CM

## 2019-02-23 MED ORDER — LOSARTAN POTASSIUM 25 MG PO TABS: 25.0000 mg | ORAL_TABLET | Freq: Every day | ORAL | 1 refills | Status: DC

## 2019-02-23 NOTE — Progress Notes (Signed)
Primary Physician:  Inez CatalinaMullen, Emily B, MD   Patient ID: Stacey Price, female    DOB: 05-08-1976, 43 y.o.   MRN: [id number]  Subjective:    Chief Complaint  Patient presents with  . Palpitations  . Panic Attacks  . Follow-up    HPI: Stacey Price  is a 43 y.o. 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  She had undergone echocardiogram and also a routine treadmill stress test in March 2019 which is essentially nonischemic and echo was normal. She had improvement in palpitations with Inderal.   She now presents for follow up for worsening palpitations vs. Anxiety attacks. She states that over the last 1-2 months she has had panic attacks at work that are almost occurring daily. She has associated heart racing and shortness of breath. She is unsure if this is related to palpitations or anxiety. Symptoms resolve with taking deep breaths and trying to calm herself down. She has been advised by her job to potentially cut back to part time as she has having a hard time dealing with the stress of her job and her episodes. She has had lots of deaths recently in her family and feels that this may be contributing to her anxiety. She was previously seeing a Veterinary surgeoncounselor; however, she was advised that she will need to psychiatrist. She denies any chest pain or dyspnea on exertion. She is walking or riding stationary bicycle several days a week and tolerates this well.   Past Medical History:  Diagnosis Date  . Asthma   . Hypertension   . Migraines   . Obesity   . Panic attacks   . Peritonitis (HCC) 2014    Past Surgical History:  Procedure Laterality Date  . ABDOMINAL HYSTERECTOMY  2009   [facility]   . CESAREAN SECTION     1995-2000    Social History   Socioeconomic History  . Marital status: Single    Spouse name: Not on file  . Number of children: 2  . Years of education: 7714  .  Highest education level: Not on file  Occupational History  . Occupation: collections and recovery agent  Social Needs  . Financial resource strain: Not on file  . Food insecurity    Worry: Not on file    Inability: Not on file  . Transportation needs    Medical: Not on file    Non-medical: Not on file  Tobacco Use  . Smoking status: Former Smoker    Packs/day: 1.00    Years: 7.00    Pack years: 7.00    Quit date: 11/23/2014    Years since quitting: 4.2  . Smokeless tobacco: Never Used  Substance and Sexual Activity  . Alcohol use: Yes    Frequency: Never    Comment: Rare  . Drug use: No  . Sexual activity: Yes    Partners: Female    Birth control/protection: Surgical  Lifestyle  . Physical activity    Days per week: Not on file    Minutes per session: Not on file  . Stress: Not on file  Relationships  . Social Musicianconnections    Talks on phone: Not on file    Gets together: Not on file    Attends religious service: Not on file    Active member of club or organization: Not on file    Attends meetings of clubs or organizations:  Not on file    Relationship status: Not on file  . Intimate partner violence    Fear of current or ex partner: Not on file    Emotionally abused: Not on file    Physically abused: Not on file    Forced sexual activity: Not on file  Other Topics Concern  . Not on file  Social History Narrative   Lives at home with a friend.   Right-handed.   1 cup caffeine some days.    Review of Systems  Constitution: Negative for decreased appetite, malaise/fatigue, weight gain and weight loss.  Eyes: Negative for visual disturbance.  Cardiovascular: Positive for palpitations. Negative for chest pain, claudication, dyspnea on exertion, leg swelling, orthopnea and syncope.  Respiratory: Negative for hemoptysis and wheezing.   Endocrine: Negative for cold intolerance and heat intolerance.  Hematologic/Lymphatic: Does not bruise/bleed easily.  Skin: Negative  for nail changes.  Musculoskeletal: Negative for muscle weakness and myalgias.  Gastrointestinal: Negative for abdominal pain, change in bowel habit, nausea and vomiting.  Neurological: Negative for difficulty with concentration, dizziness, focal weakness and headaches.  Psychiatric/Behavioral: Positive for depression. Negative for altered mental status and suicidal ideas. The patient is nervous/anxious.   All other systems reviewed and are negative.     Objective:  Blood pressure (!) 150/91, pulse (!) 59, height 5\' 6"  (1.676 m), weight 255 lb (115.7 kg), SpO2 100 %. Body mass index is 41.16 kg/m.    Physical Exam  Constitutional: She is oriented to person, place, and time. Vital signs are normal. She appears well-developed and well-nourished.  HENT:  Head: Normocephalic and atraumatic.  Neck: Normal range of motion.  Cardiovascular: Normal rate, regular rhythm, normal heart sounds and intact distal pulses.  Pulmonary/Chest: Effort normal and breath sounds normal. No accessory muscle usage. No respiratory distress.  Abdominal: Soft. Bowel sounds are normal.  Musculoskeletal: Normal range of motion.  Neurological: She is alert and oriented to person, place, and time.  Skin: Skin is warm and dry.  Vitals reviewed.  Radiology: No results found.  Laboratory examination:    CMP Latest Ref Rng & Units 12/11/2017 08/28/2017 05/13/2017  Glucose 65 - 99 mg/dL 94 - 89  BUN 6 - 20 mg/dL 7 - 10  Creatinine 9.47 - 1.00 mg/dL 0.76 - 1.51  Sodium 834 - 145 mmol/L 138 - 140  Potassium 3.5 - 5.1 mmol/L 3.9 - 4.0  Chloride 101 - 111 mmol/L 109 - 107(H)  CO2 22 - 32 mmol/L 23 - 22  Calcium 8.9 - 10.3 mg/dL 3.7(D) - 8.6(L)  Total Protein 6.5 - 8.1 g/dL 7.2 7.1 -  Total Bilirubin 0.3 - 1.2 mg/dL 1.2 - -  Alkaline Phos 38 - 126 U/L 51 - -  AST 15 - 41 U/L 27 - -  ALT 14 - 54 U/L 20 - -   CBC Latest Ref Rng & Units 12/11/2017 05/06/2017 02/09/2017  WBC 4.0 - 10.5 K/uL 6.5 6.7 7.3  Hemoglobin 12.0  - 15.0 g/dL 57.8 97.8 47.8  Hematocrit 36.0 - 46.0 % 38.8 38.8 38.6  Platelets 150 - 400 K/uL 205 214 241   Lipid Panel  No results found for: CHOL, TRIG, HDL, CHOLHDL, VLDL, LDLCALC, LDLDIRECT HEMOGLOBIN A1C No results found for: HGBA1C, MPG TSH No results for input(s): TSH in the last 8760 hours.  PRN Meds:. Medications Discontinued During This Encounter  Medication Reason  . ondansetron (ZOFRAN ODT) 4 MG disintegrating tablet No longer needed (for PRN medications)   Current Meds  Medication Sig  . albuterol (PROVENTIL HFA;VENTOLIN HFA) 108 (90 Base) MCG/ACT inhaler Inhale 1-2 puffs into the lungs every 6 (six) hours as needed for wheezing or shortness of breath.  [name]. amLODipine (NORVASC) 5 MG tablet Take 1 tablet (5 mg total) by mouth daily.  . citalopram (CELEXA) 10 MG tablet Take 10 mg by mouth daily.  . cyclobenzaprine (FLEXERIL) 5 MG tablet Take 1 tablet (5 mg total) by mouth as needed for muscle spasms.  . diclofenac sodium (VOLTAREN) 1 % GEL Apply 2 g topically 4 (four) times daily.  [name]. dicyclomine (BENTYL) 20 MG tablet Take 1 tablet (20 mg total) by mouth 3 times/day as needed-between meals & bedtime (Abdominal pain).  . Fluticasone-Salmeterol (ADVAIR) 250-50 MCG/DOSE AEPB Inhale 1 puff into the lungs 2 (two) times daily.  . Fremanezumab-vfrm (AJOVY) 225 MG/1.5ML SOSY Inject 225 mg into the skin every 30 (thirty) days.  [name]. loratadine (CLARITIN) 10 MG tablet Take 1 tablet (10 mg total) by mouth daily as needed for allergies.  . meloxicam (MOBIC) 7.5 MG tablet Take 1 tablet (7.5 mg total) by mouth 2 (two) times daily as needed for pain.  [name]. omeprazole (PRILOSEC) 20 MG capsule Take 20 mg by mouth daily.  . propranolol ER (INDERAL LA) 80 MG 24 hr capsule Take 1 capsule (80 mg total) by mouth daily. (Patient taking differently: Take 100 mg by mouth daily. )  . rizatriptan (MAXALT-MLT) 10 MG disintegrating tablet Take 1 tablet (10 mg total) by mouth as needed. May repeat in 2 hours if  needed  . topiramate (TOPAMAX) 100 MG tablet Take 1 tablet (100 mg total) by mouth 2 (two) times daily.  . traZODone (DESYREL) 50 MG tablet Take 50 mg by mouth at bedtime.  . [DISCONTINUED] ondansetron (ZOFRAN ODT) 4 MG disintegrating tablet Take 1 tablet (4 mg total) by mouth every 8 (eight) hours as needed.    Cardiac Studies:   Echocardiogram 09/16/2017: Left ventricle cavity is normal in size. Mild concentric hypertrophy of the left ventricle. Normal global wall motion. Normal diastolic filling pattern. Calculated EF 61%. Left atrial cavity is mildly dilated. Mild (Grade I) mitral regurgitation. Mild tricuspid regurgitation. Estimated pulmonary artery systolic pressure 24 mmHg.  Holter monitor 48 hours 02//2019: Total of 48 ectopic ventricular beats, rare PACs, one episode of ventricle duplex at the rate of 100 bpm, no other complex arrhythmias.  Exercise Treadmill Stress Test 09/04/2017: Indication: Palpitations The patient exercised on Bruce protocol for 04:46 min. Patient achieved 6.73 METS and reached HR 154 bpm, which is 87 % of maximum age-predicted HR. Stress test terminated due to chest pain and SOB.  Exercise capacity was below average for age.  HR Response to Exercise: Appropriate. BP Response to Exercise: Resting hypertension- exaggerated response, max BP 210/90 mmHg Chest Pain: limiting. Arrhythmias: none. ST Changes: With peak exercise there was no ST-T changes of ischemia.  Overall Impression: Normal EKG stress test, although with poor exercise capacity and exersice limiting chest pain. Clinical correlation recommended.  Assessment:   Palpitations - Plan: EKG 12-Lead, Holter monitor - 48 hour  Essential hypertension - Plan: Basic metabolic panel  Obesity, Class III, BMI 40-49.9 (morbid obesity) (HCC)  Anxiety  Moderate episode of recurrent major depressive disorder (HCC)  EKG 02/23/2019: Sinus bradycardia at 55 bpm, normal axis, no evidence of ischemia.    Recommendations:   Seen today for worsening palpitations versus panic attacks.  She has over the last 1 to 2 months had worsening episodes of palpitations that are  now occurring daily.  Has associated shortness of breath and occasional lightheadedness that feel different than her previous episodes of palpitations.  She has previously had PACs and PVCs on event monitor.  I will place her on 48-hour Holter monitor to exclude any SVT.  I do feel that her anxiety and depression are likely the culprit and contributing to her symptoms.  She is needing to establish with psychiatry, I will make a referral for this to help with evaluation and management of her anxiety.  She has had several deaths in her family related to [place] and is having difficulty with coping with this.  I have encouraged her to continue with regular exercise, meditation, and yoga to help with coping with stress.  Unfortunately despite regular exercise, she has not lost weight.  Encouraged her to make diet changes to help with this.  Her blood pressure is markedly elevated today, I will start her on losartan 25 mg daily.  Will check BMP in 2 weeks for surveillance of kidney function.  I will plan to see her back in 4 weeks for follow-up.   Miquel Dunn, MSN, APRN, FNP-C [facility]. [place] Office: [phone] Fax: [phone]

## 2019-02-25 ENCOUNTER — Telehealth: Payer: Self-pay

## 2019-02-25 ENCOUNTER — Encounter: Payer: Self-pay | Admitting: Cardiology

## 2019-02-25 MED ORDER — OLMESARTAN MEDOXOMIL 20 MG PO TABS: 20.0000 mg | ORAL_TABLET | Freq: Every day | ORAL | 1 refills | Status: DC

## 2019-02-25 MED FILL — OLMESARTAN MEDOXOMIL 20 MG: 20 | 30 days supply | Qty: 30 | Fill #0

## 2019-02-25 NOTE — Telephone Encounter (Signed)
Lvm pt aware

## 2019-02-25 NOTE — Telephone Encounter (Signed)
Patient called and asked for an alternative to Losartan as it is on recall? Please advise.

## 2019-03-01 DIAGNOSIS — I10 Essential (primary) hypertension: Secondary | ICD-10-CM | POA: Diagnosis not present

## 2019-03-02 ENCOUNTER — Encounter: Payer: Self-pay | Admitting: Internal Medicine

## 2019-03-02 ENCOUNTER — Ambulatory Visit (HOSPITAL_COMMUNITY)
Admission: RE | Admit: 2019-03-02 | Discharge: 2019-03-02 | Disposition: A | Discharge status: Home / Self Care | Payer: BC Managed Care – PPO | Source: Ambulatory Visit | Attending: Internal Medicine | Admitting: Internal Medicine

## 2019-03-02 ENCOUNTER — Ambulatory Visit (INDEPENDENT_AMBULATORY_CARE_PROVIDER_SITE_OTHER): Payer: BC Managed Care – PPO | Admitting: Internal Medicine

## 2019-03-02 ENCOUNTER — Other Ambulatory Visit: Payer: Self-pay

## 2019-03-02 VITALS — BP 132/77 | HR 63 | Temp 98.3°F | Ht 66.0 in | Wt 258.9 lb

## 2019-03-02 DIAGNOSIS — F410 Panic disorder [episodic paroxysmal anxiety] without agoraphobia: Secondary | ICD-10-CM | POA: Diagnosis not present

## 2019-03-02 DIAGNOSIS — F329 Major depressive disorder, single episode, unspecified: Secondary | ICD-10-CM | POA: Diagnosis not present

## 2019-03-02 DIAGNOSIS — R0600 Dyspnea, unspecified: Secondary | ICD-10-CM | POA: Diagnosis not present

## 2019-03-02 DIAGNOSIS — R0609 Other forms of dyspnea: Secondary | ICD-10-CM | POA: Insufficient documentation

## 2019-03-02 DIAGNOSIS — Z87891 Personal history of nicotine dependence: Secondary | ICD-10-CM

## 2019-03-02 DIAGNOSIS — R0602 Shortness of breath: Secondary | ICD-10-CM | POA: Diagnosis not present

## 2019-03-02 LAB — BASIC METABOLIC PANEL
BUN/Creatinine Ratio: 14 (ref 9–23)
BUN: 13 mg/dL (ref 6–24)
CO2: 19 mmol/L — ABNORMAL LOW (ref 20–29)
Calcium: 8.9 mg/dL (ref 8.7–10.2)
Chloride: 105 mmol/L (ref 96–106)
Creatinine, Ser: 0.92 mg/dL (ref 0.57–1.00)
GFR calc Af Amer: 88 mL/min/{1.73_m2} (ref >59)
GFR calc non Af Amer: 76 mL/min/{1.73_m2} (ref >59)
Glucose: 84 mg/dL (ref 65–99)
Potassium: 4 mmol/L (ref 3.5–5.2)
Sodium: 136 mmol/L (ref 134–144)

## 2019-03-02 LAB — CBC WITH DIFFERENTIAL/PLATELET
Abs Immature Granulocytes: 0.03 10*3/uL (ref 0.00–0.07)
Basophils Absolute: 0 10*3/uL (ref 0.0–0.1)
Basophils Relative: 0 %
Eosinophils Absolute: 0.2 10*3/uL (ref 0.0–0.5)
Eosinophils Relative: 3 %
HCT: 36.8 % (ref 36.0–46.0)
Hemoglobin: 12.6 g/dL (ref 12.0–15.0)
Immature Granulocytes: 0 %
Lymphocytes Relative: 26 %
Lymphs Abs: 1.7 10*3/uL (ref 0.7–4.0)
MCH: 31.1 pg (ref 26.0–34.0)
MCHC: 34.2 g/dL (ref 30.0–36.0)
MCV: 90.9 fL (ref 80.0–100.0)
Monocytes Absolute: 0.7 10*3/uL (ref 0.1–1.0)
Monocytes Relative: 11 %
Neutro Abs: 4 10*3/uL (ref 1.7–7.7)
Neutrophils Relative %: 60 %
Platelets: 190 10*3/uL (ref 150–400)
RBC: 4.05 MIL/uL (ref 3.87–5.11)
RDW: 13.5 % (ref 11.5–15.5)
WBC: 6.7 10*3/uL (ref 4.0–10.5)
nRBC: 0 % (ref 0.0–0.2)

## 2019-03-02 LAB — BRAIN NATRIURETIC PEPTIDE: B Natriuretic Peptide: 32.5 pg/mL (ref 0.0–100.0)

## 2019-03-02 NOTE — Assessment & Plan Note (Addendum)
Has been dealing with depression and anxiety for around 10 years now.  A lot of this stems from her son passing in 2012 from an accident involving a tree branch.  She has lost other family members as well.  Sees a counselor at [employer] but not a psychiatrist and her cardiologist suggested she see a psychiatrist.  She has been referred to Dr. Casimiro Needle.  Panic attacks started around June-July.  Happen suddenly, last episode she was talking on the phone and had an episode.  She was off work for a month.  Came in and followed up with our behavioral health specialist.  She was placed on a heart monitor on Friday but has not been told about any arrhythmias and has had a panic attack on Saturday.  She describes the panic attack as sudden shortness of breath feeling she's being smothered, sweating,  Palpitations, feels flushed, has some tingling in finger tips, wakes her up at night when she's sleeping.  She denies any chest pain.  Episodes last 5/10 minutes.  Had a stress test and heart monitor placed last year as well.  2-3 pillow orthopnea at home, very short of breath when laying flat.  Walking from here to our door she is very short winded.  Sleep study last year did not show significant sleep apnea AHI 5/hr.  Some features of panic disorder however also some features of a possible cardiopulmonary process    -BNP, CBC -repeat ECHO -will order PFT's with Hx of asthma  -chest x-ray -will help pt coordinate appointment with Dr. Casimiro Needle while she is here

## 2019-03-02 NOTE — Patient Instructions (Addendum)
Stacey Price it was a pleasure to meet you.  We will run some more tests on your lungs and heart to try and figure out why you are getting so short of breath.  Please follow up with your psychiatrist when he is available I have listed his number below.  If you need another referral let us know.  Please follow up with [place] in about 3 weeks but possibly sooner based on your results.    Dr. Casimiro Needle Address: [address], [place], [place] [address] Phone: [phone]

## 2019-03-02 NOTE — Progress Notes (Signed)
   CC: Panic attacks  HPI:  [name] is a 43 y.o. female with PMH below.  Today we will address Panic attacks   Please see A&P for status of the patient's chronic medical conditions  Past Medical History:  Diagnosis Date  . Asthma   . Hypertension   . Migraines   . Obesity   . Panic attacks   . Peritonitis ([place]) 2014   Review of Systems:  ROS: Pulmonary: pt endorses periodic shortness of breath Cardiac: pt denies chest pain,  Abdominal: pt denies abdominal pain, nausea, vomiting, or diarrhea   Physical Exam:  Vitals:   03/02/19 0906  BP: 132/77  Pulse: 63  Temp: 98.3 F (36.8 C)  TempSrc: Oral  SpO2: 100%  Weight: 258 lb 14.4 oz (117.4 kg)  Height: 5\' 6"  (1.676 m)   Cardiac:  normal rate and rhythm, clear s1 and s2, no murmurs, rubs or gallops, 2+ LE edema Pulmonary: decreased bibasilar breath sounds otherwise clear Abdominal: non distended abdomen, soft and nontender Psych: Alert, conversant, in good spirits   Social History   Socioeconomic History  . Marital status: Single    Spouse name: Not on file  . Number of children: 2  . Years of education: 78  . Highest education level: Not on file  Occupational History  . Occupation: collections and recovery agent  Social Needs  . Financial resource strain: Not on file  . Food insecurity    Worry: Not on file    Inability: Not on file  . Transportation needs    Medical: Not on file    Non-medical: Not on file  Tobacco Use  . Smoking status: Former Smoker    Packs/day: 1.00    Years: 7.00    Pack years: 7.00    Quit date: 11/23/2014    Years since quitting: 4.2  . Smokeless tobacco: Never Used  Substance and Sexual Activity  . Alcohol use: Yes    Frequency: Never    Comment: Rare  . Drug use: No  . Sexual activity: Yes    Partners: Female    Birth control/protection: Surgical  Lifestyle  . Physical activity    Days per week: Not on file    Minutes per session: Not on file  . Stress: Not  on file  Relationships  . Social Herbalist on phone: Not on file    Gets together: Not on file    Attends religious service: Not on file    Active member of club or organization: Not on file    Attends meetings of clubs or organizations: Not on file    Relationship status: Not on file  . Intimate partner violence    Fear of current or ex partner: Not on file    Emotionally abused: Not on file    Physically abused: Not on file    Forced sexual activity: Not on file  Other Topics Concern  . Not on file  Social History Narrative   Lives at home with a friend.   Right-handed.   1 cup caffeine some days.    Family History  Problem Relation Age of Onset  . Hypertension Mother   . Hypertension Father   . Breast cancer Cousin   . Ovarian cancer Paternal Grandmother     Assessment & Plan:   See Encounters Tab for problem based charting.  Patient discussed with Dr. Dareen Piano

## 2019-03-02 NOTE — Assessment & Plan Note (Addendum)
Patient reports episodes of sudden shortness of breath.  She describes also having symptoms  suggestive of PND.  Episodes last 5-10 minutes.  Had a stress test and heart monitor placed last year with very poor exercise tolerance.  2-3 pillow orthopnea at home, very short of breath when laying flat.  Walking from here to our door she is very short winded.  Sleep study last year did not show significant sleep apnea AHI 5/hr.  Uses PRN albuterol but doesn't find it very helpful.    -evaluate further with bnp, repeat echo, PFT's, CBC, chest x-ray

## 2019-03-03 ENCOUNTER — Ambulatory Visit (HOSPITAL_COMMUNITY): Payer: BC Managed Care – PPO

## 2019-03-03 DIAGNOSIS — R002 Palpitations: Secondary | ICD-10-CM | POA: Diagnosis not present

## 2019-03-03 NOTE — Progress Notes (Signed)
Internal Medicine Clinic Attending  Case discussed with Dr. Winfrey  at the time of the visit.  We reviewed the resident's history and exam and pertinent patient test results.  I agree with the assessment, diagnosis, and plan of care documented in the resident's note.  

## 2019-03-07 ENCOUNTER — Telehealth: Payer: Self-pay | Admitting: Internal Medicine

## 2019-03-07 DIAGNOSIS — R002 Palpitations: Secondary | ICD-10-CM | POA: Diagnosis not present

## 2019-03-07 NOTE — Telephone Encounter (Signed)
I mentioned that it was a possibility once her workup was complete.  I have not yet prescribed any therapy and workup is not complete.

## 2019-03-07 NOTE — Telephone Encounter (Signed)
Return call to pt - stated she was seen last week ; wanted to know if the doctor ordered a fluid pill which was mentioned. I told her I do not see new med was prescribed. Stated CXR was done; but she did not do echo b/c of high co-pay.

## 2019-03-07 NOTE — Telephone Encounter (Signed)
Pt is requesting a callback from the nurse [phone]

## 2019-03-07 NOTE — Telephone Encounter (Signed)
Pt called and informed of Dr Maudie Flakes response, waitung on work-up.

## 2019-03-10 ENCOUNTER — Telehealth: Payer: Self-pay | Admitting: Internal Medicine

## 2019-03-10 NOTE — Telephone Encounter (Signed)
Pt is requesting a nurse to callback [phone]

## 2019-03-10 NOTE — Telephone Encounter (Signed)
No answer, lm for rtc 

## 2019-03-14 DIAGNOSIS — F331 Major depressive disorder, recurrent, moderate: Secondary | ICD-10-CM | POA: Diagnosis not present

## 2019-03-14 DIAGNOSIS — F4310 Post-traumatic stress disorder, unspecified: Secondary | ICD-10-CM | POA: Diagnosis not present

## 2019-03-14 DIAGNOSIS — F1020 Alcohol dependence, uncomplicated: Secondary | ICD-10-CM | POA: Diagnosis not present

## 2019-03-14 MED FILL — CITALOPRAM HBR 10 MG TABLET: 10 | 90 days supply | Qty: 90 | Fill #0

## 2019-03-14 MED FILL — traZODone HCL 50 MG TABS: 50 | 90 days supply | Qty: 90 | Fill #0

## 2019-03-17 ENCOUNTER — Telehealth: Payer: Self-pay | Admitting: Internal Medicine

## 2019-03-17 DIAGNOSIS — F411 Generalized anxiety disorder: Secondary | ICD-10-CM | POA: Diagnosis not present

## 2019-03-17 NOTE — Telephone Encounter (Signed)
Returned call to patient. States she is currently on leave from [employer]. They have FMLA paperwork with Nov 9th as return date but they are requesting a note on letterhead stating this as well. Patient is on MyChart and can print letter once it is wrritten. Hubbard Hartshorn, RN, BSN

## 2019-03-17 NOTE — Telephone Encounter (Signed)
Requesting to speak with Dr. Daryll Drown about getting a note to return to work after [facility]. 9, please call pt back.

## 2019-03-22 ENCOUNTER — Telehealth: Payer: Self-pay | Admitting: Internal Medicine

## 2019-03-22 NOTE — Telephone Encounter (Signed)
Pt is requesting a nurse to callback [phone]

## 2019-03-22 NOTE — Telephone Encounter (Signed)
I will take care of this when I am in the office tomorrow.  Apologies for the late reply!

## 2019-03-22 NOTE — Telephone Encounter (Signed)
This is being addressed in phone encounter started 03/17/2019. Hubbard Hartshorn, BSN, RN-BC

## 2019-03-23 ENCOUNTER — Encounter: Payer: Self-pay | Admitting: Internal Medicine

## 2019-03-23 ENCOUNTER — Ambulatory Visit: Payer: Medicaid Other

## 2019-03-23 NOTE — Telephone Encounter (Signed)
Pt calling back to f/u with her Letter that was requested.  Please call the patient back.

## 2019-03-23 NOTE — Telephone Encounter (Signed)
Letter done in chart.  Thank you

## 2019-03-24 ENCOUNTER — Encounter: Payer: Self-pay | Admitting: *Deleted

## 2019-03-24 NOTE — Telephone Encounter (Signed)
Pt informed via MyChart

## 2019-03-26 DIAGNOSIS — F411 Generalized anxiety disorder: Secondary | ICD-10-CM | POA: Diagnosis not present

## 2019-03-30 ENCOUNTER — Ambulatory Visit: Payer: BC Managed Care – PPO | Admitting: Cardiology

## 2019-04-01 ENCOUNTER — Encounter: Payer: Self-pay | Admitting: Cardiology

## 2019-04-01 ENCOUNTER — Ambulatory Visit (INDEPENDENT_AMBULATORY_CARE_PROVIDER_SITE_OTHER): Payer: BC Managed Care – PPO | Admitting: Cardiology

## 2019-04-01 ENCOUNTER — Other Ambulatory Visit: Payer: Self-pay

## 2019-04-01 VITALS — BP 120/71 | HR 53 | Temp 98.4°F | Ht 66.0 in | Wt 253.0 lb

## 2019-04-01 DIAGNOSIS — R0789 Other chest pain: Secondary | ICD-10-CM | POA: Diagnosis not present

## 2019-04-01 DIAGNOSIS — F419 Anxiety disorder, unspecified: Secondary | ICD-10-CM | POA: Diagnosis not present

## 2019-04-01 DIAGNOSIS — I10 Essential (primary) hypertension: Secondary | ICD-10-CM | POA: Diagnosis not present

## 2019-04-01 DIAGNOSIS — R002 Palpitations: Secondary | ICD-10-CM

## 2019-04-01 NOTE — Progress Notes (Signed)
Primary Physician:  Inez CatalinaMullen, Emily B, MD   Patient ID: Stacey GrovesAngela Pewitt, female    DOB: 26-Dec-1975, 43 y.o.   MRN: [id number]  Subjective:    Chief Complaint  Patient presents with  . Hypertension  . Palpitations  . Follow-up    HPI: Stacey Grovesngela Boughner  is a 43 y.o. 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  She had undergone echocardiogram and also a routine treadmill stress test in March 2019 which is essentially nonischemic and echo was normal. She had improvement in palpitations with Inderal.   She was recently seen for acute visit for daily palpitations described as heart racing and associated with shortness of breath and chest tightness. Her symptoms were felt to be contributed by anxiety, but due to her symptoms, was placed on 48 hour holter monitor and now presents for follow up.   She continues to have episodes like before. Heart racing episodes can last for 10-15 minutes and resolve with taking deep breaths. She does have some shortness of breath with lying flat. She is still walking daily and tolerates this fairly well. Does have dyspnea and chest tightness if she over excerts herself.   She was referred to Psych at her last visit, but has not heard anything from their office. Past Medical History:  Diagnosis Date  . Asthma   . Hypertension   . Migraines   . Obesity   . Panic attacks   . Peritonitis (HCC) 2014    Past Surgical History:  Procedure Laterality Date  . ABDOMINAL HYSTERECTOMY  2009   [facility]   . CESAREAN SECTION     1995-2000    Social History   Socioeconomic History  . Marital status: Single    Spouse name: Not on file  . Number of children: 2  . Years of education: 1814  . Highest education level: Not on file  Occupational History  . Occupation: collections and recovery agent  Social Needs  . Financial resource strain: Not on file  . Food  insecurity    Worry: Not on file    Inability: Not on file  . Transportation needs    Medical: Not on file    Non-medical: Not on file  Tobacco Use  . Smoking status: Former Smoker    Packs/day: 1.00    Years: 7.00    Pack years: 7.00    Quit date: 11/23/2014    Years since quitting: 4.3  . Smokeless tobacco: Never Used  Substance and Sexual Activity  . Alcohol use: Yes    Frequency: Never    Comment: Rare  . Drug use: No  . Sexual activity: Yes    Partners: Female    Birth control/protection: Surgical  Lifestyle  . Physical activity    Days per week: Not on file    Minutes per session: Not on file  . Stress: Not on file  Relationships  . Social Musicianconnections    Talks on phone: Not on file    Gets together: Not on file    Attends religious service: Not on file    Active member of club or organization: Not on file    Attends meetings of clubs or organizations: Not on file    Relationship status: Not on file  . Intimate partner violence    Fear of current or ex partner: Not on file    Emotionally abused: Not on  file    Physically abused: Not on file    Forced sexual activity: Not on file  Other Topics Concern  . Not on file  Social History Narrative   Lives at home with a friend.   Right-handed.   1 cup caffeine some days.    Review of Systems  Constitution: Negative for decreased appetite, malaise/fatigue, weight gain and weight loss.  Eyes: Negative for visual disturbance.  Cardiovascular: Positive for chest pain and palpitations. Negative for claudication, dyspnea on exertion, leg swelling, orthopnea and syncope.  Respiratory: Positive for shortness of breath. Negative for hemoptysis and wheezing.   Endocrine: Negative for cold intolerance and heat intolerance.  Hematologic/Lymphatic: Does not bruise/bleed easily.  Skin: Negative for nail changes.  Musculoskeletal: Negative for muscle weakness and myalgias.  Gastrointestinal: Negative for abdominal pain, change  in bowel habit, nausea and vomiting.  Neurological: Negative for difficulty with concentration, dizziness, focal weakness and headaches.  Psychiatric/Behavioral: Positive for depression. Negative for altered mental status and suicidal ideas. The patient is nervous/anxious.   All other systems reviewed and are negative.     Objective:  Blood pressure 120/71, pulse (!) 53, temperature 98.4 F (36.9 C), height 5\' 6"  (1.676 m), weight 253 lb (114.8 kg), SpO2 100 %. Body mass index is 40.84 kg/m.    Physical Exam  Constitutional: She is oriented to person, place, and time. Vital signs are normal. She appears well-developed and well-nourished.  HENT:  Head: Normocephalic and atraumatic.  Neck: Normal range of motion.  Cardiovascular: Normal rate, regular rhythm, normal heart sounds and intact distal pulses.  Pulmonary/Chest: Effort normal and breath sounds normal. No accessory muscle usage. No respiratory distress.  Abdominal: Soft. Bowel sounds are normal.  Musculoskeletal: Normal range of motion.  Neurological: She is alert and oriented to person, place, and time.  Skin: Skin is warm and dry.  Vitals reviewed.  Radiology: No results found.  Laboratory examination:    CMP Latest Ref Rng & Units 03/01/2019 12/11/2017 08/28/2017  Glucose 65 - 99 mg/dL 84 94 -  BUN 6 - 24 mg/dL 13 7 -  Creatinine 0.57 - 1.00 mg/dL 0.92 0.86 -  Sodium 134 - 144 mmol/L 136 138 -  Potassium 3.5 - 5.2 mmol/L 4.0 3.9 -  Chloride 96 - 106 mmol/L 105 109 -  CO2 20 - 29 mmol/L 19(L) 23 -  Calcium 8.7 - 10.2 mg/dL 8.9 8.7(L) -  Total Protein 6.5 - 8.1 g/dL - 7.2 7.1  Total Bilirubin 0.3 - 1.2 mg/dL - 1.2 -  Alkaline Phos 38 - 126 U/L - 51 -  AST 15 - 41 U/L - 27 -  ALT 14 - 54 U/L - 20 -   CBC Latest Ref Rng & Units 03/02/2019 12/11/2017 05/06/2017  WBC 4.0 - 10.5 K/uL 6.7 6.5 6.7  Hemoglobin 12.0 - 15.0 g/dL 12.6 12.9 12.8  Hematocrit 36.0 - 46.0 % 36.8 38.8 38.8  Platelets 150 - 400 K/uL 190 205 214    Lipid Panel  No results found for: CHOL, TRIG, HDL, CHOLHDL, VLDL, LDLCALC, LDLDIRECT HEMOGLOBIN A1C No results found for: HGBA1C, MPG TSH No results for input(s): TSH in the last 8760 hours.  PRN Meds:. Medications Discontinued During This Encounter  Medication Reason  . amLODipine (NORVASC) 5 MG tablet Error  . propranolol ER (INDERAL LA) 80 MG 24 hr capsule Error   Current Meds  Medication Sig  . albuterol (PROVENTIL HFA;VENTOLIN HFA) 108 (90 Base) MCG/ACT inhaler Inhale 1-2 puffs into the lungs  every 6 (six) hours as needed for wheezing or shortness of breath.  . citalopram (CELEXA) 10 MG tablet Take 10 mg by mouth daily.  . cyclobenzaprine (FLEXERIL) 5 MG tablet Take 1 tablet (5 mg total) by mouth as needed for muscle spasms.  . diclofenac sodium (VOLTAREN) 1 % GEL Apply 2 g topically 4 (four) times daily.  [name] dicyclomine (BENTYL) 20 MG tablet Take 1 tablet (20 mg total) by mouth 3 times/day as needed-between meals & bedtime (Abdominal pain).  . Fluticasone-Salmeterol (ADVAIR) 250-50 MCG/DOSE AEPB Inhale 1 puff into the lungs 2 (two) times daily.  . Fremanezumab-vfrm (AJOVY) 225 MG/1.5ML SOSY Inject 225 mg into the skin every 30 (thirty) days.  [name] loratadine (CLARITIN) 10 MG tablet Take 1 tablet (10 mg total) by mouth daily as needed for allergies.  . meloxicam (MOBIC) 7.5 MG tablet Take 1 tablet (7.5 mg total) by mouth 2 (two) times daily as needed for pain.  [name] olmesartan (BENICAR) 20 MG tablet Take 1 tablet (20 mg total) by mouth daily.  [name] omeprazole (PRILOSEC) 20 MG capsule Take 20 mg by mouth daily.  . propranolol ER (INDERAL LA) 80 MG 24 hr capsule Take 80 mg by mouth daily.  . rizatriptan (MAXALT-MLT) 10 MG disintegrating tablet Take 1 tablet (10 mg total) by mouth as needed. May repeat in 2 hours if needed  . topiramate (TOPAMAX) 100 MG tablet Take 1 tablet (100 mg total) by mouth 2 (two) times daily.  . traZODone (DESYREL) 50 MG tablet Take 50 mg by mouth at bedtime.  .  [DISCONTINUED] amLODipine (NORVASC) 5 MG tablet Take 1 tablet (5 mg total) by mouth daily.    Cardiac Studies:   Echocardiogram 09/16/2017: Left ventricle cavity is normal in size. Mild concentric hypertrophy of the left ventricle. Normal global wall motion. Normal diastolic filling pattern. Calculated EF 61%. Left atrial cavity is mildly dilated. Mild (Grade I) mitral regurgitation. Mild tricuspid regurgitation. Estimated pulmonary artery systolic pressure 24 mmHg.  Holter monitor 48 hours 02//2019: Total of 48 ectopic ventricular beats, rare PACs, one episode of ventricle duplex at the rate of 100 bpm, no other complex arrhythmias.  Exercise Treadmill Stress Test 09/04/2017: Indication: Palpitations The patient exercised on Bruce protocol for 04:46 min. Patient achieved 6.73 METS and reached HR 154 bpm, which is 87 % of maximum age-predicted HR. Stress test terminated due to chest pain and SOB.  Exercise capacity was below average for age.  HR Response to Exercise: Appropriate. BP Response to Exercise: Resting hypertension- exaggerated response, max BP 210/90 mmHg Chest Pain: limiting. Arrhythmias: none. ST Changes: With peak exercise there was no ST-T changes of ischemia.  Overall Impression: Normal EKG stress test, although with poor exercise capacity and exersice limiting chest pain. Clinical correlation recommended.  Assessment:   Atypical chest pain - Plan: PCV MYOCARDIAL PERFUSION WO LEXISCAN  Palpitations  Essential hypertension  Anxiety  EKG 02/23/2019: Sinus bradycardia at 55 bpm, normal axis, no evidence of ischemia.   Recommendations:   Patient was recently seen for episodes of feeling of elevated heart rate, shortness of breath, and chest discomfort that occurs with overexertion. She has chronic dyspnea on exertion that is essentially unchanged.  She has seen her PCP, who recommended echocardiogram, but was unable to have this done due to high co-pay. There is no  clinical evidence of heart failure, chest x-ray was normal, and BNP also normal.  I do not see any indication for echocardiogram at this point.    She did  previously have slightly abnormal treadmill stress testing 1 year ago due to low exercise capacity and exercise limiting chest pain.  Due to her persistent symptoms, will obtain exercise nuclear stress test to exclude this as a possible etiology of her symptoms. Will also start her on Imdur to see if she has improvement in symptoms with the medication.  I do continue to feel that her symptoms are related to anxiety. I will follow up on referral that was placed for this. I discussed recent 48-hour Holter monitor results, she had one episode while wearing her monitor that occurred on day 2 at around 4:30 PM this correlated with mild sinus tachycardia.  No SVT was noted.  Blood pressure has improved with addition of olmesartan, will continue same. I do feel that her weight is also likely contributing to her symptoms and she was counseled on the importance of diet changes to help with this.  I will see her back in 3 weeks after her stress test.  Advised her to take it easy until seen by me.    *I have discussed this case with Dr. Rosemary Holms and he participated in formulating the plan.*    Toniann Fail, MSN, APRN, FNP-C [facility] Office: [phone] Fax: [phone]

## 2019-04-05 DIAGNOSIS — F411 Generalized anxiety disorder: Secondary | ICD-10-CM | POA: Diagnosis not present

## 2019-04-05 MED ORDER — ISOSORBIDE MONONITRATE ER 30 MG PO TB24: 30.0000 mg | ORAL_TABLET | Freq: Every day | ORAL | 2 refills | Status: DC

## 2019-04-05 MED FILL — ISOSORBIDE MN ER 30 MG TAB: 30 | 30 days supply | Qty: 30 | Fill #0

## 2019-04-06 ENCOUNTER — Encounter: Payer: Self-pay | Admitting: Cardiology

## 2019-04-06 NOTE — Telephone Encounter (Signed)
Lm for rtc 

## 2019-04-19 ENCOUNTER — Encounter (INDEPENDENT_AMBULATORY_CARE_PROVIDER_SITE_OTHER): Payer: Self-pay

## 2019-04-25 MED FILL — ISOSORBIDE MN ER 30 MG TAB: 30 | 30 days supply | Qty: 30 | Fill #0

## 2019-04-27 DIAGNOSIS — F4310 Post-traumatic stress disorder, unspecified: Secondary | ICD-10-CM | POA: Diagnosis not present

## 2019-04-29 ENCOUNTER — Ambulatory Visit (INDEPENDENT_AMBULATORY_CARE_PROVIDER_SITE_OTHER): Payer: BC Managed Care – PPO | Admitting: Internal Medicine

## 2019-04-29 ENCOUNTER — Encounter: Payer: Self-pay | Admitting: Internal Medicine

## 2019-04-29 ENCOUNTER — Other Ambulatory Visit: Payer: Self-pay

## 2019-04-29 VITALS — BP 133/76 | HR 64 | Temp 98.6°F | Ht 67.0 in | Wt 253.8 lb

## 2019-04-29 DIAGNOSIS — Z79899 Other long term (current) drug therapy: Secondary | ICD-10-CM

## 2019-04-29 DIAGNOSIS — I10 Essential (primary) hypertension: Secondary | ICD-10-CM

## 2019-04-29 DIAGNOSIS — R079 Chest pain, unspecified: Secondary | ICD-10-CM

## 2019-04-29 DIAGNOSIS — R002 Palpitations: Secondary | ICD-10-CM

## 2019-04-29 DIAGNOSIS — G43709 Chronic migraine without aura, not intractable, without status migrainosus: Secondary | ICD-10-CM

## 2019-04-29 DIAGNOSIS — G43909 Migraine, unspecified, not intractable, without status migrainosus: Secondary | ICD-10-CM | POA: Diagnosis not present

## 2019-04-29 DIAGNOSIS — R0609 Other forms of dyspnea: Secondary | ICD-10-CM

## 2019-04-29 DIAGNOSIS — J309 Allergic rhinitis, unspecified: Secondary | ICD-10-CM

## 2019-04-29 DIAGNOSIS — J45909 Unspecified asthma, uncomplicated: Secondary | ICD-10-CM | POA: Diagnosis not present

## 2019-04-29 DIAGNOSIS — G8929 Other chronic pain: Secondary | ICD-10-CM

## 2019-04-29 DIAGNOSIS — F410 Panic disorder [episodic paroxysmal anxiety] without agoraphobia: Secondary | ICD-10-CM | POA: Diagnosis not present

## 2019-04-29 DIAGNOSIS — Z0000 Encounter for general adult medical examination without abnormal findings: Secondary | ICD-10-CM

## 2019-04-29 DIAGNOSIS — IMO0002 Reserved for concepts with insufficient information to code with codable children: Secondary | ICD-10-CM

## 2019-04-29 DIAGNOSIS — R0600 Dyspnea, unspecified: Secondary | ICD-10-CM

## 2019-04-29 MED ORDER — LORATADINE 10 MG PO TABS: 10.0000 mg | ORAL_TABLET | Freq: Every day | ORAL | 2 refills | Status: DC | PRN

## 2019-04-29 NOTE — Progress Notes (Signed)
   Subjective:    Patient ID: Stacey Price, female    DOB: 02-28-1976, 43 y.o.   MRN: [id number]  CC: Follow up for HTN, anxiety  HPI  Stacey Price is a 43 year old woman with PMH of Anxiety/depression, HTN, chronic migraine, asthma/rhinitis, palpitations/chronic chest pain and SOB who presents for follow up.   Stacey Price replies that she is doing okay, but that her anxiety is increased.  She is currently on citalopram and trazodone.  She has a PHQ-9 of 23 up from 16.  She sees a psychiatrist and a therapist.  The psychiatrist is planning to modify her medication regimen and I advised her that she should follow that providers recommendations.  She requests a note to be out of work until the new year, until her CV studies are completed.   She has chronic SOB and occasional chest pain.  Her cardiology team plans to do a stress test.  They have requested results of sleep study which I will send over to them.   We discussed screening tests and given the new USPSTF recommendations, she would qualify for starting testing for colon cancer at 43.  She is AA and has a FM of colon cancer in her GM.     She requests a short term parking tag given her SOB and fatigue, paperwork was provided.   Review of Systems  Constitutional: Positive for fatigue. Negative for activity change, appetite change, fever and unexpected weight change.  Respiratory: Positive for chest tightness and shortness of breath. Negative for cough and wheezing.   Cardiovascular: Positive for chest pain and palpitations. Negative for leg swelling.  Gastrointestinal: Negative for abdominal pain, diarrhea and nausea.  Neurological: Positive for headaches. Negative for dizziness, weakness and light-headedness.  Psychiatric/Behavioral: Positive for decreased concentration and dysphoric mood. The patient is nervous/anxious.        Objective:   Physical Exam Constitutional:      General: She is not in acute distress.    Appearance:  Normal appearance. She is obese. She is not toxic-appearing.  HENT:     Head: Normocephalic and atraumatic.  Cardiovascular:     Rate and Rhythm: Normal rate.     Pulses: Normal pulses.     Heart sounds: No murmur.  Pulmonary:     Effort: Pulmonary effort is normal. No respiratory distress.     Breath sounds: Normal breath sounds. No wheezing.  Abdominal:     General: There is no distension.     Palpations: Abdomen is soft.  Neurological:     General: No focal deficit present.     Mental Status: She is alert and oriented to person, place, and time.  Psychiatric:        Mood and Affect: Mood normal.        Behavior: Behavior normal.     Comments: She consistently has a somewhat flat affect.     FIT test today.        Assessment & Plan:  Return in 3-4 months for chronic issues, follow up with psychiatry and cardiology in the interim.

## 2019-04-29 NOTE — Patient Instructions (Signed)
Ms. Shook - -  Thank you for coming in today.   For your colon cancer screening, please send the FIT test back as instructed.    I have placed a consult to [facility] to help with weight loss.   Please come back in 3-4 months, sooner if needed.

## 2019-05-02 ENCOUNTER — Telehealth: Payer: Self-pay

## 2019-05-02 ENCOUNTER — Encounter: Payer: Self-pay | Admitting: Internal Medicine

## 2019-05-02 NOTE — Assessment & Plan Note (Signed)
She is being followed by Cardiology and she reports that they plan for a stress test.  Will send over the results of her sleep study.  She is on IMDUR and reports feeling somewhat better.  Plan Follow up as scheduled with Cardiology.

## 2019-05-02 NOTE — Assessment & Plan Note (Signed)
Following with Neurology.  On Ajovy, Maxalt and Topamax.  She reports improvement in symtpoms.

## 2019-05-02 NOTE — Assessment & Plan Note (Signed)
Requesting a refill on loratadine, which was provided.

## 2019-05-02 NOTE — Telephone Encounter (Signed)
I probably did put the wrong year, sorry!  Will correct this today.

## 2019-05-02 NOTE — Telephone Encounter (Signed)
Done, on mychart.  Left a copy of letter in my box if she comes by for it.

## 2019-05-02 NOTE — Telephone Encounter (Signed)
Pt states Dr. Daryll Drown wrote the wrong date for out of work. Pt requesting the letter to be written again, with the correct date out of work for January 2021 instead 2020.

## 2019-05-02 NOTE — Assessment & Plan Note (Signed)
BP today is well controlled at 789 systolic.  She is taking her olmesartan and propranolol without issue.  HR today is 64.   Plan Continue olmesartan and propranolol Recent blood work in September showed a normal renal function.

## 2019-05-02 NOTE — Assessment & Plan Note (Signed)
Advised her to discuss further with her psychiatrist and therapist.  Continue current therapy.

## 2019-05-05 DIAGNOSIS — F411 Generalized anxiety disorder: Secondary | ICD-10-CM | POA: Diagnosis not present

## 2019-05-06 ENCOUNTER — Telehealth: Payer: Self-pay | Admitting: *Deleted

## 2019-05-06 NOTE — Telephone Encounter (Signed)
Thank you :)

## 2019-05-06 NOTE — Telephone Encounter (Signed)
Called pt - informed she can pick up letter per Dr Daryll Drown. State she has received it thru My Chart. I will mail copy to pt also.

## 2019-05-09 ENCOUNTER — Other Ambulatory Visit: Payer: BC Managed Care – PPO

## 2019-05-10 ENCOUNTER — Encounter: Payer: BC Managed Care – PPO | Admitting: Dietician

## 2019-05-11 ENCOUNTER — Other Ambulatory Visit: Payer: Self-pay

## 2019-05-11 ENCOUNTER — Ambulatory Visit (INDEPENDENT_AMBULATORY_CARE_PROVIDER_SITE_OTHER): Payer: BC Managed Care – PPO

## 2019-05-11 ENCOUNTER — Telehealth: Payer: Self-pay

## 2019-05-11 DIAGNOSIS — R0789 Other chest pain: Secondary | ICD-10-CM | POA: Diagnosis not present

## 2019-05-11 NOTE — Telephone Encounter (Signed)
Pt called and was complaining about FMLA form not being filled out properly.

## 2019-05-17 ENCOUNTER — Encounter: Payer: BC Managed Care – PPO | Admitting: Dietician

## 2019-05-23 NOTE — Addendum Note (Signed)
Addended by: Hulan Fray on: 05/23/2019 04:10 PM   Modules accepted: Orders

## 2019-05-24 DIAGNOSIS — Z0000 Encounter for general adult medical examination without abnormal findings: Secondary | ICD-10-CM | POA: Diagnosis not present

## 2019-05-30 ENCOUNTER — Other Ambulatory Visit: Payer: BC Managed Care – PPO

## 2019-05-30 ENCOUNTER — Other Ambulatory Visit: Payer: Self-pay

## 2019-05-30 DIAGNOSIS — Z0000 Encounter for general adult medical examination without abnormal findings: Secondary | ICD-10-CM

## 2019-05-31 ENCOUNTER — Ambulatory Visit (INDEPENDENT_AMBULATORY_CARE_PROVIDER_SITE_OTHER): Payer: BC Managed Care – PPO | Admitting: Internal Medicine

## 2019-05-31 ENCOUNTER — Other Ambulatory Visit: Payer: Self-pay

## 2019-05-31 DIAGNOSIS — Z0289 Encounter for other administrative examinations: Secondary | ICD-10-CM

## 2019-05-31 DIAGNOSIS — F322 Major depressive disorder, single episode, severe without psychotic features: Secondary | ICD-10-CM

## 2019-05-31 LAB — FECAL OCCULT BLOOD, IMMUNOCHEMICAL: Fecal Occult Bld: NEGATIVE

## 2019-05-31 NOTE — Progress Notes (Signed)
  [facility] Res[facility] Telephone Encounter Continuity Care Appointment  HPI:   This telephone encounter was created for [name] on 05/31/2019 for the following purpose/cc paperwork.  Stacey Price is a 43 year old woman with PMH Of palpitations, HTN, migraine who requested some paperwork filled out.  The paperwork is behavioral health disability paperwork.  I do not treat her for behavioral health issues.  I did provide her with letters to recommend return to work at her request.  She is now seeking back pay for the time she was out.     Past Medical History:  Past Medical History:  Diagnosis Date  . Asthma   . Hypertension   . Migraines   . Obesity   . Panic attacks   . Peritonitis ([address]) 2014      ROS:   No complaints today.     Assessment / Plan / Recommendations:   Please see A&P under problem oriented charting for assessment of the patient's acute and chronic medical conditions.   As always, pt is advised that if symptoms worsen or new symptoms arise, they should go to an urgent care facility or to to ER for further evaluation.   Consent and Medical Decision Making:   This is a telephone encounter between Stacey Price and Stacey Price on 05/31/2019 for paperwork to be filled out. The visit was conducted with the patient located at home and Stacey Price at [facility]. The patient's identity was confirmed using their DOB and current address. The patient has consented to being evaluated through a telephone encounter and understands the associated risks (an examination cannot be done and the patient may need to come in for an appointment) / benefits (allows the patient to remain at home, decreasing exposure to coronavirus). I personally spent 10 minutes on medical discussion.    I reviewed the paperwork and the paperwork is best filled out by a psychiatry/psychology provider which I did refer Stacey Price to.  She sees a Teacher, music at [employer] and a Engineer, water as  well.   She will seek out those providers.

## 2019-06-02 DIAGNOSIS — F1020 Alcohol dependence, uncomplicated: Secondary | ICD-10-CM | POA: Diagnosis not present

## 2019-06-02 DIAGNOSIS — F419 Anxiety disorder, unspecified: Secondary | ICD-10-CM | POA: Diagnosis not present

## 2019-06-02 DIAGNOSIS — F411 Generalized anxiety disorder: Secondary | ICD-10-CM | POA: Diagnosis not present

## 2019-06-02 DIAGNOSIS — F4310 Post-traumatic stress disorder, unspecified: Secondary | ICD-10-CM | POA: Diagnosis not present

## 2019-06-02 DIAGNOSIS — F331 Major depressive disorder, recurrent, moderate: Secondary | ICD-10-CM | POA: Diagnosis not present

## 2019-06-02 MED FILL — traZODone HCL 50 MG TABS: 50 | 90 days supply | Qty: 90 | Fill #0

## 2019-06-02 MED FILL — hydrOXYzine HCL 25 MG TABS: 25 | 30 days supply | Qty: 90 | Fill #0

## 2019-06-02 MED FILL — CITALOPRAM HBR 10 MG TABLET: 10 | 90 days supply | Qty: 90 | Fill #0

## 2019-06-02 MED FILL — BUSPIRONE HCL 7.5 MG TABS: 7.5 | 30 days supply | Qty: 90 | Fill #0

## 2019-06-03 ENCOUNTER — Encounter: Payer: Self-pay | Admitting: Internal Medicine

## 2019-06-10 ENCOUNTER — Ambulatory Visit (INDEPENDENT_AMBULATORY_CARE_PROVIDER_SITE_OTHER): Payer: BC Managed Care – PPO | Admitting: Internal Medicine

## 2019-06-10 ENCOUNTER — Other Ambulatory Visit: Payer: Self-pay

## 2019-06-10 DIAGNOSIS — Z79899 Other long term (current) drug therapy: Secondary | ICD-10-CM | POA: Diagnosis not present

## 2019-06-10 DIAGNOSIS — F419 Anxiety disorder, unspecified: Secondary | ICD-10-CM | POA: Diagnosis not present

## 2019-06-10 DIAGNOSIS — Z026 Encounter for examination for insurance purposes: Secondary | ICD-10-CM

## 2019-06-10 DIAGNOSIS — F322 Major depressive disorder, single episode, severe without psychotic features: Secondary | ICD-10-CM

## 2019-06-10 NOTE — Progress Notes (Signed)
  [facility] Res[facility] Telephone Encounter Continuity Care Appointment  HPI:   This telephone encounter was created for [name] on 06/10/2019 for the following purpose/cc Discuss paperwork.    Spoke with [employer].  Changed medication to control Anxiety.  It will take a month for this to start working.  Requesting for return to work be extended to February.  Buspar is the new medication and she is also on a PRN medicine.  She feels that once this medication stabilizes, she will be able to return to work.   FMLA papers - [employer] Notification - Termination of claim for short term disability benefits.  Did not satisfy the plans definition of disability as of January 26, 2019.  We have not been provided with proof of disability and continued proof.  I again advised her that she needs her therapist and her psychiatrist to fill out this paperwork.  As it stands, [employer] has no documentation of her reasons for being out.  I do not manage her mental health issues and neither does Cardiology.  We discussed this today and she will reach out to her other providers.  I am happy to write a letter explaining my understanding and extending her requested leave.     Past Medical History:  Past Medical History:  Diagnosis Date  . Asthma   . Hypertension   . Migraines   . Obesity   . Panic attacks   . Peritonitis ([employer]) 2014      ROS:   She is doing well.  Anxiety not well controlled, medication adjusted.    Assessment / Plan / Recommendations:   Please see A&P under problem oriented charting for assessment of the patient's acute and chronic medical conditions.   As always, pt is advised that if symptoms worsen or new symptoms arise, they should go to an urgent care facility or to to ER for further evaluation.   Consent and Medical Decision Making:   This is a telephone encounter between Stacey Price and Gilles Chiquito on 06/10/2019 for discussion of paperwork. The visit was  conducted with the patient located at home and Gilles Chiquito at [facility]. The patient's identity was confirmed using their DOB and current address. The patient has consented to being evaluated through a telephone encounter and understands the associated risks (an examination cannot be done and the patient may need to come in for an appointment) / benefits (allows the patient to remain at home, decreasing exposure to coronavirus). I personally spent 15 minutes on medical discussion.

## 2019-06-27 MED FILL — traZODone HCL 50 MG TABS: 50 | 90 days supply | Qty: 90 | Fill #0

## 2019-06-27 MED FILL — BUSPIRONE HCL 7.5 MG TABS: 7.5 | 30 days supply | Qty: 90 | Fill #0

## 2019-06-27 MED FILL — CITALOPRAM HBR 10 MG TABLET: 10 | 90 days supply | Qty: 90 | Fill #0

## 2019-06-27 MED FILL — hydrOXYzine HCL 25 MG TABS: 25 | 30 days supply | Qty: 90 | Fill #0

## 2019-07-01 ENCOUNTER — Other Ambulatory Visit: Payer: Self-pay | Admitting: Neurology

## 2019-07-01 ENCOUNTER — Telehealth: Payer: Self-pay | Admitting: Internal Medicine

## 2019-07-01 NOTE — Telephone Encounter (Signed)
RTC, pt states during her telehealth visit with PCP on 06/10/19, PCP offered to write a letter to her work requesting an extension of leave.  Pt states she has not started her Buspar yet "because she is waiting for Buspar from her mail order pharmacy".  Pt is requesting her leave be extended until 08/22/2019 and is requesting the letter in MyChart. Will forward to PCP SChaplin, RN,BSN

## 2019-07-01 NOTE — Telephone Encounter (Signed)
Pt is requesting a callback [phone] regarding a note over the holidays

## 2019-07-04 ENCOUNTER — Telehealth: Payer: Self-pay | Admitting: *Deleted

## 2019-07-04 NOTE — Telephone Encounter (Signed)
PA for Ajovy started via covermymeds (key: [id number]).  The patient has coverage with [employer].  Member XK#[id number].  Approved through 06/30/2020.

## 2019-07-07 MED FILL — AJOVY 225 MG/1.5ML SOSY: 225 | 30 days supply | Qty: 2 | Fill #0

## 2019-07-07 NOTE — Telephone Encounter (Signed)
Pt calling back to f/u with a Letter of extension for her job.

## 2019-07-07 NOTE — Telephone Encounter (Signed)
Return pt's call - stated she needs the letter by next Wednesday for leave either until March or at least by Feb. Still waiting for Buspar to be delivered.

## 2019-07-08 ENCOUNTER — Encounter: Payer: Self-pay | Admitting: Internal Medicine

## 2019-07-08 NOTE — Telephone Encounter (Signed)
Completed in My Chart

## 2019-07-12 ENCOUNTER — Encounter: Payer: Self-pay | Admitting: *Deleted

## 2019-07-12 MED FILL — hydrOXYzine HCL 25 MG TABS: 25 | 30 days supply | Qty: 90 | Fill #0

## 2019-07-12 MED FILL — BUSPIRONE HCL 7.5 MG TABS: 7.5 | 30 days supply | Qty: 90 | Fill #0

## 2019-07-20 DIAGNOSIS — F419 Anxiety disorder, unspecified: Secondary | ICD-10-CM | POA: Diagnosis not present

## 2019-07-20 DIAGNOSIS — F4310 Post-traumatic stress disorder, unspecified: Secondary | ICD-10-CM | POA: Diagnosis not present

## 2019-08-26 ENCOUNTER — Ambulatory Visit (INDEPENDENT_AMBULATORY_CARE_PROVIDER_SITE_OTHER): Payer: Self-pay | Admitting: Internal Medicine

## 2019-08-26 ENCOUNTER — Other Ambulatory Visit: Payer: Self-pay

## 2019-08-26 ENCOUNTER — Encounter: Payer: Self-pay | Admitting: Internal Medicine

## 2019-08-26 VITALS — BP 119/74 | HR 62 | Temp 98.0°F | Ht 67.0 in | Wt 272.0 lb

## 2019-08-26 DIAGNOSIS — G43909 Migraine, unspecified, not intractable, without status migrainosus: Secondary | ICD-10-CM

## 2019-08-26 DIAGNOSIS — R002 Palpitations: Secondary | ICD-10-CM

## 2019-08-26 DIAGNOSIS — F329 Major depressive disorder, single episode, unspecified: Secondary | ICD-10-CM

## 2019-08-26 DIAGNOSIS — J45909 Unspecified asthma, uncomplicated: Secondary | ICD-10-CM

## 2019-08-26 DIAGNOSIS — G43709 Chronic migraine without aura, not intractable, without status migrainosus: Secondary | ICD-10-CM

## 2019-08-26 DIAGNOSIS — F410 Panic disorder [episodic paroxysmal anxiety] without agoraphobia: Secondary | ICD-10-CM

## 2019-08-26 DIAGNOSIS — F419 Anxiety disorder, unspecified: Secondary | ICD-10-CM

## 2019-08-26 DIAGNOSIS — IMO0002 Reserved for concepts with insufficient information to code with codable children: Secondary | ICD-10-CM

## 2019-08-26 DIAGNOSIS — Z79899 Other long term (current) drug therapy: Secondary | ICD-10-CM

## 2019-08-26 DIAGNOSIS — F331 Major depressive disorder, recurrent, moderate: Secondary | ICD-10-CM

## 2019-08-26 DIAGNOSIS — Z91120 Patient's intentional underdosing of medication regimen due to financial hardship: Secondary | ICD-10-CM

## 2019-08-26 DIAGNOSIS — I10 Essential (primary) hypertension: Secondary | ICD-10-CM

## 2019-08-26 DIAGNOSIS — J4520 Mild intermittent asthma, uncomplicated: Secondary | ICD-10-CM

## 2019-08-26 MED ORDER — BUSPIRONE HCL 5 MG PO TABS: 5.0000 mg | ORAL_TABLET | Freq: Three times a day (TID) | ORAL | 3 refills | Status: DC

## 2019-08-26 MED ORDER — METOPROLOL SUCCINATE ER 25 MG PO TB24: 12.5000 mg | ORAL_TABLET | Freq: Every day | ORAL | 2 refills | Status: DC

## 2019-08-26 MED ORDER — TOPIRAMATE 50 MG PO TABS: 50.0000 mg | ORAL_TABLET | Freq: Two times a day (BID) | ORAL | 2 refills | Status: DC

## 2019-08-26 MED ORDER — ALBUTEROL SULFATE HFA 108 (90 BASE) MCG/ACT IN AERS: 1.0000 | INHALATION_SPRAY | Freq: Four times a day (QID) | RESPIRATORY_TRACT | 6 refills | Status: DC | PRN

## 2019-08-26 MED ORDER — DULOXETINE HCL 60 MG PO CPEP: 60.0000 mg | ORAL_CAPSULE | Freq: Every day | ORAL | 3 refills | Status: DC

## 2019-08-26 MED ORDER — FLUTICASONE-SALMETEROL 250-50 MCG/DOSE IN AEPB: 1.0000 | INHALATION_SPRAY | Freq: Two times a day (BID) | RESPIRATORY_TRACT | 6 refills | Status: DC

## 2019-08-26 MED FILL — ALBUTEROL SULFATE HFA 108 (: 108 (90 BAS | 25 days supply | Qty: 18 | Fill #0

## 2019-08-26 MED FILL — ADVAIR 250/50 DISKUS: 250-50 | 30 days supply | Qty: 60 | Fill #0

## 2019-08-26 MED FILL — busPIRone HCL 5 MG TABS: 5 | 30 days supply | Qty: 90 | Fill #0

## 2019-08-26 MED FILL — DULoxetine HCL 60 MG CPEP: 60 | 30 days supply | Qty: 30 | Fill #0

## 2019-08-26 MED FILL — TOPIRAMATE 50 MG TABLET: 50 | 30 days supply | Qty: 60 | Fill #0

## 2019-08-26 MED FILL — METOPROLOL SUCCINATE ER 25: 25 | 30 days supply | Qty: 15 | Fill #0

## 2019-08-26 NOTE — Patient Instructions (Signed)
Stacey Price - -  Thank you for coming in today.  Please go to the [facility] for your medications.   Please schedule a telehealth visit in 4 weeks.   Thank you!

## 2019-08-26 NOTE — Progress Notes (Signed)
   Subjective:    Patient ID: Stacey Price, female    DOB: 17-Mar-1976, 44 y.o.   MRN: [id number]  CC: 6 month follow up for HTN, asthma  HPI  Stacey Price is a 44 year old woman with PMH of HTN, Asthma, depression, anxiety who presents for follow up.  Stacey Price has had a challenging year.  Stacey Price has been out of work due to anxiety for a few months now.  Stacey Price has been following up with her mental health provider and psychiatrist.  However, because Stacey Price has been on FMLA Stacey Price has lost her insurance and not been able to get access to her medications for the last month at least.  Her BP, however, is well controlled today as is her pulse.  Stacey Price is requesting access to her anxiety medications so that Stacey Price can start back to work and a work note.  We reviewed her medications today.   Review of Systems  Constitutional: Negative for activity change, appetite change, fatigue and fever.  Respiratory: Positive for chest tightness. Negative for cough, shortness of breath and wheezing.   Cardiovascular: Positive for palpitations. Negative for chest pain and leg swelling.  Neurological: Negative for dizziness and light-headedness.  Psychiatric/Behavioral: Positive for agitation, decreased concentration, dysphoric mood and sleep disturbance. Negative for suicidal ideas. The patient is nervous/anxious.        Objective:   Physical Exam Vitals and nursing note reviewed.  Constitutional:      General: Stacey Price is not in acute distress.    Appearance: Normal appearance. Stacey Price is not toxic-appearing.  HENT:     Head: Normocephalic and atraumatic.  Pulmonary:     Effort: Pulmonary effort is normal. No respiratory distress.  Neurological:     Mental Status: Stacey Price is alert and oriented to person, place, and time. Mental status is at baseline.  Psychiatric:        Attention and Perception: Attention and perception normal.        Mood and Affect: Mood is depressed. Affect is blunt.        Speech: Speech normal.      Behavior: Behavior normal. Behavior is cooperative.     No blood work today.       Assessment & Plan:  Return in 1 month, telehealth acceptable.   I have placed a consult to our CCM group to help with SW needs and pharmacy assistance.  Stacey Price will need assistance affording her medication and getting insurance.  We discussed the possibility of changing jobs, as the job itself seems to be exacerbating her symptoms.  Stacey Price notes that the company is currently paying for her schooling and Stacey Price does not want to lose that opportunity.  Stacey Price would like to try and get stabilized on her medications and see if Stacey Price can return to work.    I also gave her a note to return in 1 month.  Stacey Price will get her medications and attempt to return at that time.  I advised her that Stacey Price should continue to see her psychiatrist as managing severe anxiety is outside the scope of this practice.

## 2019-08-30 NOTE — Assessment & Plan Note (Signed)
She has been out of her inhalers.  She has not had any severe issues, but is worried about missing doses.   Plan Will refill her long acting inhaler and albuterol.  These are on the low cost list at the pharmacy.

## 2019-08-30 NOTE — Assessment & Plan Note (Signed)
Unfortunately, she is not able to access her Ajovy ($800 per injection and $50 copay to see the neurologist).  We discussed starting back her topomax which we will do today.   Plan Restart Topomax 50mg  BID, can increase to 100mg  BID after 2-4 weeks.

## 2019-08-30 NOTE — Assessment & Plan Note (Signed)
She was previously on celexa 20mg  daily, hydroxyzine prn and buspar.  She was not able to start the Buspar due to her current financial situation.   Plan Start Duloxetine 60mg  daily Start Buspar 5mg  TID Unfortunately, we cannot access hydroxyzine through the [facility] OP pharmacy at this time.

## 2019-08-30 NOTE — Assessment & Plan Note (Signed)
BP was actually well controlled today.  She is on olmesartan and propranolol but has not had these for a month at least.   Reviewed low cost options at the [facility] OP pharmacy.  Metoprolol would be an acceptable option.  She is on propranolol for anxiety and migraine as well.   Plan Change to Metoprolol ER 12.5mg  daily.  She is to monitor for low pulse, lightheadedness, dizziness and call the clinic and stop the medication.

## 2019-09-13 ENCOUNTER — Ambulatory Visit: Payer: Medicaid Other | Admitting: *Deleted

## 2019-09-13 ENCOUNTER — Ambulatory Visit: Payer: Medicaid Other

## 2019-09-13 DIAGNOSIS — I10 Essential (primary) hypertension: Secondary | ICD-10-CM

## 2019-09-13 DIAGNOSIS — J4520 Mild intermittent asthma, uncomplicated: Secondary | ICD-10-CM

## 2019-09-13 DIAGNOSIS — F419 Anxiety disorder, unspecified: Secondary | ICD-10-CM

## 2019-09-13 NOTE — Chronic Care Management (AMB) (Signed)
Care Management   Initial Visit Note  09/13/2019 Name: Stacey Price MRN: [id number] DOB: 25-Mar-1976   Objective:  BP Readings from Last 3 Encounters:  08/26/19 119/74  04/29/19 133/76  04/01/19 120/71    Assessment: Stacey Price is a 44 y.o. year old female who sees [name], MD for primary care. The care management team was consulted for assistance with care management and care coordination needs related to Disease Management, Care Coordination, Medication Assistance and Mental Health Counseling.   Review of patient status, including review of consultants reports, relevant laboratory and other test results, and collaboration with appropriate care team members and the patient's provider was performed as part of comprehensive patient evaluation and provision of care management services.    SDOH (Social Determinants of Health) assessments performed: No See Care Plan activities for detailed interventions related to [facility])     Outpatient Encounter Medications as of 09/13/2019  Medication Sig  . AJOVY 225 MG/1.5ML SOSY INJECT 225 MG INTO THE SKIN EVERY 30 DAYS.  [name] albuterol (VENTOLIN HFA) 108 (90 Base) MCG/ACT inhaler Inhale 1-2 puffs into the lungs every 6 (six) hours as needed for wheezing or shortness of breath.  . busPIRone (BUSPAR) 5 MG tablet Take 1 tablet (5 mg total) by mouth 3 (three) times daily.  [name] dicyclomine (BENTYL) 20 MG tablet Take 1 tablet (20 mg total) by mouth 3 times/day as needed-between meals & bedtime (Abdominal pain).  . DULoxetine (CYMBALTA) 60 MG capsule Take 1 capsule (60 mg total) by mouth daily.  . Fluticasone-Salmeterol (ADVAIR) 250-50 MCG/DOSE AEPB Inhale 1 puff into the lungs 2 (two) times daily. IM Program  . metoprolol succinate (TOPROL-XL) 25 MG 24 hr tablet Take 0.5 tablets (12.5 mg total) by mouth daily.  [name] topiramate (TOPAMAX) 50 MG tablet Take 1 tablet (50 mg total) by mouth 2 (two) times daily.   No facility-administered encounter  medications on file as of 09/13/2019.    Goals Addressed            This Visit's Progress     Patient Stated   . "I told my doctor and the social worker I cannot afford my medications." (pt-stated)       CARE PLAN ENTRY (see longitudinal plan of care for additional care plan information)   Current Barriers:  . Chronic Disease Management support, education, and care coordination needs related to HTN and Asthma  Case Manager Clinical Goal(s):  [name] Over the next  days, patient will work with BSW to address needs related to Financial constraints related to procuring medications and affording rent  in patient with HTN and asthma  Interventions:  . Collaborated with BSW to initiate plan of care to address needs related to Financial constraints related to procuring medications and affording rent  in patient with HTN and asthma.  Patient Self Care Activities:  . Patient verbalizes understanding of plan to work with care management team to  address her financial concerns related to rent and medications.   Initial goal documentation         Follow up plan:  The care management team will reach out to the patient again over the next 14 days.   [name] was given information about Care Management services today including:  1. Care Management services include personalized support from designated clinical staff supervised by a physician, including individualized plan of care and coordination with other care providers 2. 24/7 contact phone numbers for assistance for urgent and routine care needs. 3. The  patient may stop Care Management services at any time (effective at the end of the month) by phone call to the office staff.  Patient agreed to services and verbal consent obtained.  Cranford Mon RN, CCM, CDCES CCM Clinic RN Care Manager [phone]

## 2019-09-13 NOTE — Patient Instructions (Signed)
Visit Information  Goals Addressed            This Visit's Progress   . "I can't afford my migraine medication" (pt-stated)       Current Barriers:  [name] Knowledge Barriers related to resources and support available to address Medication procurement  Case Manager Clinical Goal(s):  [name] Over the next 30 days, patient will work with BSW to address needs related to Medication procurement . Over the next 30 days, BSW will collaborate with RN Care Manager to address care management and care coordination needs  Interventions:  . Patient interviewed and appropriate assessments performed . Discussed plans with patient for ongoing care management follow up and provided patient with direct contact information for care management team . Collaborated with RN Care Manager regarding possible assistance programs for cost of Ajovy.  Patient likely not eligible due to having Medicaid. . Provided patient with contact information for [employer] Patient Assistance Program for Ajovy and encouraged her to call regarding eligibility.   Steele Sizer with RN Care Manager and patient to establish an individualized plan of care . Collaboration with RN Case Manager   Patient Self Care Activities:  . Self administers medications as prescribed . Attends all scheduled provider appointments . Calls pharmacy for medication refills  Initial goal documentation     . "I sometimes struggle to pay my rent." (pt-stated)       Current Barriers:  [name] Knowledge Barriers related to resources available to address financial constraints/rental assistance  Case Manager Clinical Goal(s):  [name] Over the next 30 days, patient will work with BSW to address needs related to financial constraints  . Over the next 30 days, BSW will collaborate with RN Care Manager to address care management and care coordination needs  Interventions:  . Patient interviewed and appropriate assessments performed . Patient provided with contact  information for the following agencies that may be able to provide financial assistance:  [employer], [place] [address], [employer], [employer] [place] [employer], [employer], [employer], . . Referrals submitted to [facility] and [employer] via [employer] [place] platform . Collaborated with RN Care Manager and patient to establish an individualized plan of care  Patient Self Care Activities:  . Self administers medications as prescribed . Attends all scheduled provider appointments . Calls pharmacy for medication refills . Calls provider office for new concerns or questions  Initial goal documentation        Stacey Price was given information about Care Management services today including:  1. Care Management services include personalized support from designated clinical staff supervised by her physician, including individualized plan of care and coordination with other care providers 2. 24/7 contact phone numbers for assistance for urgent and routine care needs. 3. The patient may stop CCM services at any time (effective at the end of the month) by phone call to the office staff.  Patient agreed to services and verbal consent obtained.   Patient verbalizes understanding of instructions provided today.   The care management team will reach out to the patient again over the next 14 days.      Malachy Chamber, BSW Embedded Care Coordination Social Worker [facility] [phone]

## 2019-09-13 NOTE — Progress Notes (Signed)
Internal Medicine Clinic Resident  I have personally reviewed this encounter including the documentation in this note and/or discussed this patient with the care management provider. I will address any urgent items identified by the care management provider and will communicate my actions to the patient's PCP. I have reviewed the patient's CCM visit with my supervising attending.  Newton Frutiger, MD 09/13/2019    

## 2019-09-13 NOTE — Chronic Care Management (AMB) (Signed)
Chronic Care Management    Clinical Social Work General Note  09/13/2019 Name: Stacey Price MRN: [id number] DOB: 10-29-1975  Stacey Price is a 44 y.o. year old female who is a primary care patient of [name], MD. The CCM was consulted to assist the patient with Financial Difficulties related to cost of medications.  .   Stacey Price was given information about Chronic Care Management services today including:  1. CCM service includes personalized support from designated clinical staff supervised by her physician, including individualized plan of care and coordination with other care providers 2. 24/7 contact phone numbers for assistance for urgent and routine care needs. 3. Service will only be billed when office clinical staff spend 20 minutes or more in a month to coordinate care. 4. Only one practitioner may furnish and bill the service in a calendar month. 5. The patient may stop CCM services at any time (effective at the end of the month) by phone call to the office staff. 6. The patient will be responsible for cost sharing (co-pay) of up to 20% of the service fee (after annual deductible is met).  Patient agreed to services and verbal consent obtained.   Review of patient status, including review of consultants reports, relevant laboratory and other test results, and collaboration with appropriate care team members and the patient's provider was performed as part of comprehensive patient evaluation and provision of chronic care management services.    SDOH (Social Determinants of Health) assessments and interventions performed:  Yes SDOH Interventions     Most Recent Value  SDOH Interventions  SDOH Interventions for the Following Domains  Financial Strain, Food Insecurity, Housing, Customer service manager Strain Interventions  -- Field seismologist provided with multiple resources for possible financial assistance]  Housing Interventions  -- [Patient provided with multiple resources  for possible rent assistance]       Outpatient Encounter Medications as of 09/13/2019  Medication Sig  . AJOVY 225 MG/1.5ML SOSY INJECT 225 MG INTO THE SKIN EVERY 30 DAYS.  [name] albuterol (VENTOLIN HFA) 108 (90 Base) MCG/ACT inhaler Inhale 1-2 puffs into the lungs every 6 (six) hours as needed for wheezing or shortness of breath.  . busPIRone (BUSPAR) 5 MG tablet Take 1 tablet (5 mg total) by mouth 3 (three) times daily.  [name] dicyclomine (BENTYL) 20 MG tablet Take 1 tablet (20 mg total) by mouth 3 times/day as needed-between meals & bedtime (Abdominal pain).  . DULoxetine (CYMBALTA) 60 MG capsule Take 1 capsule (60 mg total) by mouth daily.  . Fluticasone-Salmeterol (ADVAIR) 250-50 MCG/DOSE AEPB Inhale 1 puff into the lungs 2 (two) times daily. IM Program  . metoprolol succinate (TOPROL-XL) 25 MG 24 hr tablet Take 0.5 tablets (12.5 mg total) by mouth daily.  [name] topiramate (TOPAMAX) 50 MG tablet Take 1 tablet (50 mg total) by mouth 2 (two) times daily.   No facility-administered encounter medications on file as of 09/13/2019.    Goals Addressed            This Visit's Progress   . "I can't afford my migraine medication" (pt-stated)       Current Barriers:  [name] Knowledge Barriers related to resources and support available to address Medication procurement  Case Manager Clinical Goal(s):  [name] Over the next 30 days, patient will work with BSW to address needs related to Medication procurement . Over the next 30 days, BSW will collaborate with RN Care Manager to address care management and care coordination needs  Interventions:  .  Patient interviewed and appropriate assessments performed . Discussed plans with patient for ongoing care management follow up and provided patient with direct contact information for care management team . Collaborated with RN Care Manager regarding possible assistance programs for cost of Ajovy.  Patient likely not eligible due to having Medicaid. . Provided patient  with contact information for [employer] Patient Assistance Program for Ajovy and encouraged her to call regarding eligibility.   . Collaborated with RN Care Manager and patient to establish an individualized plan of care . Collaboration with RN Case Manager   Patient Self Care Activities:  . Self administers medications as prescribed . Attends all scheduled provider appointments . Calls pharmacy for medication refills  Initial goal documentation     . "I sometimes struggle to pay my rent." (pt-stated)       Current Barriers:  . Knowledge Barriers related to resources available to address financial constraints/rental assistance  Case Manager Clinical Goal(s):  . Over the next 30 days, patient will work with BSW to address needs related to financial constraints  . Over the next 30 days, BSW will collaborate with RN Care Manager to address care management and care coordination needs  Interventions:  . Patient interviewed and appropriate assessments performed . Patient provided with contact information for the following agencies that may be able to provide financial assistance:  [employer], [place] [address], [place] Emergency Assistance Program, [employer], [employer], [employer], . . Referrals submitted to [employer] and [employer] via [employer] platform . Collaborated with RN Care Manager and patient to establish an individualized plan of care  Patient Self Care Activities:  . Self administers medications as prescribed . Attends all scheduled provider appointments . Calls pharmacy for medication refills . Calls provider office for new concerns or questions  Initial goal documentation         Follow Up Plan: SW will follow up with patient by phone over the next 14 days          Stacey Price, BSW Embedded Care Coordination Social Worker [facility] [phone]                              

## 2019-09-13 NOTE — Progress Notes (Signed)
Internal Medicine Clinic Resident  I have personally reviewed this encounter including the documentation in this note and/or discussed this patient with the care management provider. I will address any urgent items identified by the care management provider and will communicate my actions to the patient's PCP. I have reviewed the patient's CCM visit with my supervising attending.  Sunya Humbarger, MD 09/13/2019    

## 2019-09-13 NOTE — Patient Instructions (Signed)
Visit Information  Goals Addressed            This Visit's Progress     Patient Stated   . "I told my doctor and the social worker I cannot afford my medications." (pt-stated)       CARE PLAN ENTRY (see longitudinal plan of care for additional care plan information)   Current Barriers:  . Chronic Disease Management support, education, and care coordination needs related to HTN and Asthma  Case Manager Clinical Goal(s):  [name] Over the next  days, patient will work with BSW to address needs related to Financial constraints related to procuring medications and affording rent  in patient with HTN and asthma  Interventions:  . Collaborated with BSW to initiate plan of care to address needs related to Financial constraints related to procuring medications and affording rent  in patient with HTN and asthma.  Patient Self Care Activities:  . Patient verbalizes understanding of plan to work with care management team to  address her financial concerns related to rent and medications.   Initial goal documentation        Stacey Price was given information about Care Management services today including:  1. Care Management services include personalized support from designated clinical staff supervised by her physician, including individualized plan of care and coordination with other care providers 2. 24/7 contact phone numbers for assistance for urgent and routine care needs. 3. The patient may stop CCM services at any time (effective at the end of the month) by phone call to the office staff.  Patient agreed to services and verbal consent obtained.   The patient verbalized understanding of instructions provided today and declined a print copy of patient instruction materials.   The care management team will reach out to the patient again over the next 14 days.   Cranford Mon RN, CCM, CDCES CCM Clinic RN Care Manager [phone]

## 2019-09-14 ENCOUNTER — Telehealth: Payer: Self-pay | Admitting: Neurology

## 2019-09-14 NOTE — Progress Notes (Signed)
Internal Medicine Clinic Attending  CCM services provided by the care management provider and their documentation were reviewed with Dr. Harbrecht.  We reviewed the pertinent findings, urgent action items addressed by the resident and non-urgent items to be addressed by the PCP.  I agree with the assessment, diagnosis, and plan of care documented in the CCM and resident's note.  Alexander N Raines, MD 09/14/2019  

## 2019-09-14 NOTE — Telephone Encounter (Signed)
Pt states she is unable to afford the cost for Lyrica, she would like a call from RN to discuss another medication or if there is assistance available to her for Lyrica. Pt declined an office visit at this time due to no insurance. Please call

## 2019-09-14 NOTE — Telephone Encounter (Addendum)
Correction to note below - the medication is Ajovy not Lyrica.  I returned the call to the patient. She has lost her insurance but hopes to get it back in the next six months or so.  She is unable to afford Ajovy. I provided her with the following resource:  The [employer] Patient Assistance Program offers financial assistance to uninsured and underinsured patients with lower incomes. Income eligibility can cover households earning up to 500% of the Federal Poverty Level (about $60,000 for singles or up to $125,000 for a family of four). Speak to a [employer] representative at [phone] to determine if you are eligible. The call center is open Monday-Friday, 9 a.m. - 9 p.m. ET.  She is going to contact [employer] to see if she qualifies for assistance. She was last seen 09/2018 and is unable to follow up at this time due to lack of insurance. She is still seeing her PCP. If she is able to stay on Ajovy, she is going to ask her PCP to take over refills. If not, she is going to ask her to address a therapy change.

## 2019-09-14 NOTE — Progress Notes (Signed)
Internal Medicine Clinic Attending  CCM services provided by the care management provider and their documentation were discussed with Dr. Crista Elliot at the time of the visit.  We reviewed the pertinent findings, urgent action items addressed by the resident and non-urgent items to be addressed by the PCP.  I agree with the assessment, diagnosis, and plan of care documented in the CCM and resident's note.  Debe Coder, MD 09/14/2019

## 2019-09-20 ENCOUNTER — Ambulatory Visit: Payer: Medicaid Other | Admitting: *Deleted

## 2019-09-20 DIAGNOSIS — IMO0002 Reserved for concepts with insufficient information to code with codable children: Secondary | ICD-10-CM

## 2019-09-20 DIAGNOSIS — G43709 Chronic migraine without aura, not intractable, without status migrainosus: Secondary | ICD-10-CM

## 2019-09-20 DIAGNOSIS — I10 Essential (primary) hypertension: Secondary | ICD-10-CM

## 2019-09-20 DIAGNOSIS — J4520 Mild intermittent asthma, uncomplicated: Secondary | ICD-10-CM

## 2019-09-20 NOTE — Progress Notes (Signed)
Internal Medicine Clinic Attending  CCM services provided by the care management provider and their documentation were discussed with Dr. Melvin. We reviewed the pertinent findings, urgent action items addressed by the resident and non-urgent items to be addressed by the PCP.  I agree with the assessment, diagnosis, and plan of care documented in the CCM and resident's note.  Carlie Corpus, MD 09/20/2019  

## 2019-09-20 NOTE — Progress Notes (Signed)
Internal Medicine Clinic Resident   I have personally reviewed this encounter including the documentation in this note and/or discussed this patient with the care management provider. I will address any urgent items identified by the care management provider and will communicate my actions to the patient's PCP. I have reviewed the patient's CCM visit with my supervising attending.  Imonie Tuch, MD 09/20/2019   

## 2019-09-20 NOTE — Patient Instructions (Signed)
Visit Information It was nice speaking with you today. Please call the number of the medication patient assistance programs that were provide to you.  Please review the educational material I mailed to you and we will discuss on the follow up call next month.  Goals Addressed            This Visit's Progress     Patient Stated   . "I told my doctor and the social worker I cannot afford my medications." (pt-stated)       CARE PLAN ENTRY (see longitudinal plan of care for additional care plan information)   Current Barriers:  . Chronic Disease Management support, education, and care coordination needs related to HTN and Asthma and migraines- patient states the only insurance she has is [employer] and it covers preventive family planning care but not the cost of medications for her chronic disease states. She says she hasn't had her monthly Ajovy injections for 4 months. She says she currently has enough of her maintenance medications for HTN and Asthma but will need help with ongoing supplies of all medications.  She says she does not monitor her blood pressure at home. She is able to identify some migraine and most of her asthma triggers. She reports she has required her rescue inhaler once a week for the past 2 weeks.   Case Manager Clinical Goal(s):  [name] Over the next  days, patient will work with BSW to address needs related to Financial constraints related to procuring medications and affording rent  in patient with HTN and asthma  and migraines  . Nurse Case Manager Clinical Goal(s):  [name] Over the next 30 days, patient will verbalize understanding of plan for contacting appropriate patient assistance programs for ongoing medication assistance while she is uninsured . Over the next 30 days, patient will attend all scheduled medical appointments:   . Interventions:  . Evaluation of current treatment plan related to HTN, Asthma and migraines and patient's adherence to plan as  established by provider. . Advised patient to contact [employer] about Anjovy patient assistance program, ask pharmacists at [facility] OP pharmacy about medication assistance/coupons for her blood pressure and asthma medications, and to call the [facility] Department of Public Health Medication Assistance Program (MAP) for ongoing assistance with maintenance medications while she is uninsured. Mailed patient written information and contact number for the MAP program.  . Reviewed medications with patient and discussed adverse effects of using stimulant laxatives on a regular basis, suggested she try generic Miralax instead . Discussed plans with patient for ongoing care management follow up and provided patient with direct contact information for care management team . Advised patient, providing education and rationale, to monitor blood pressure at local pharmacies and to call her primary care provider for findings outside established parameters.  . Provided patient with verbal and written Emmi educational materials related to Asthma Action Plan, Asthma Dusting Your House, Asthma- Daily Medications and Rescue Medicine, How to Keep Track of Your Headaches, Home Headache Remedies, and Migraines in Adults. . Reviewed scheduled/upcoming provider appointments including: telehealth visit with Dr Criselda Peaches on 09/22/19  . Patient Self Care Activities:  . Patient verbalizes understanding of plan to contact medication assistance programs and/or pharmacists for ongoing assistance with maintenance medications   . Self administers medications as prescribed . Attends all scheduled provider appointments . Calls pharmacy for medication refills . Calls provider office for new concerns or questions . Unable to independently obtain maintenance medications while  uninsured  Please see past updates related to this goal by clicking on the "Past Updates" button in the selected goal    I       The patient  verbalized understanding of instructions provided today and declined a print copy of patient instruction materials.   The care management team will reach out to the patient again over the next 30 days.   [name] RN, CCM, [facility] RN Care Manager [phone]

## 2019-09-20 NOTE — Chronic Care Management (AMB) (Signed)
Care Management   Follow Up Note   09/20/2019 Name: Stacey Price MRN: [id number] DOB: August 30, 1975  Referred by: Inez Catalina, MD Reason for referral : Care Coordination (HTN, Asthma, migraines)   Stacey Price is a 44 y.o. year old female who is a primary care patient of Inez Catalina, MD. The care management team was consulted for assistance with care management and care coordination needs.    Review of patient status, including review of consultants reports, relevant laboratory and other test results, and collaboration with appropriate care team members and the patient's provider was performed as part of comprehensive patient evaluation and provision of chronic care management services.    SDOH (Social Determinants of Health) assessments performed: No See Care Plan activities for detailed interventions related to [facility])     Advanced Directives: See Care Plan and Vynca application for related entries.   BP Readings from Last 3 Encounters:  08/26/19 119/74  04/29/19 133/76  04/01/19 120/71    Goals Addressed            This Visit's Progress     Patient Stated   . "I told my doctor and the social worker I cannot afford my medications." (pt-stated)       CARE PLAN ENTRY (see longitudinal plan of care for additional care plan information)   Current Barriers:  . Chronic Disease Management support, education, and care coordination needs related to HTN and Asthma and migraines- patient states the only insurance she has is [employer] and it covers preventive family planning care but not the cost of medications for her chronic disease states. She says she hasn't had her monthly Ajovy injections for 4 months. She says she currently has enough of her maintenance medications for HTN and Asthma but will need help with ongoing supplies of all medications.  She says she does not monitor her blood pressure at home. She is able to identify some migraine and most of her  asthma triggers. She reports she has required her rescue inhaler once a week for the past 2 weeks.   Case Manager Clinical Goal(s):  [name] Over the next  days, patient will work with BSW to address needs related to Financial constraints related to procuring medications and affording rent  in patient with HTN and asthma  and migraines  . Nurse Case Manager Clinical Goal(s):  [name] Over the next 30 days, patient will verbalize understanding of plan for contacting appropriate patient assistance programs for ongoing medication assistance while she is uninsured . Over the next 30 days, patient will attend all scheduled medical appointments:   . Interventions:  . Evaluation of current treatment plan related to HTN, Asthma and migraines and patient's adherence to plan as established by provider. Advised patient to contact [employer] about Anjovy patient assistance program, ask pharmacists at [facility] OP pharmacy about medication assistance/coupons for her blood pressure and asthma medications, and to call the [facility] Department of Public Health Medication Assistance Program for ongoing assistance with maintenance medications while she is uninsured.Mailed patient written information and contact number for the MAP program.  . Reviewed medications with patient and discussed adverse effects of using stimulant laxatives on a regular basis, suggested she try generic Miralax instead . Discussed plans with patient for ongoing care management follow up and provided patient with direct contact information for care management team . Advised patient, providing education and rationale, to monitor blood pressure at local pharmacies and to call her primary care provider  for findings outside established parameters.  . Provided patient with verbal and written Emmi educational materials related to Asthma Action Plan, Asthma Dusting Your House, Asthma- Daily Medications and Rescue Medicine, How to Keep Track of Your  Headaches, Home Headache Remedies, and Migraines in Adults. . Reviewed scheduled/upcoming provider appointments including: telehealth visit with Dr Daryll Drown on 09/22/19  . Patient Self Care Activities:  . Patient verbalizes understanding of plan to contact medication assistance programs and/or pharmacists for ongoing assistance with maintenance medications   . Self administers medications as prescribed . Attends all scheduled provider appointments . Calls pharmacy for medication refills . Calls provider office for new concerns or questions . Unable to independently obtain maintenance medications while uninsured  Please see past updates related to this goal by clicking on the "Past Updates" button in the selected goal    I        The care management team will reach out to the patient again over the next 30 days.   [name] RN, CCM, [facility] RN Care Manager [phone]

## 2019-09-22 ENCOUNTER — Encounter: Payer: Self-pay | Admitting: Internal Medicine

## 2019-09-22 ENCOUNTER — Ambulatory Visit (INDEPENDENT_AMBULATORY_CARE_PROVIDER_SITE_OTHER): Payer: Self-pay | Admitting: Internal Medicine

## 2019-09-22 ENCOUNTER — Other Ambulatory Visit: Payer: Self-pay

## 2019-09-22 DIAGNOSIS — F419 Anxiety disorder, unspecified: Secondary | ICD-10-CM

## 2019-09-22 DIAGNOSIS — IMO0002 Reserved for concepts with insufficient information to code with codable children: Secondary | ICD-10-CM

## 2019-09-22 DIAGNOSIS — G43709 Chronic migraine without aura, not intractable, without status migrainosus: Secondary | ICD-10-CM

## 2019-09-22 DIAGNOSIS — F410 Panic disorder [episodic paroxysmal anxiety] without agoraphobia: Secondary | ICD-10-CM

## 2019-09-22 DIAGNOSIS — Z79899 Other long term (current) drug therapy: Secondary | ICD-10-CM

## 2019-09-22 DIAGNOSIS — I10 Essential (primary) hypertension: Secondary | ICD-10-CM

## 2019-09-22 MED ORDER — BUSPIRONE HCL 5 MG PO TABS: 10.0000 mg | ORAL_TABLET | Freq: Three times a day (TID) | ORAL | 3 refills | Status: DC

## 2019-09-22 MED ORDER — TOPIRAMATE 50 MG PO TABS: 100.0000 mg | ORAL_TABLET | Freq: Two times a day (BID) | ORAL | 0 refills | Status: DC

## 2019-09-22 MED ORDER — TOPIRAMATE 50 MG PO TABS: 100.0000 mg | ORAL_TABLET | Freq: Two times a day (BID) | ORAL | 2 refills | Status: DC

## 2019-09-22 MED ORDER — BUSPIRONE HCL 5 MG PO TABS: 10.0000 mg | ORAL_TABLET | Freq: Three times a day (TID) | ORAL | 0 refills | Status: AC

## 2019-09-22 NOTE — Progress Notes (Signed)
  [facility] Internal Medicine Residency Telephone Encounter Continuity Care Appointment  HPI:   This telephone encounter was created for [name] on 09/22/2019 for the following purpose/cc anxiety, chronic migraines.   Past Medical History:  Past Medical History:  Diagnosis Date  . Asthma   . Hypertension   . Migraines   . Obesity   . Panic attacks   . Peritonitis (HCC) 2014      ROS:   Negative for syncope, focal weakness, n/v, SI/HI   Assessment / Plan / Recommendations:   Please see A&P under problem oriented charting for assessment of the patient's acute and chronic medical conditions.   As always, pt is advised that if symptoms worsen or new symptoms arise, they should go to an urgent care facility or to to ER for further evaluation.   Consent and Medical Decision Making:   Patient discussed with Dr. Oswaldo Done  This is a telephone encounter between Stacey Price and Bridget Hartshorn on 09/22/2019 for chronic migraines, anxiety. The visit was conducted with the patient located at home and Bridget Hartshorn at [facility]. The patient's identity was confirmed using their DOB and current address. The patient has consented to being evaluated through a telephone encounter and understands the associated risks (an examination cannot be done and the patient may need to come in for an appointment) / benefits (allows the patient to remain at home, decreasing exposure to coronavirus). I personally spent 19 minutes on medical discussion.

## 2019-09-22 NOTE — Assessment & Plan Note (Signed)
Stacey Price has been working with CCM to try and get her Ajovy. I am not sure she will able to get assistance since she has medicaid. She was started back on Topamax 50 mg BID, but is still having heavy symptom burden with daily headaches. We discussed increasing the Topamax to 100 mg BID which is what she was previously on according to last neurology note.  I also reached out to her neurologist to see if either Aimovig or Emgality which are on the Medicaid preferred list would be feasible options for her.

## 2019-09-22 NOTE — Assessment & Plan Note (Signed)
Ms. Dillow tolerated change to Metoprolol ER 12.5 mg daily. Denies any issues with low pulse, lightheadedness. Will continue current therapy.

## 2019-09-22 NOTE — Progress Notes (Signed)
Internal Medicine Clinic Attending  Case discussed with Dr. Bloomfield  at the time of the visit.  We reviewed the resident's history and pertinent patient test results.  I agree with the assessment, diagnosis, and plan of care documented in the resident's note.  

## 2019-09-22 NOTE — Assessment & Plan Note (Signed)
Patient has been compliant with Duloxetine 60 mg daily and Buspar 5 mg TID that was initiated at last visit. She states since starting this regimen, she feels more "on edge" than previously. She may end up having to switch off of SNRI, but will try titrating Buspar up to 10 mg TID to see if this controls her anxiety symptoms more appropriately. She also stated she needed another work letter which I sent today.  Follow-up telehealth in 4 week to see if these medication changes have made any difference.

## 2019-09-27 ENCOUNTER — Ambulatory Visit: Payer: Self-pay

## 2019-09-27 ENCOUNTER — Telehealth: Payer: Medicaid Other

## 2019-09-27 ENCOUNTER — Telehealth: Payer: Self-pay | Admitting: *Deleted

## 2019-09-27 NOTE — Telephone Encounter (Signed)
Agree with plan 

## 2019-09-27 NOTE — Chronic Care Management (AMB) (Signed)
  Chronic Care Management   Outreach Note  09/27/2019 Name: Stacey Price MRN: [id number] DOB: Aug 26, 1975  Referred by: Inez Catalina, MD Reason for referral : Care Coordination (Financial Constraints, Psychiatric resources)   An unsuccessful telephone outreach was attempted today. The patient was referred to the case management team for assistance with care management and care coordination.   Follow Up Plan: A HIPPA compliant phone message was left for the patient providing contact information and requesting a return call.        Malachy Chamber, BSW Embedded Care Coordination Social Worker [facility] [phone]

## 2019-09-27 NOTE — Telephone Encounter (Signed)
Patient called in requesting PCP go to [email or web address] and complete Designer[employer] fashion/clothing form. This will enable patient to receive med assistance from the manufacturer. After speaking with PCP, patient informed she will need to request this be done by her neurologist as he is the prescriber of this medication. Kinnie Feil, BSN, RN-BC

## 2019-09-28 ENCOUNTER — Ambulatory Visit: Payer: Self-pay

## 2019-09-28 NOTE — Chronic Care Management (AMB) (Signed)
  Chronic Care Management   Outreach Note  09/28/2019 Name: Stacey Price MRN: [id number] DOB: 1975/09/23  Referred by: Inez Catalina, MD Reason for referral : Care Coordination (Financial resources, Psychiatric resources )    Received voicemail from patient in response to message left for her on 09/27/19.   Attempted to call her back but had to leave another message.    Follow Up Plan: Telephone follow up appointment with care management team member scheduled for:10/04/19      Malachy Chamber, BSW Embedded Care Coordination Social Worker [facility] [phone]

## 2019-10-04 ENCOUNTER — Ambulatory Visit: Payer: Self-pay

## 2019-10-04 ENCOUNTER — Telehealth: Payer: Medicaid Other

## 2019-10-04 NOTE — Chronic Care Management (AMB) (Signed)
  Chronic Care Management   Outreach Note  10/04/2019 Name: Stacey Price MRN: [id number] DOB: 02/23/1976  Referred by: Inez Catalina, MD Reason for referral : Care Coordination (Financial & psychiatric resources )   A second unsuccessful telephone outreach was attempted today. The patient was referred to the case management team for assistance with care management and care coordination.   Follow Up Plan: A HIPPA compliant phone message was left for the patient providing contact information and requesting a return call.    Malachy Chamber, BSW Embedded Care Coordination Social Worker [facility] [phone]

## 2019-10-05 ENCOUNTER — Ambulatory Visit: Payer: Medicaid Other | Admitting: *Deleted

## 2019-10-05 DIAGNOSIS — G43709 Chronic migraine without aura, not intractable, without status migrainosus: Secondary | ICD-10-CM

## 2019-10-05 DIAGNOSIS — I10 Essential (primary) hypertension: Secondary | ICD-10-CM

## 2019-10-05 DIAGNOSIS — IMO0002 Reserved for concepts with insufficient information to code with codable children: Secondary | ICD-10-CM

## 2019-10-05 DIAGNOSIS — J4520 Mild intermittent asthma, uncomplicated: Secondary | ICD-10-CM

## 2019-10-05 NOTE — Telephone Encounter (Signed)
The patient no longer has insurance and has no plans to follow up at our practice at this time. She is only under the care of her PCP for now. I returned the call to let her know that her PCP will need to take over the refills for her medications and also address her patient assistance needs. She verbalized understanding.

## 2019-10-05 NOTE — Progress Notes (Signed)
Internal Medicine Clinic Attending  CCM services provided by the care management provider and their documentation were discussed with Dr. Jones. We reviewed the pertinent findings, urgent action items addressed by the resident and non-urgent items to be addressed by the PCP.  I agree with the assessment, diagnosis, and plan of care documented in the CCM and resident's note.  Sai Moura C Jordyan Hardiman, DO 10/05/2019  

## 2019-10-05 NOTE — Progress Notes (Signed)
Internal Medicine Clinic Resident  I have personally reviewed this encounter including the documentation in this note and/or discussed this patient with the care management provider. I will address any urgent items identified by the care management provider and will communicate my actions to the patient's PCP. I have reviewed the patient's CCM visit with my supervising attending.  Jacarius Handel, MD 10/05/2019    

## 2019-10-05 NOTE — Patient Instructions (Signed)
Visit Information It was nice speaking with you today. I am sorry for recent loss of your family members and your best friend's parents' health crisis. I will contact Dr Daryll Drown to see if she will manage your migraines until you can resume care with your neurologist.   Goals Addressed            This Visit's Progress     Patient Stated   . "I told my doctor and the social worker I cannot afford my medications." (pt-stated)       CARE PLAN ENTRY (see longitudinal plan of care for additional care plan information)   Current Barriers:  . Chronic Disease Management support, education, and care coordination needs related to HTN and Asthma and migraines- patient states she has had several deaths in her family and her best friends parents are in a health crisis so she has been experiencing increased migraines, she says she called her neurologist's office (Dr Krista Blue) to ask her to complete the [facility] healthcare professional form  so that she can apply for financial assistance for Ajovy . The neurologist office told her she will need to be seen but patient cannot afford to see a specialist as she is currently uninsured. The neurologist suggested she contact her primary care provider and ask if she will manage her migraines and assist patient with applying for [place] assistance until she can resume care with her neurologist.        Patient denies recent asthma exacerbation.       She says she has not yet contacted the Medication Assistance Program to see if she can receive help with her other maintenance medications.   Case Manager Clinical Goal(s):  [name] Over the next  days, patient will work with BSW to address needs related to Financial constraints related to procuring medications and affording rent  in patient with HTN and asthma  and migraines  . Nurse Case Manager Clinical Goal(s):  [name] Over the next 30 days, patient will verbalize understanding of plan for contacting appropriate patient assistance  programs for ongoing medication assistance while she is uninsured . Over the next 30 days, patient will attend all scheduled medical appointments:   . Interventions:  . Evaluation of current treatment plan related to HTN, Asthma and migraines and patient's adherence to plan as established by provider. . Advised patient this CCM RN will message Dr. Daryll Drown to see if she will manage patient's migraines and complete [employer] healthcare professional form to apply for Ajovy patient assistance program,  . Reviewed request for patient to ask pharmacists at [facility] about medication assistance/coupons for her blood pressure and asthma medications, and to call the [place] (MAP) for ongoing assistance with maintenance medications while she is uninsured. Verified with patient that she received the written information and contact number for the MAP program in the mail  . Discussed plans with patient for ongoing care management follow up and provided patient with direct contact information for care management team . Advised patient, providing education and rationale, to monitor blood pressure at local pharmacies and to call her primary care provider for findings outside established parameters.  . Ensured patient received written Emmi educational materials in mail and asked if she had any questions   . Patient Self Care Activities:  . Patient verbalizes understanding of plan to contact medication assistance programs and/or pharmacists for ongoing assistance with maintenance medications   . Self administers medications as  prescribed . Attends all scheduled provider appointments . Calls pharmacy for medication refills . Calls provider office for new concerns or questions . Unable to independently obtain maintenance medications while uninsured  Please see past updates related to this goal by clicking on the "Past Updates" button in the  selected goal    I       The patient verbalized understanding of instructions provided today and declined a print copy of patient instruction materials.   The care management team will reach out to the patient again over the next 7 days.   Cranford Mon RN, CCM, [facility] Clinic RN Care Manager [phone]

## 2019-10-05 NOTE — Telephone Encounter (Signed)
Pt called to report MD would have to fill out paperwork on [employer] website in order for her to get assistance with ajovy. Pt states her PCP is unable to fill out paperwork due to not being prescribing provider.

## 2019-10-05 NOTE — Chronic Care Management (AMB) (Signed)
Care Management   Follow Up Note   10/05/2019 Name: Stacey Price MRN: [id number] DOB: Mar 13, 1976  Referred by: [name], MD Reason for referral : Care Coordination (HTN, asthma, migraines)   Stacey Price is a 44 y.o. year old female who is a primary care patient of [name], MD. The care management team was consulted for assistance with care management and care coordination needs.    Review of patient status, including review of consultants reports, relevant laboratory and other test results, and collaboration with appropriate care team members and the patient's provider was performed as part of comprehensive patient evaluation and provision of chronic care management services.    SDOH (Social Determinants of Health) assessments performed: No See Care Plan activities for detailed interventions related to [facility])     Advanced Directives: See Care Plan and Vynca application for related entries.   Goals Addressed            This Visit's Progress     Patient Stated   . "I told my doctor and the social worker I cannot afford my medications." (pt-stated)       CARE PLAN ENTRY (see longitudinal plan of care for additional care plan information)   Current Barriers:  . Chronic Disease Management support, education, and care coordination needs related to HTN and Asthma and migraines- patient states she has had several deaths in her family and her best friends parents are in a health crisis so she has been experiencing increased migraines, she says she called her neurologist's office (Dr Krista Blue) to ask her to complete the [facility] healthcare professional form  so that she can apply for financial assistance for Ajovy . The neurologist office told her she will need to be seen but patient cannot afford to see a specialist as she is currently uninsured. The neurologist suggested she contact her primary care provider and ask if she will manage her migraines and assist patient with applying  for [place] assistance until she can resume care with her neurologist.        Patient denies recent asthma exacerbation.       She says she has not yet contacted the Medication Assistance Program to see if she can receive help with her other maintenance medications.   Case Manager Clinical Goal(s):  [name] Over the next  days, patient will work with BSW to address needs related to Financial constraints related to procuring medications and affording rent  in patient with HTN and asthma  and migraines  . Nurse Case Manager Clinical Goal(s):  [name] Over the next 30 days, patient will verbalize understanding of plan for contacting appropriate patient assistance programs for ongoing medication assistance while she is uninsured . Over the next 30 days, patient will attend all scheduled medical appointments:   . Interventions:  . Evaluation of current treatment plan related to HTN, Asthma and migraines and patient's adherence to plan as established by provider. . Advised patient this CCM RN will message Dr. Daryll Drown to see if she will manage patient's migraines and complete [employer] healthcare professional form to apply for Ajovy patient assistance program,  . Reviewed request for patient to ask pharmacists at [facility] about medication assistance/coupons for her blood pressure and asthma medications, and to call the [place] (MAP) for ongoing assistance with maintenance medications while she is uninsured. Verified with patient that she received the written information and contact number for the MAP program in  the mail  . Discussed plans with patient for ongoing care management follow up and provided patient with direct contact information for care management team . Advised patient, providing education and rationale, to monitor blood pressure at local pharmacies and to call her primary care provider for findings outside established parameters.    . Ensured patient received written [facility] educational materials in mail and asked if she had any questions   . Patient Self Care Activities:  . Patient verbalizes understanding of plan to contact medication assistance programs and/or pharmacists for ongoing assistance with maintenance medications   . Self administers medications as prescribed . Attends all scheduled provider appointments . Calls pharmacy for medication refills . Calls provider office for new concerns or questions . Unable to independently obtain maintenance medications while uninsured  Please see past updates related to this goal by clicking on the "Past Updates" button in the selected goal    I        The care management team will reach out to the patient again over the next 7 days.   Cranford Mon RN, CCM, [facility] CCM Clinic RN Care Manager [phone]

## 2019-10-07 ENCOUNTER — Telehealth: Payer: Medicaid Other

## 2019-10-07 ENCOUNTER — Ambulatory Visit: Payer: Self-pay | Admitting: *Deleted

## 2019-10-07 DIAGNOSIS — J4520 Mild intermittent asthma, uncomplicated: Secondary | ICD-10-CM

## 2019-10-07 DIAGNOSIS — I10 Essential (primary) hypertension: Secondary | ICD-10-CM

## 2019-10-07 NOTE — Chronic Care Management (AMB) (Signed)
  Care Management   Note  10/07/2019 Name: Stacey Price MRN: [id number] DOB: 20-Aug-1975  Left message on patient's mobile number advising her awaiting response from Dr. Criselda Peaches regarding managing her migraines and assisting with Ajovy and that this CCM RN will call her as soon as a response is received.    The care management team will reach out to the patient again over the next 7 days.   Cranford Mon RN, CCM, [facility] CCM Clinic RN Care Manager [phone]

## 2019-10-10 ENCOUNTER — Telehealth: Payer: Medicaid Other

## 2019-10-10 ENCOUNTER — Ambulatory Visit: Payer: Medicaid Other

## 2019-10-10 DIAGNOSIS — J4520 Mild intermittent asthma, uncomplicated: Secondary | ICD-10-CM

## 2019-10-10 DIAGNOSIS — F419 Anxiety disorder, unspecified: Secondary | ICD-10-CM

## 2019-10-10 DIAGNOSIS — I10 Essential (primary) hypertension: Secondary | ICD-10-CM

## 2019-10-10 NOTE — Chronic Care Management (AMB) (Deleted)
Care Management   Follow Up Note   10/10/2019 Name: Stacey Price MRN: [id number] DOB: 12-18-1975  Referred by: Inez Catalina, MD Reason for referral : Care Coordination (Psychiatric resources )   Stacey Price is a 43 y.o. year old female who is a primary care patient of Inez Catalina, MD. The care management team was consulted for assistance with care management and care coordination needs.    Review of patient status, including review of consultants reports, relevant laboratory and other test results, and collaboration with appropriate care team members and the patient's provider was performed as part of comprehensive patient evaluation and provision of chronic care management services.    SDOH (Social Determinants of Health) assessments performed: No See Care Plan activities for detailed interventions related to [facility])     Advanced Directives: See Care Plan and Vynca application for related entries.   Goals Addressed            This Visit's Progress   . "I need to find a psychiatrist that accepts patients without insurance"       CARE PLAN ENTRY (see longitudinal plan of care for additional care plan information)  Current Barriers:  . Financial constraints related to minimal insurance coverage . Medication procurement  Clinical Social Work Clinical Goal(s):  [name] Over the next 60 days, patient will work with SW to address concerns related to locating psychiatric provider.    Interventions:  . Informed patient of collaboration with provider regarding psychiatric medications.  Patient should have medications managed by psychiatric provider.  Geradine Girt [facility] to ensure they are accepting new patients.   . Informed patient that she may contact [facility] any time before 3:00 PM, Monday through Friday to schedule an initial virtual visit.   [name] Provided patient with list of other outpatient mental health providers for patients with no insurance and/or self pay   Patient  Self Care Activities:  . Patient verbalizes understanding of plan to need to locate psychiatric provider for medication management.  . Minimal insurance coverage  Initial goal documentation     . "I sometimes struggle to pay my rent." (pt-stated)       Current Barriers:  [name] Knowledge Barriers related to resources available to address financial constraints/rental assistance  Case Manager Clinical Goal(s):  [name] Over the next 60 days, patient will work with BSW to address needs related to financial constraints  . Over the next 60 days, BSW will collaborate with RN Care Manager to address care management and care coordination needs  Interventions:  . Patient interviewed and appropriate assessments performed . Patient provided with contact information for the following agencies that may be able to provide financial assistance:  [employer], [address], [employer], [place]. [place] [employer], [employer], [employer], . . Referrals submitted to [facility] and [employer] via [employer] [place] platform . Collaborated with RN Care Manager and patient to establish an individualized plan of care  Patient Self Care Activities:  . Self administers medications as prescribed . Attends all scheduled provider appointments . Calls pharmacy for medication refills . Calls provider office for new concerns or questions  Initial goal documentation         Telephone follow up appointment with care management team member scheduled for:10/24/19      Jolee Critcher, BSW Embedded Care Coordination Social Worker [facility] [phone]

## 2019-10-10 NOTE — Progress Notes (Signed)
Internal Medicine Clinic Resident   I have personally reviewed this encounter including the documentation in this note and/or discussed this patient with the care management provider. I will address any urgent items identified by the care management provider and will communicate my actions to the patient's PCP. I have reviewed the patient's CCM visit with my supervising attending.  Rehan Holness, MD 10/10/2019   

## 2019-10-10 NOTE — Patient Instructions (Signed)
Visit Information  Goals Addressed            This Visit's Progress   . "I need to find a psychiatrist that accepts patients without insurance"       CARE PLAN ENTRY (see longitudinal plan of care for additional care plan information)  Current Barriers:  . Financial constraints related to minimal insurance coverage . Medication procurement  Clinical Social Work Clinical Goal(s):  [name] Over the next 60 days, patient will work with SW to address concerns related to locating psychiatric provider.    Interventions:  . Informed patient of collaboration with provider regarding psychiatric medications.  Patient should have medications managed by psychiatric provider.  Geradine Girt [facility] to ensure they are accepting new patients.   . Informed patient that she may contact [facility] any time before 3:00 PM, Monday through Friday to schedule an initial virtual visit.   [name] Provided patient with list of other outpatient mental health providers for patients with no insurance and/or self pay   Patient Self Care Activities:  . Patient verbalizes understanding of plan to need to locate psychiatric provider for medication management.  . Minimal insurance coverage  Initial goal documentation     . "I sometimes struggle to pay my rent." (pt-stated)       Current Barriers:  [name] Knowledge Barriers related to resources available to address financial constraints/rental assistance  Case Manager Clinical Goal(s):  [name] Over the next 60 days, patient will work with BSW to address needs related to financial constraints  . Over the next 60 days, BSW will collaborate with RN Care Manager to address care management and care coordination needs  Interventions:  . Checked status of referral to [employer].  Referral has been accepted. . Checked status of referral to [employer].  Referral still "needs action"  Patient Self Care Activities:  . Self administers medications as  prescribed . Attends all scheduled provider appointments . Calls pharmacy for medication refills . Calls provider office for new concerns or questions  Please see past updates related to this goal by clicking on the "Past Updates" button in the selected goal         Patient verbalizes understanding of instructions provided today.   Telephone follow up appointment with care management team member scheduled for:10/24/19      Malachy Chamber, BSW Embedded Care Coordination Social Worker [facility] [phone]

## 2019-10-10 NOTE — Chronic Care Management (AMB) (Signed)
  Care Management   Follow Up Note   10/10/2019 Name: Stacey Price MRN: [id number] DOB: 14-Nov-1975  Referred by: Inez Catalina, MD Reason for referral : Care Coordination (Psychiatric resources )   Stacey Price is a 44 y.o. year old female who is a primary care patient of Inez Catalina, MD. The care management team was consulted for assistance with care management and care coordination needs.    Review of patient status, including review of consultants reports, relevant laboratory and other test results, and collaboration with appropriate care team members and the patient's provider was performed as part of comprehensive patient evaluation and provision of chronic care management services.    SDOH (Social Determinants of Health) assessments performed: No See Care Plan activities for detailed interventions related to [facility])     Advanced Directives: See Care Plan and Vynca application for related entries.   Goals Addressed            This Visit's Progress   . "I need to find a psychiatrist that accepts patients without insurance"       CARE PLAN ENTRY (see longitudinal plan of care for additional care plan information)  Current Barriers:  . Financial constraints related to minimal insurance coverage . Medication procurement  Clinical Social Work Clinical Goal(s):  [name] Over the next 60 days, patient will work with SW to address concerns related to locating psychiatric provider.    Interventions:  . Informed patient of collaboration with provider regarding psychiatric medications.  Patient should have medications managed by psychiatric provider.  Stacey Price [facility] to ensure they are accepting new patients.   . Informed patient that she may contact [facility] any time before 3:00 PM, Monday through Friday to schedule an initial virtual visit.   [name] Provided patient with list of other outpatient mental health providers for patients with no insurance and/or self pay   Patient  Self Care Activities:  . Patient verbalizes understanding of plan to need to locate psychiatric provider for medication management.  . Minimal insurance coverage  Initial goal documentation     . "I sometimes struggle to pay my rent." (pt-stated)       Current Barriers:  [name] Knowledge Barriers related to resources available to address financial constraints/rental assistance  Case Manager Clinical Goal(s):  [name] Over the next 60 days, patient will work with BSW to address needs related to financial constraints  . Over the next 60 days, BSW will collaborate with RN Care Manager to address care management and care coordination needs  Interventions:  . Checked status of referral to [employer].  Referral has been accepted. . Checked status of referral to [employer].  Referral still "needs action"  Patient Self Care Activities:  . Self administers medications as prescribed . Attends all scheduled provider appointments . Calls pharmacy for medication refills . Calls provider office for new concerns or questions  Please see past updates related to this goal by clicking on the "Past Updates" button in the selected goal          Telephone follow up appointment with care management team member scheduled for:10/24/19   Stacey Price, BSW Embedded Care Coordination Social Worker [facility] [phone]

## 2019-10-11 ENCOUNTER — Ambulatory Visit: Payer: Self-pay | Admitting: *Deleted

## 2019-10-11 DIAGNOSIS — I10 Essential (primary) hypertension: Secondary | ICD-10-CM

## 2019-10-11 DIAGNOSIS — J4520 Mild intermittent asthma, uncomplicated: Secondary | ICD-10-CM

## 2019-10-11 DIAGNOSIS — G43709 Chronic migraine without aura, not intractable, without status migrainosus: Secondary | ICD-10-CM

## 2019-10-11 DIAGNOSIS — IMO0002 Reserved for concepts with insufficient information to code with codable children: Secondary | ICD-10-CM

## 2019-10-11 NOTE — Progress Notes (Signed)
Internal Medicine Clinic Attending  CCM services provided by the care management provider and their documentation were discussed with Dr. Winters. We reviewed the pertinent findings, urgent action items addressed by the resident and non-urgent items to be addressed by the PCP.  I agree with the assessment, diagnosis, and plan of care documented in the CCM and resident's note.  Jakai Risse, MD 10/11/2019  

## 2019-10-11 NOTE — Chronic Care Management (AMB) (Signed)
Chronic Care Management   Follow Up Note   10/11/2019 Name: Stacey Price MRN: [id number] DOB: 07-08-1975  Referred by: Inez Catalina, MD Reason for referral : Care Coordination (HTN, Asthma ,migraines)   Stacey Price is a 44 y.o. year old female who is a primary care patient of Inez Catalina, MD. The CCM team was consulted for assistance with chronic disease management and care coordination needs.    Review of patient status, including review of consultants reports, relevant laboratory and other test results, and collaboration with appropriate care team members and the patient's provider was performed as part of comprehensive patient evaluation and provision of chronic care management services.    SDOH (Social Determinants of Health) assessments performed: No See Care Plan activities for detailed interventions related to [facility])     Outpatient Encounter Medications as of 10/11/2019  Medication Sig Note  . AJOVY 225 MG/1.5ML SOSY INJECT 225 MG INTO THE SKIN EVERY 30 DAYS. (Patient not taking: Reported on 10/05/2019) 10/05/2019: Has not been taking for last 4 months as she has no health insurance and cannot afford the out of pocket cost  . albuterol (VENTOLIN HFA) 108 (90 Base) MCG/ACT inhaler Inhale 1-2 puffs into the lungs every 6 (six) hours as needed for wheezing or shortness of breath.   . busPIRone (BUSPAR) 5 MG tablet Take 2 tablets (10 mg total) by mouth 3 (three) times daily.   [name] dicyclomine (BENTYL) 20 MG tablet Take 1 tablet (20 mg total) by mouth 3 times/day as needed-between meals & bedtime (Abdominal pain).   . DULoxetine (CYMBALTA) 60 MG capsule Take 1 capsule (60 mg total) by mouth daily.   . Fluticasone-Salmeterol (ADVAIR) 250-50 MCG/DOSE AEPB Inhale 1 puff into the lungs 2 (two) times daily. IM Program   . metoprolol succinate (TOPROL-XL) 25 MG 24 hr tablet Take 0.5 tablets (12.5 mg total) by mouth daily.   [name] topiramate (TOPAMAX) 50 MG tablet Take 2 tablets (100 mg total)  by mouth 2 (two) times daily.    No facility-administered encounter medications on file as of 10/11/2019.     Objective:   Goals Addressed            This Visit's Progress     Patient Stated   . "I can't afford my migraine medication" (pt-stated)       Current Barriers:  [name] Knowledge Barriers related to resources and support available to address Medication procurement  Case Manager Clinical Goal(s):  [name] Over the next 30 days, patient will work with BSW to address needs related to Medication procurement . Over the next 30 days, BSW will collaborate with RN Care Manager to address care management and care coordination needs  Interventions:  . Left message for patient advising her Dr Criselda Peaches will assist with Ajovy and that the Healthcare Professional form was placed in Dr. Donnetta Hutching box today for her completion. Once signed this CCM RN will assist patient with her portion of the application form and then fax to [facility] [employer] Patient Assistance Program for Ajovy assistance .  [name] Patient Self Care Activities:  . Self administers medications as prescribed . Attends all scheduled provider appointments . Calls pharmacy for medication refills  Please see past updates related to this goal by clicking on the "Past Updates" button in the selected goal          Plan:   The care management team will reach out to the patient again over the next 7 days.    Marylu Lund  Derinda Sis, CCM, [facility] RN Care Manager [phone]

## 2019-10-11 NOTE — Progress Notes (Signed)
Internal Medicine Clinic Resident   I have personally reviewed this encounter including the documentation in this note and/or discussed this patient with the care management provider. I will address any urgent items identified by the care management provider and will communicate my actions to the patient's PCP. I have reviewed the patient's CCM visit with my supervising attending.  Savhanna Sliva, MD 10/11/2019   

## 2019-10-11 NOTE — Progress Notes (Signed)
Internal Medicine Clinic Attending  CCM services provided by the care management provider and their documentation were discussed with Dr. Winters. We reviewed the pertinent findings, urgent action items addressed by the resident and non-urgent items to be addressed by the PCP.  I agree with the assessment, diagnosis, and plan of care documented in the CCM and resident's note.  Danice Dippolito, MD 10/11/2019  

## 2019-10-12 ENCOUNTER — Telehealth: Payer: Medicaid Other

## 2019-10-13 ENCOUNTER — Ambulatory Visit: Payer: Medicaid Other | Admitting: *Deleted

## 2019-10-13 DIAGNOSIS — J4520 Mild intermittent asthma, uncomplicated: Secondary | ICD-10-CM

## 2019-10-13 DIAGNOSIS — I10 Essential (primary) hypertension: Secondary | ICD-10-CM

## 2019-10-13 NOTE — Patient Instructions (Signed)
Visit Information Please stop by the clinic today and fill our your portion of the Ajovy patient assistance program form and we will fax it to the company.  Goals Addressed            This Visit's Progress     Patient Stated   . "I can't afford my migraine medication" (pt-stated)       Current Barriers:  [name] Knowledge Barriers related to resources and support available to address Medication procurement  Case Manager Clinical Goal(s):  [name] Over the next 30 days, patient will work with BSW to address needs related to Medication procurement . Over the next 30 days, BSW will collaborate with RN Care Manager to address care management and care coordination needs  Interventions:  . Called patient and requested she stop by clinic and complete patient portion of Ajovy patient assistance program (PAP) form . Have asked BSW to fax the completed to the Ajovy PAP form once patient completes her portion .  [name] Patient Self Care Activities:  . Self administers medications as prescribed . Attends all scheduled provider appointments . Calls pharmacy for medication refills  Please see past updates related to this goal by clicking on the "Past Updates" button in the selected goal      . "I told my doctor and the social worker I cannot afford my medications." (pt-stated)       CARE PLAN ENTRY (see longitudinal plan of care for additional care plan information)   Current Barriers:  . Chronic Disease Management support, education, and care coordination needs related to HTN and Asthma and migraines- returned call to patient after she returned call to this [facility] and that she will need to come by the clinic and complete the patient form. Stacey Price says she will stop by the clinic this afternoon to complete the form. Once she has completed it , the form will be faxed to the Ajovy patient assistance program.   Case Manager Clinical Goal(s):  [name] Over the next  days, patient will work with BSW to address needs  related to Financial constraints related to procuring medications and affording rent  in patient with HTN and asthma  and migraines . Patient will work with CCM RN and provider with competing Ajovy patient assistance program forms  . Nurse Case Manager Clinical Goal(s):  [name] Over the next 30 days, patient will verbalize understanding of plan for contacting appropriate patient assistance programs for ongoing medication assistance while she is uninsured . Over the next 30 days, patient will attend all scheduled medical appointments:   . Interventions:  . Called patient and asked she completed her portion of the Ajovy patient assistance form   . Patient Self Care Activities:  . Patient verbalizes understanding of plan to contact medication assistance programs and/or pharmacists for ongoing assistance with maintenance medications   . Self administers medications as prescribed . Attends all scheduled provider appointments . Calls pharmacy for medication refills . Calls provider office for new concerns or questions . Unable to independently obtain maintenance medications while uninsured  Please see past updates related to this goal by clicking on the "Past Updates" button in the selected goal    I       The patient verbalized understanding of instructions provided today and declined a print copy of patient instruction materials.   The care management team will reach out to the patient again over the next 21 days.   Stacey Mon RN, CCM, CDC[facility] CCM Clinic RN  Care Manager [phone]

## 2019-10-13 NOTE — Progress Notes (Signed)
Internal Medicine Clinic Resident   I have personally reviewed this encounter including the documentation in this note and/or discussed this patient with the care management provider. I will address any urgent items identified by the care management provider and will communicate my actions to the patient's PCP. I have reviewed the patient's CCM visit with my supervising attending.  Mertha Clyatt, MD 10/13/2019   

## 2019-10-13 NOTE — Chronic Care Management (AMB) (Signed)
Care Management   Follow Up Note   10/13/2019 Name: Stacey Price MRN: [id number] DOB: [birth year]  Referred by: Inez Catalina, MD Reason for referral : Care Coordination (HTN, Migraines, asthma)   Stacey Price is a 44 y.o. year old female who is a primary care patient of Inez Catalina, MD. The care management team was consulted for assistance with care management and care coordination needs.    Review of patient status, including review of consultants reports, relevant laboratory and other test results, and collaboration with appropriate care team members and the patient's provider was performed as part of comprehensive patient evaluation and provision of chronic care management services.    SDOH (Social Determinants of Health) assessments performed: No See Care Plan activities for detailed interventions related to [facility])     Advanced Directives: See Care Plan and Vynca application for related entries.   Goals Addressed            This Visit's Progress     Patient Stated   . "I can't afford my migraine medication" (pt-stated)       Current Barriers:  [name] Knowledge Barriers related to resources and support available to address Medication procurement  Case Manager Clinical Goal(s):  [name] Over the next 30 days, patient will work with BSW to address needs related to Medication procurement . Over the next 30 days, BSW will collaborate with RN Care Manager to address care management and care coordination needs  Interventions:  . Called patient and requested she stop by clinic and complete patient portion of [employer] patient assistance program (PAP) form . Have asked BSW to fax the completed to the [employer] PAP form once patient completes her portion .  [name] Patient Self Care Activities:  . Self administers medications as prescribed . Attends all scheduled provider appointments . Calls pharmacy for medication refills  Please see past updates related to this goal by clicking on the "Past  Updates" button in the selected goal      . "I told my doctor and the social worker I cannot afford my medications." (pt-stated)       CARE PLAN ENTRY (see longitudinal plan of care for additional care plan information)   Current Barriers:  . Chronic Disease Management support, education, and care coordination needs related to HTN and Asthma and migraines- returned call to patient after she returned call to this [facility] and that she will need to come by the clinic and complete the patient form. Ms. [name] says she will stop by the clinic this afternoon to complete the form. Once she has completed it , the form will be faxed to the [employer] patient assistance program.   Case Manager Clinical Goal(s):  [name] Over the next  days, patient will work with BSW to address needs related to Financial constraints related to procuring medications and affording rent  in patient with HTN and asthma  and migraines . Patient will work with CCM RN and provider with competing [employer] patient assistance program forms  . Nurse Case Manager Clinical Goal(s):  [name] Over the next 30 days, patient will verbalize understanding of plan for contacting appropriate patient assistance programs for ongoing medication assistance while she is uninsured . Over the next 30 days, patient will attend all scheduled medical appointments:   . Interventions:  . Called patient and asked she completed her portion of the [employer] patient assistance form   . Patient Self Care Activities:  . Patient verbalizes understanding of plan to contact medication  assistance programs and/or pharmacists for ongoing assistance with maintenance medications   . Self administers medications as prescribed . Attends all scheduled provider appointments . Calls pharmacy for medication refills . Calls provider office for new concerns or questions . Unable to independently obtain maintenance medications while uninsured  Please see past updates related to this goal by  clicking on the "Past Updates" button in the selected goal    I        The care management team will reach out to the patient again over the next 21 days.   [name] RN, CCM, [facility] RN Care Manager [phone]

## 2019-10-18 ENCOUNTER — Ambulatory Visit: Payer: Medicaid Other | Admitting: *Deleted

## 2019-10-18 DIAGNOSIS — J4520 Mild intermittent asthma, uncomplicated: Secondary | ICD-10-CM

## 2019-10-18 DIAGNOSIS — G43709 Chronic migraine without aura, not intractable, without status migrainosus: Secondary | ICD-10-CM

## 2019-10-18 DIAGNOSIS — IMO0002 Reserved for concepts with insufficient information to code with codable children: Secondary | ICD-10-CM

## 2019-10-18 DIAGNOSIS — I10 Essential (primary) hypertension: Secondary | ICD-10-CM

## 2019-10-18 NOTE — Patient Instructions (Signed)
Visit Information Completed forms were faxed to [employer] patient assistance program today. I will contact you when I hear from the company if you have been approved. Goals Addressed            This Visit's Progress     Patient Stated   . "I told my doctor and the social worker I cannot afford my medications." (pt-stated)       CARE PLAN ENTRY (see longitudinal plan of care for additional care plan information)   Current Barriers:  . Chronic Disease Management support, education, and care coordination needs related to HTN and Asthma and migraines- 10/17/19 patient stated by clinic and completed form for [employer] patient assistance.   Case Manager Clinical Goal(s):  [name] Over the next  days, patient will work with BSW to address needs related to Financial constraints related to procuring medications and affording rent  in patient with HTN and asthma  and migraines . Patient will work with CCM RN and provider with competing [employer] patient assistance program forms  . Nurse Case Manager Clinical Goal(s):  [name] Over the next 30 days, patient will verbalize understanding of plan for contacting appropriate patient assistance programs for ongoing medication assistance while she is uninsured . Over the next 30 days, patient will attend all scheduled medical appointments:   . Interventions:  . Faxed completed form to [facility] Patient Assistance Program at fax # [phone]   . Patient Self Care Activities:  . Patient verbalizes understanding of plan to contact medication assistance programs and/or pharmacists for ongoing assistance with maintenance medications   . Self administers medications as prescribed . Attends all scheduled provider appointments . Calls pharmacy for medication refills . Calls provider office for new concerns or questions . Unable to independently obtain maintenance medications while uninsured  Please see past updates related to this goal by clicking on the "Past Updates" button in  the selected goal    I       The patient verbalized understanding of instructions provided today and declined a print copy of patient instruction materials.   The care management team will reach out to the patient again over the next 7-14 days.   Cranford Mon RN, CCM, [facility] CCM Clinic RN Care Manager [phone]

## 2019-10-18 NOTE — Progress Notes (Signed)
Internal Medicine Clinic Resident   I have personally reviewed this encounter including the documentation in this note and/or discussed this patient with the care management provider. I will address any urgent items identified by the care management provider and will communicate my actions to the patient's PCP. I have reviewed the patient's CCM visit with my supervising attending.  Brandon Wyndi Northrup, MD 10/18/2019    

## 2019-10-18 NOTE — Chronic Care Management (AMB) (Signed)
Chronic Care Management   Follow Up Note   10/18/2019 Name: Stacey Price MRN: [id number] DOB: 11-23-1975  Referred by: Inez Catalina, MD Reason for referral : Care Coordination (HTN, migraines)   Stacey Price is a 44 y.o. year old female who is a primary care patient of Inez Catalina, MD. The CCM team was consulted for assistance with chronic disease management and care coordination needs.    Review of patient status, including review of consultants reports, relevant laboratory and other test results, and collaboration with appropriate care team members and the patient's provider was performed as part of comprehensive patient evaluation and provision of chronic care management services.    SDOH (Social Determinants of Health) assessments performed: No See Care Plan activities for detailed interventions related to [facility])     Outpatient Encounter Medications as of 10/18/2019  Medication Sig Note  . [employer] 225 MG/1.5ML SOSY INJECT 225 MG INTO THE SKIN EVERY 30 DAYS. (Patient not taking: Reported on 10/05/2019) 10/05/2019: Has not been taking for last 4 months as she has no health insurance and cannot afford the out of pocket cost  . albuterol (VENTOLIN HFA) 108 (90 Base) MCG/ACT inhaler Inhale 1-2 puffs into the lungs every 6 (six) hours as needed for wheezing or shortness of breath.   . busPIRone (BUSPAR) 5 MG tablet Take 2 tablets (10 mg total) by mouth 3 (three) times daily.   [name] dicyclomine (BENTYL) 20 MG tablet Take 1 tablet (20 mg total) by mouth 3 times/day as needed-between meals & bedtime (Abdominal pain).   . DULoxetine (CYMBALTA) 60 MG capsule Take 1 capsule (60 mg total) by mouth daily.   . Fluticasone-Salmeterol (ADVAIR) 250-50 MCG/DOSE AEPB Inhale 1 puff into the lungs 2 (two) times daily. IM Program   . metoprolol succinate (TOPROL-XL) 25 MG 24 hr tablet Take 0.5 tablets (12.5 mg total) by mouth daily.   [name] topiramate (TOPAMAX) 50 MG tablet Take 2 tablets (100 mg total) by  mouth 2 (two) times daily.    No facility-administered encounter medications on file as of 10/18/2019.     Objective:   Goals Addressed            This Visit's Progress     Patient Stated   . "I told my doctor and the social worker I cannot afford my medications." (pt-stated)       CARE PLAN ENTRY (see longitudinal plan of care for additional care plan information)   Current Barriers:  . Chronic Disease Management support, education, and care coordination needs related to HTN and Asthma and migraines- 10/17/19 patient stated by clinic and completed form for [employer] patient assistance.   Case Manager Clinical Goal(s):  [name] Over the next  days, patient will work with BSW to address needs related to Financial constraints related to procuring medications and affording rent  in patient with HTN and asthma  and migraines . Patient will work with CCM RN and provider with competing [employer] patient assistance program forms  . Nurse Case Manager Clinical Goal(s):  [name] Over the next 30 days, patient will verbalize understanding of plan for contacting appropriate patient assistance programs for ongoing medication assistance while she is uninsured . Over the next 30 days, patient will attend all scheduled medical appointments:   . Interventions:  . Faxed completed form to [employer] Patient Assistance Program (PAP) at fax # [phone] . Called patient and left message advising completed form has been faxed and that patietn will be conacted when [employer] PAP  determines patient's eligibility   . Patient Self Care Activities:  . Patient verbalizes understanding of plan to contact medication assistance programs and/or pharmacists for ongoing assistance with maintenance medications   . Self administers medications as prescribed . Attends all scheduled provider appointments . Calls pharmacy for medication refills . Calls provider office for new concerns or questions . Unable to independently obtain  maintenance medications while uninsured  Please see past updates related to this goal by clicking on the "Past Updates" button in the selected goal            Plan:   The care management team will reach out to the patient again over the next 7-14 days days.    [name] RN, CCM, [facility] RN Care Manager [phone]

## 2019-10-19 NOTE — Progress Notes (Signed)
Internal Medicine Clinic Attending  CCM services provided by the care management provider and their documentation were discussed with Dr. Winfrey. We reviewed the pertinent findings, urgent action items addressed by the resident and non-urgent items to be addressed by the PCP.  I agree with the assessment, diagnosis, and plan of care documented in the CCM and resident's note.  Jamelyn Bovard, MD 10/19/2019 

## 2019-10-19 NOTE — Progress Notes (Signed)
Internal Medicine Clinic Attending  CCM services provided by the care management provider and their documentation were discussed with Dr. Winters. We reviewed the pertinent findings, urgent action items addressed by the resident and non-urgent items to be addressed by the PCP.  I agree with the assessment, diagnosis, and plan of care documented in the CCM and resident's note.  Verona Hartshorn, MD 10/19/2019  

## 2019-10-24 ENCOUNTER — Ambulatory Visit: Payer: Self-pay | Admitting: *Deleted

## 2019-10-24 ENCOUNTER — Ambulatory Visit: Payer: Medicaid Other

## 2019-10-24 DIAGNOSIS — G43709 Chronic migraine without aura, not intractable, without status migrainosus: Secondary | ICD-10-CM

## 2019-10-24 DIAGNOSIS — J4520 Mild intermittent asthma, uncomplicated: Secondary | ICD-10-CM

## 2019-10-24 DIAGNOSIS — IMO0002 Reserved for concepts with insufficient information to code with codable children: Secondary | ICD-10-CM

## 2019-10-24 DIAGNOSIS — I10 Essential (primary) hypertension: Secondary | ICD-10-CM

## 2019-10-24 NOTE — Patient Instructions (Signed)
Visit Information  Goals Addressed            This Visit's Progress   . "I can't afford my migraine medication" (pt-stated)       Current Barriers:  [name] Knowledge Barriers related to resources and support available to address Medication procurement  Case Manager Clinical Goal(s):  [name] Over the next 30 days, patient will work with BSW to address needs related to Medication procurement . Over the next 30 days, BSW will collaborate with RN Care Manager to address care management and care coordination needs  Interventions:  [name] Messaged Dr. Criselda Peaches regarding patient's request for letter to excuse her from work for 15 days due to [facility] application still pending.    . Patient Self Care Activities:  . Self administers medications as prescribed . Attends all scheduled provider appointments . Calls pharmacy for medication refills  Please see past updates related to this goal by clicking on the "Past Updates" button in the selected goal      . COMPLETED: "I need to find a psychiatrist that accepts patients without insurance"       CARE PLAN ENTRY (see longitudinal plan of care for additional care plan information)  Current Barriers:  . Financial constraints related to minimal insurance coverage . Medication procurement  Clinical Social Work Clinical Goal(s):  [name] Over the next 60 days, patient will work with SW to address concerns related to locating psychiatric provider.    Interventions:  . Followed up with patient regarding resources provided for psychiatric services.  Patient has appointment at [facility] on 12/12/19   Patient Self Care Activities:  . Patient verbalizes understanding of plan to need to locate psychiatric provider for medication management.  . Minimal insurance coverage  Initial goal documentation     . COMPLETED: "I sometimes struggle to pay my rent." (pt-stated)       Current Barriers:  [name] Knowledge Barriers related to resources available to address financial  constraints/rental assistance  Case Manager Clinical Goal(s):  [name] Over the next 60 days, patient will work with BSW to address needs related to financial constraints  . Over the next 60 days, BSW will collaborate with RN Care Manager to address care management and care coordination needs  Interventions:  . Sent list of community resources to patient via email.   . Reminded patient about [place] [address] as option for available resources in the area    . Patient Self Care Activities:  . Self administers medications as prescribed . Attends all scheduled provider appointments . Calls pharmacy for medication refills . Calls provider office for new concerns or questions  Please see past updates related to this goal by clicking on the "Past Updates" button in the selected goal         Patient verbalizes understanding of instructions provided today.   The patient has been provided with contact information for the care management team and has been advised to call with any health related questions or concerns.      Stacey Price, BSW Embedded Care Coordination Social Worker [facility] [phone]

## 2019-10-24 NOTE — Chronic Care Management (AMB) (Signed)
Care Management   Follow Up Note   10/24/2019 Name: Stacey Price MRN: [id number] DOB: 10-Jul-1975  Referred by: [name], MD Reason for referral : Care Coordination [facility] Resources)   Stacey Price is a 44 y.o. year old female who is a primary care patient of [name], MD. The care management team was consulted for assistance with care management and care coordination needs.    Review of patient status, including review of consultants reports, relevant laboratory and other test results, and collaboration with appropriate care team members and the patient's provider was performed as part of comprehensive patient evaluation and provision of chronic care management services.    SDOH (Social Determinants of Health) assessments performed: No See Care Plan activities for detailed interventions related to [facility])     Advanced Directives: See Care Plan and Vynca application for related entries.   Goals Addressed            This Visit's Progress   . "I can't afford my migraine medication" (pt-stated)       Current Barriers:  [name] Knowledge Barriers related to resources and support available to address Medication procurement  Case Manager Clinical Goal(s):  [name] Over the next 30 days, patient will work with BSW to address needs related to Medication procurement . Over the next 30 days, BSW will collaborate with RN Care Manager to address care management and care coordination needs  Interventions:  [name] Messaged Dr. Daryll Drown regarding patient's request for letter to excuse her from work for 15 days due to [facility] application still pending.    . Patient Self Care Activities:  . Self administers medications as prescribed . Attends all scheduled provider appointments . Calls pharmacy for medication refills  Please see past updates related to this goal by clicking on the "Past Updates" button in the selected goal      . COMPLETED: "I need to find a psychiatrist that accepts patients  without insurance"       CARE PLAN ENTRY (see longitudinal plan of care for additional care plan information)  Current Barriers:  . Financial constraints related to minimal insurance coverage . Medication procurement  Clinical Social Work Clinical Goal(s):  [name] Over the next 60 days, patient will work with SW to address concerns related to locating psychiatric provider.    Interventions:  . Followed up with patient regarding resources provided for psychiatric services.  Patient has appointment at [facility] on 12/12/19   Patient Self Care Activities:  . Patient verbalizes understanding of plan to need to locate psychiatric provider for medication management.  . Minimal insurance coverage  Initial goal documentation     . COMPLETED: "I sometimes struggle to pay my rent." (pt-stated)       Current Barriers:  [name] Knowledge Barriers related to resources available to address financial constraints/rental assistance  Case Manager Clinical Goal(s):  [name] Over the next 60 days, patient will work with BSW to address needs related to financial constraints  . Over the next 60 days, BSW will collaborate with RN Care Manager to address care management and care coordination needs  Interventions:  . Sent list of community resources to patient via email.   . Reminded patient about  [address] as option for available resources in the area    . Patient Self Care Activities:  . Self administers medications as prescribed . Attends all scheduled provider appointments . Calls pharmacy for medication refills . Calls provider office for new concerns or questions  Please see  past updates related to this goal by clicking on the "Past Updates" button in the selected goal           The patient has been provided with contact information for the care management team and has been advised to call with any health related questions or concerns.    Malachy Chamber, BSW Embedded Care Coordination Social Worker [facility] [phone]

## 2019-10-24 NOTE — Chronic Care Management (AMB) (Signed)
  Care Management   Note  10/24/2019 Name: Stacey Price MRN: [id number] DOB: 08-11-1975   Patient left message for this CCM RN stating she would like a 15 day extension from work due to her migraine's . BSW Amber Chrismon called patient for routine follow up and addressed patient's request for work leave extension with patient's primary care provider. Please see Ms. Chrismon's note of today for details.  Telephone follow up appointment with care management team member scheduled for:11/03/19  Cranford Mon RN, CCM, [facility] CCM Clinic RN Care Manager [phone]

## 2019-10-24 NOTE — Progress Notes (Signed)
Internal Medicine Clinic Attending  CCM services provided by the care management provider and their documentation were discussed with Dr. Chundi. We reviewed the pertinent findings, urgent action items addressed by the resident and non-urgent items to be addressed by the PCP.  I agree with the assessment, diagnosis, and plan of care documented in the CCM and resident's note.  Kaymon Denomme C Erma Joubert, DO 10/24/2019  

## 2019-10-24 NOTE — Progress Notes (Signed)
Internal Medicine Clinic Resident   I have personally reviewed this encounter including the documentation in this note and/or discussed this patient with the care management provider. I will address any urgent items identified by the care management provider and will communicate my actions to the patient's PCP. I have reviewed the patient's CCM visit with my supervising attending.  Latausha Flamm, MD 10/24/2019    

## 2019-10-26 ENCOUNTER — Telehealth: Payer: Self-pay

## 2019-10-26 ENCOUNTER — Encounter: Payer: Self-pay | Admitting: Internal Medicine

## 2019-10-26 NOTE — Telephone Encounter (Signed)
Called to inform patient form is completed and ready for pick-up.

## 2019-10-26 NOTE — Progress Notes (Signed)
Letter for days off both printed and sent electronically.   Debe Coder, MD

## 2019-10-27 ENCOUNTER — Ambulatory Visit: Payer: Self-pay

## 2019-10-27 DIAGNOSIS — G43709 Chronic migraine without aura, not intractable, without status migrainosus: Secondary | ICD-10-CM

## 2019-10-27 DIAGNOSIS — I10 Essential (primary) hypertension: Secondary | ICD-10-CM

## 2019-10-27 DIAGNOSIS — IMO0002 Reserved for concepts with insufficient information to code with codable children: Secondary | ICD-10-CM

## 2019-10-27 NOTE — Progress Notes (Signed)
Internal Medicine Clinic Resident   I have personally reviewed this encounter including the documentation in this note and/or discussed this patient with the care management provider. I will address any urgent items identified by the care management provider and will communicate my actions to the patient's PCP. I have reviewed the patient's CCM visit with my supervising attending.  Candie Gintz, MD 10/27/2019    

## 2019-10-27 NOTE — Chronic Care Management (AMB) (Signed)
  Care Management   Follow Up Note   10/27/2019 Name: Stacey Price MRN: [id number] DOB: [birth year]  Referred by: Inez Catalina, MD Reason for referral : Care Coordination (Documentation request)   Stacey Price is a 44 y.o. year old female who is a primary care patient of Inez Catalina, MD. The care management team was consulted for assistance with care management and care coordination needs.    Review of patient status, including review of consultants reports, relevant laboratory and other test results, and collaboration with appropriate care team members and the patient's provider was performed as part of comprehensive patient evaluation and provision of chronic care management services.    SDOH (Social Determinants of Health) assessments performed: No See Care Plan activities for detailed interventions related to [facility])     Advanced Directives: See Care Plan and Vynca application for related entries.   Goals Addressed            This Visit's Progress   . "I can't afford my migraine medication" (pt-stated)       Current Barriers:  [name] Knowledge Barriers related to resources and support available to address Medication procurement  Case Manager Clinical Goal(s):  [name] Over the next 30 days, patient will work with BSW to address needs related to Medication procurement . Over the next 30 days, BSW will collaborate with RN Care Manager to address care management and care coordination needs  Interventions:  . Received communication from Dr. Criselda Peaches that letter excusing patient from work has been completed.  Patient requested this due to Ajovy application still being processed.   . Informed patient that letter can be accessed via My Chart  . Patient Self Care Activities:  . Self administers medications as prescribed . Attends all scheduled provider appointments . Calls pharmacy for medication refills  Please see past updates related to this goal by clicking on the "Past Updates"  button in the selected goal          The patient has been provided with contact information for the care management team and has been advised to call with any health related questions or concerns.      Malachy Chamber, BSW Embedded Care Coordination Social Worker [facility] [phone]

## 2019-10-27 NOTE — Patient Instructions (Signed)
Visit Information  Goals Addressed            This Visit's Progress   . "I can't afford my migraine medication" (pt-stated)       Current Barriers:  [name] Knowledge Barriers related to resources and support available to address Medication procurement  Case Manager Clinical Goal(s):  [name] Over the next 30 days, patient will work with BSW to address needs related to Medication procurement . Over the next 30 days, BSW will collaborate with RN Care Manager to address care management and care coordination needs  Interventions:  . Received communication from Dr. Criselda Peaches that letter excusing patient from work has been completed.  Patient requested this due to Ajovy application still being processed.   . Informed patient that letter can be accessed via My Chart  . Patient Self Care Activities:  . Self administers medications as prescribed . Attends all scheduled provider appointments . Calls pharmacy for medication refills  Please see past updates related to this goal by clicking on the "Past Updates" button in the selected goal         Patient verbalizes understanding of instructions provided today.   The patient has been provided with contact information for the care management team and has been advised to call with any health related questions or concerns.      Stacey Price, BSW Embedded Care Coordination Social Worker [facility] [phone]

## 2019-10-28 NOTE — Progress Notes (Signed)
Internal Medicine Clinic Attending  CCM services provided by the care management provider and their documentation were discussed with Dr. Chundi. We reviewed the pertinent findings, urgent action items addressed by the resident and non-urgent items to be addressed by the PCP.  I agree with the assessment, diagnosis, and plan of care documented in the CCM and resident's note.  Rett Stehlik, MD 10/28/2019  

## 2019-11-03 ENCOUNTER — Ambulatory Visit: Payer: Medicaid Other | Admitting: *Deleted

## 2019-11-03 DIAGNOSIS — IMO0002 Reserved for concepts with insufficient information to code with codable children: Secondary | ICD-10-CM

## 2019-11-03 DIAGNOSIS — I10 Essential (primary) hypertension: Secondary | ICD-10-CM

## 2019-11-03 DIAGNOSIS — J4520 Mild intermittent asthma, uncomplicated: Secondary | ICD-10-CM

## 2019-11-03 NOTE — Chronic Care Management (AMB) (Signed)
  Care Management   Follow Up Note   11/03/2019 Name: Stacey Price MRN: [id number] DOB: July 30, 1975  Referred by: Inez Catalina, MD Reason for referral : Care Coordination (HTN, asthma, migraines)   Stacey Price is a 44 y.o. year old female who is a primary care patient of Inez Catalina, MD. The care management team was consulted for assistance with care management and care coordination needs.    Review of patient status, including review of consultants reports, relevant laboratory and other test results, and collaboration with appropriate care team members and the patient's provider was performed as part of comprehensive patient evaluation and provision of chronic care management services.    SDOH (Social Determinants of Health) assessments performed: No See Care Plan activities for detailed interventions related to [facility])     Advanced Directives: See Care Plan and Vynca application for related entries.   Goals Addressed            This Visit's Progress     Patient Stated   . "I told my doctor and the social worker I cannot afford my medications." (pt-stated)       CARE PLAN ENTRY (see longitudinal plan of care for additional care plan information)   Current Barriers:  . Chronic Disease Management support, education, and care coordination needs related to HTN and Asthma and migraines- patient states she was approved for Ajovy until the end of 2021 and that she received her first dose this month, says she misplaced the number for the [place] office of the Medication Assistance Program (MAP) so that she can apply for help for her other medications   Case Manager Clinical Goal(s):  [name] Over the next  days, patient will work with BSW to address needs related to Financial constraints related to procuring medications and affording rent  in patient with HTN and asthma  and migraines . Patient will contact MAP for assistance with other medications  . Nurse Case Manager Clinical  Goal(s):  [name] Over the next 30 days, patient will verbalize understanding of plan for contacting appropriate patient assistance programs for ongoing medication assistance while she is uninsured . Over the next 30 days, patient will attend all scheduled medical appointments:   . Interventions:  . Assessed status of Ajovy PAP . Provided patient with the number for [place] MAP  . Advised patient to contact this CCM RN if she needs assistance obtaining her other medications   . Patient Self Care Activities:  . Patient verbalizes understanding of plan to contact medication assistance programs and/or pharmacists for ongoing assistance with maintenance medications   . Self administers medications as prescribed . Attends all scheduled provider appointments . Calls pharmacy for medication refills . Calls provider office for new concerns or questions . Unable to independently obtain maintenance medications while uninsured  Please see past updates related to this goal by clicking on the "Past Updates" button in the selected goal    I        The care management team will reach out to the patient again over the next 60-90 days.   Cranford Mon RN, CCM, CDCES CCM Clinic RN Care Manager [phone]

## 2019-11-03 NOTE — Patient Instructions (Signed)
Visit Information It was nice speaking with you today. I'm glad you were approved for Ajovy assistance.   Goals Addressed            This Visit's Progress     Patient Stated   . "I told my doctor and the social worker I cannot afford my medications." (pt-stated)       CARE PLAN ENTRY (see longitudinal plan of care for additional care plan information)   Current Barriers:  . Chronic Disease Management support, education, and care coordination needs related to HTN and Asthma and migraines- patient states she was approved for Ajovy until the end of 2021 and that she received her first dose this month, says she misplaced the number for the [place] office of the Medication Assistance Program (MAP) so that she can apply for help for her other medications   Case Manager Clinical Goal(s):  [name] Over the next  days, patient will work with BSW to address needs related to Financial constraints related to procuring medications and affording rent  in patient with HTN and asthma  and migraines . Patient will contact MAP for assistance with other medications  . Nurse Case Manager Clinical Goal(s):  [name] Over the next 30 days, patient will verbalize understanding of plan for contacting appropriate patient assistance programs for ongoing medication assistance while she is uninsured . Over the next 30 days, patient will attend all scheduled medical appointments:   . Interventions:  . Assessed patient's status in regards to [facility] Patient Assistance Program . Provided patient with the number for [place] MAP  . Advised patient to contact this CCM RN if she needs assistance obtaining her other medications   . Patient Self Care Activities:  . Patient verbalizes understanding of plan to contact medication assistance programs and/or pharmacists for ongoing assistance with maintenance medications   . Self administers medications as prescribed . Attends all scheduled provider appointments . Calls pharmacy  for medication refills . Calls provider office for new concerns or questions . Unable to independently obtain maintenance medications while uninsured  Please see past updates related to this goal by clicking on the "Past Updates" button in the selected goal           The patient verbalized understanding of instructions provided today and declined a print copy of patient instruction materials.   The care management team will reach out to the patient again over the next 60-90 days.   Cranford Mon RN, CCM, CDCES CCM Clinic RN Care Manager [phone]

## 2019-11-04 ENCOUNTER — Ambulatory Visit: Payer: Self-pay

## 2019-11-04 DIAGNOSIS — I10 Essential (primary) hypertension: Secondary | ICD-10-CM

## 2019-11-04 DIAGNOSIS — IMO0002 Reserved for concepts with insufficient information to code with codable children: Secondary | ICD-10-CM

## 2019-11-04 DIAGNOSIS — J4520 Mild intermittent asthma, uncomplicated: Secondary | ICD-10-CM

## 2019-11-04 NOTE — Patient Instructions (Signed)
Visit Information  Goals Addressed            This Visit's Progress   . COMPLETED: "I can't afford my migraine medication" (pt-stated)       Current Barriers:  [name] Knowledge Barriers related to resources and support available to address Medication procurement  Case Manager Clinical Goal(s):  [name] Over the next 30 days, patient will work with BSW to address needs related to Medication procurement . Over the next 30 days, BSW will collaborate with RN Care Manager to address care management and care coordination needs  Interventions:  . Received communication from Dr. Criselda Peaches that letter excusing patient from work has been completed.  Patient requested this due to Ajovy application still being processed.   . Informed patient that letter can be accessed via My Chart  . Patient Self Care Activities:  . Self administers medications as prescribed . Attends all scheduled provider appointments . Calls pharmacy for medication refills  Please see past updates related to this goal by clicking on the "Past Updates" button in the selected goal      . COMPLETED: "I told my doctor and the social worker I cannot afford my medications." (pt-stated)       CARE PLAN ENTRY (see longitudinal plan of care for additional care plan information)   Current Barriers:  . Chronic Disease Management support, education, and care coordination needs related to HTN and Asthma and migraines- patient states she was approved for Ajovy until the end of 2021 and that she received her first dose this month, says she misplaced the number for the [place] office of the Medication Assistance Program (MAP) so that she can apply for help for her other medications   Case Manager Clinical Goal(s):  [name] Over the next  days, patient will work with BSW to address needs related to Financial constraints related to procuring medications and affording rent  in patient with HTN and asthma  and migraines . Patient will contact MAP for  assistance with other medications  . Nurse Case Manager Clinical Goal(s):  [name] Over the next 30 days, patient will verbalize understanding of plan for contacting appropriate patient assistance programs for ongoing medication assistance while she is uninsured . Over the next 30 days, patient will attend all scheduled medical appointments:   . Interventions:  . Assessed patient's status in regards to [facility] Patient Assistance Program . Provided patient with the number for [place] MAP  . Advised patient to contact this CCM RN if she needs assistance obtaining her other medications   . Patient Self Care Activities:  . Patient verbalizes understanding of plan to contact medication assistance programs and/or pharmacists for ongoing assistance with maintenance medications   . Self administers medications as prescribed . Attends all scheduled provider appointments . Calls pharmacy for medication refills . Calls provider office for new concerns or questions . Unable to independently obtain maintenance medications while uninsured  Please see past updates related to this goal by clicking on the "Past Updates" button in the selected goal    I         The patient has been provided with contact information for the care management team and has been advised to call with any health related questions or concerns.     Malachy Chamber, BSW Embedded Care Coordination Social Worker [facility] [phone]

## 2019-11-04 NOTE — Chronic Care Management (AMB) (Signed)
Care Management   Follow Up Note   11/04/2019 Name: Stacey Price MRN: [id number] DOB: 02/20/1976  Referred by: Stacey Catalina, MD Reason for referral : Care Coordination (Medication Assistance)   Stacey Price is a 44 y.o. year old female who is a primary care patient of Stacey Catalina, MD. The care management team was consulted for assistance with care management and care coordination needs.    Review of patient status, including review of consultants reports, relevant laboratory and other test results, and collaboration with appropriate care team members and the patient's provider was performed as part of comprehensive patient evaluation and provision of chronic care management services.    SDOH (Social Determinants of Health) assessments performed: No See Care Plan activities for detailed interventions related to [facility])     Advanced Directives: See Care Plan and Vynca application for related entries.   Goals Addressed            This Visit's Progress   . COMPLETED: "I can't afford my migraine medication" (pt-stated)       Current Barriers:  Stacey Price Knowledge Barriers related to resources and support available to address Medication procurement  Case Manager Clinical Goal(s):  Stacey Price Over the next 30 days, patient will work with BSW to address needs related to Medication procurement . Over the next 30 days, BSW will collaborate with RN Care Manager to address care management and care coordination needs  Interventions:  . Received communication from Dr. Criselda Peaches that letter excusing patient from work has been completed.  Patient requested this due to Ajovy application still being processed.   . Informed patient that letter can be accessed via My Chart  . Patient Self Care Activities:  . Self administers medications as prescribed . Attends all scheduled provider appointments . Calls pharmacy for medication refills  Please see past updates related to this goal by clicking on the "Past  Updates" button in the selected goal      . COMPLETED: "I told my doctor and the social worker I cannot afford my medications." (pt-stated)       CARE PLAN ENTRY (see longitudinal plan of care for additional care plan information)   Current Barriers:  . Chronic Disease Management support, education, and care coordination needs related to HTN and Asthma and migraines- patient states she was approved for Ajovy until the end of 2021 and that she received her first dose this month, says she misplaced the number for the [place] office of the Medication Assistance Program (MAP) so that she can apply for help for her other medications   Case Manager Clinical Goal(s):  Stacey Price Over the next  days, patient will work with BSW to address needs related to Financial constraints related to procuring medications and affording rent  in patient with HTN and asthma  and migraines . Patient will contact MAP for assistance with other medications  . Nurse Case Manager Clinical Goal(s):  Stacey Price Over the next 30 days, patient will verbalize understanding of plan for contacting appropriate patient assistance programs for ongoing medication assistance while she is uninsured . Over the next 30 days, patient will attend all scheduled medical appointments:   . Interventions:  . Assessed patient's status in regards to [facility] Patient Assistance Program . Provided patient with the number for [place] MAP  . Advised patient to contact this CCM RN if she needs assistance obtaining her other medications   . Patient Self Care Activities:  . Patient verbalizes understanding of plan to  contact medication assistance programs and/or pharmacists for ongoing assistance with maintenance medications   . Self administers medications as prescribed . Attends all scheduled provider appointments . Calls pharmacy for medication refills . Calls provider office for new concerns or questions . Unable to independently obtain maintenance  medications while uninsured  Please see past updates related to this goal by clicking on the "Past Updates" button in the selected goal    I        Patient approved for Ajovy until end of the year and has received first dose.          Stacey Price, [place] Coordination Social Worker [place] [phone]

## 2019-11-07 NOTE — Progress Notes (Signed)
Internal Medicine Clinic Resident  I have personally reviewed this encounter including the documentation in this note and/or discussed this patient with the care management provider. I will address any urgent items identified by the care management provider and will communicate my actions to the patient's PCP. I have reviewed the patient's CCM visit with my supervising attending, Dr Raines.  Maiko Salais K Aileen Amore, MD 11/07/2019   

## 2019-11-07 NOTE — Progress Notes (Signed)
Internal Medicine Clinic Resident  I have personally reviewed this encounter including the documentation in this note and/or discussed this patient with the care management provider. I will address any urgent items identified by the care management provider and will communicate my actions to the patient's PCP. I have reviewed the patient's CCM visit with my supervising attending, Dr Raines.  Geovanny Sartin K Jay Haskew, MD 11/07/2019   

## 2019-11-09 ENCOUNTER — Telehealth: Payer: Self-pay

## 2019-11-09 NOTE — Telephone Encounter (Signed)
RTC, pt states she has already received a call from [place]. SChaplin, RN,BSN

## 2019-11-09 NOTE — Telephone Encounter (Signed)
Requesting to speak with a nurse about something, please call pt back.  

## 2019-11-09 NOTE — Telephone Encounter (Signed)
Pt states she has just been tested for COVID since she was in a group of people that are positive and may need a note from dr Criselda Peaches for work, she will call her work first and find what she needs then call [facility] back

## 2019-11-10 ENCOUNTER — Ambulatory Visit: Payer: Medicaid Other | Admitting: *Deleted

## 2019-11-10 DIAGNOSIS — J4520 Mild intermittent asthma, uncomplicated: Secondary | ICD-10-CM

## 2019-11-10 DIAGNOSIS — I10 Essential (primary) hypertension: Secondary | ICD-10-CM

## 2019-11-10 NOTE — Progress Notes (Signed)
Internal Medicine Clinic Attending  CCM services provided by the care management provider and their documentation were discussed with Dr. Aslam. We reviewed the pertinent findings, urgent action items addressed by the resident and non-urgent items to be addressed by the PCP.  I agree with the assessment, diagnosis, and plan of care documented in the CCM and resident's note.  Myra Weng Thomas Merleen Picazo, MD 11/10/2019  

## 2019-11-10 NOTE — Chronic Care Management (AMB) (Signed)
  Care Management   Follow Up Note   11/10/2019 Name: Stacey Price MRN: [id number] DOB: 10/04/1975  Referred by: Inez Catalina, MD Reason for referral : Care Coordination (HTN, Asthma)   Stacey Price is a 44 y.o. year old female who is a primary care patient of Inez Catalina, MD. The care management team was consulted for assistance with care management and care coordination needs.    Review of patient status, including review of consultants reports, relevant laboratory and other test results, and collaboration with appropriate care team members and the patient's provider was performed as part of comprehensive patient evaluation and provision of chronic care management services.    SDOH (Social Determinants of Health) assessments performed: No See Care Plan activities for detailed interventions related to [facility])     Advanced Directives: See Care Plan and Vynca application for related entries.   Goals Addressed            This Visit's Progress     Patient Stated   . " I was exposed to Covid and was tested but I received inconsistent information about the results and I don't know what to do. " (pt-stated)       CARE PLAN ENTRY (see longitudinal plan of care for additional care plan information)  Current Barriers:  [name] Knowledge Deficits related to Covid retesting   Nurse Case Manager Clinical Goal(s):  [name] Over the next 1 days, patient will verbalize understanding of plan for retesting for Covid  Interventions:  . Inter-disciplinary care team collaboration (see longitudinal plan of care) . Advised patient she has 2 options: assume she did test positive and self isolate for 10 days or be retested . Provided education to patient re: Covid testing sites with phone numbers in [facility]  Patient Self Care Activities:  . Patient verbalizes understanding of plan to call to schedule Covid retesting . Unable to independently problem solve regarding options when exposed to  Covid  Initial goal documentation         The care management team will reach out to the patient again over the next 7 days.   Cranford Mon RN, CCM, CDCES CCM Clinic RN Care Manager [phone]

## 2019-11-10 NOTE — Progress Notes (Signed)
Internal Medicine Clinic Resident  I have personally reviewed this encounter including the documentation in this note and/or discussed this patient with the care management provider. I will address any urgent items identified by the care management provider and will communicate my actions to the patient's PCP. I have reviewed the patient's CCM visit with my supervising attending, Dr Vincent.  Deloros Beretta, MD  Internal Medicine, PGY-1 11/10/2019    

## 2019-11-10 NOTE — Patient Instructions (Signed)
Visit Information Thank you for reaching out to me for questions related to your Covid exposure and test results. Please contact me if you have follow up questions regarding your Covid re-test  Goals Addressed            This Visit's Progress     Patient Stated   . " I was exposed to Covid and was tested but I received inconsistent information about the results and I don't know what to do. " (pt-stated)       CARE PLAN ENTRY (see longitudinal plan of care for additional care plan information)  Current Barriers:  [name] Knowledge Deficits related to Covid retesting   Nurse Case Manager Clinical Goal(s):  [name] Over the next 1 days, patient will verbalize understanding of plan for retesting for Covid  Interventions:  . Inter-disciplinary care team collaboration (see longitudinal plan of care) . Advised patient she has 2 options: assume she did test positive and self isolate for 10 days or be retested . Provided education to patient re: Covid testing sites with phone numbers in [facility]  Patient Self Care Activities:  . Patient verbalizes understanding of plan to call to schedule Covid retesting . Unable to independently problem solve regarding options when exposed to Covid  Initial goal documentation        The patient verbalized understanding of instructions provided today and declined a print copy of patient instruction materials.   The care management team will reach out to the patient again over the next 7 days.   Cranford Mon RN, CCM, CDCES CCM Clinic RN Care Manager [phone]

## 2019-11-17 ENCOUNTER — Ambulatory Visit: Payer: Medicaid Other | Admitting: *Deleted

## 2019-11-17 DIAGNOSIS — I10 Essential (primary) hypertension: Secondary | ICD-10-CM

## 2019-11-17 DIAGNOSIS — IMO0002 Reserved for concepts with insufficient information to code with codable children: Secondary | ICD-10-CM

## 2019-11-17 NOTE — Chronic Care Management (AMB) (Signed)
  Care Management   Follow Up Note   11/17/2019 Name: Stacey Price MRN: [id number] DOB: Jun 11, 1976  Referred by: Stacey Catalina, MD Reason for referral : Care Coordination (HTN, Migraines)   Stacey Price is a 44 y.o. year old female who is a primary care patient of Stacey Catalina, MD. The care management team was consulted for assistance with care management and care coordination needs.    Review of patient status, including review of consultants reports, relevant laboratory and other test results, and collaboration with appropriate care team members and the patient's provider was performed as part of comprehensive patient evaluation and provision of chronic care management services.    SDOH (Social Determinants of Health) assessments performed: No See Care Plan activities for detailed interventions related to [facility])     Advanced Directives: See Care Plan and Vynca application for related entries.   Goals Addressed            This Visit's Progress     Patient Stated   . COMPLETED: " I was exposed to Covid and was tested but I received inconsistent information about the results and I don't know what to do. " (pt-stated)       CARE PLAN ENTRY (see longitudinal plan of care for additional care plan information)  Current Barriers:  [name] Knowledge Deficits related to Covid retesting   Nurse Case Manager Clinical Goal(s):  [name] Over the next 1 days, patient will verbalize understanding of plan for retesting for Covid- pt says she decide against retesting and is self isolating, she has been in touch with the [facility] Department of Health, she reports mild alteration of smell and taste slight cough but no fever , she says she called the [place] but it is not functional, says she is taking her maintenance medications and received her 2nd dose of Ajovy in the mail yesterday from the pharmaceutical company  Interventions:  . Inter-disciplinary care team collaboration (see longitudinal  plan of care) . Assessed clinical status  Patient Self Care Activities:  . Patient verbalizes understanding of plan to call to schedule Covid retesting  but decided against it so she is self isolating per the health department guidance . Unable to independently problem solve regarding options when exposed to Covid  Please see past updates related to this goal by clicking on the "Past Updates" button in the selected goal      . "The phone number for the [place] [facility] (Medication Assistance Program) program is invalid." (pt-stated)       CARE PLAN ENTRY (see longitudinal plan of care for additional care plan information)  Current Barriers:  . Difficulty obtaining maintenance medications  Nurse Case Manager Clinical Goal(s):  [name] Over the next 30 days, patient will work with CM team pharmacist to secure source for maintenance medications  Interventions:  . Inter-disciplinary care team collaboration (see longitudinal plan of care) . Pharmacy referral for assistance with securing source for maintenance medications  Patient Self Care Activities:  . Patient verbalizes understanding of plan to work with pharmacist to assist with securing source for maintenance medications  . Unable to independently secure source for maintenance medications  Initial goal documentation         The care management team will reach out to the patient again over the next 30 days.   Stacey Mon RN, CCM, CDCES CCM Clinic RN Care Manager [phone]

## 2019-11-17 NOTE — Patient Instructions (Signed)
Visit Information  Goals Addressed            This Visit's Progress     Patient Stated   . COMPLETED: " I was exposed to Covid and was tested but I received inconsistent information about the results and I don't know what to do. " (pt-stated)       CARE PLAN ENTRY (see longitudinal plan of care for additional care plan information)  Current Barriers:  [name] Knowledge Deficits related to Covid retesting   Nurse Case Manager Clinical Goal(s):  [name] Over the next 1 days, patient will verbalize understanding of plan for retesting for Covid- pt says she decide against retesting and is self isolating, she has been in touch with the [facility] Department of Health, she reports mild alteration of smell and taste slight cough but no fever , she says she called the [place] [facility] but it is not functional, says she is taking her maintenance medications and received her 2nd dose of Ajovy in the mail yesterday from the pharmaceutical company  Interventions:  . Inter-disciplinary care team collaboration (see longitudinal plan of care) . Assessed clinical status  Patient Self Care Activities:  . Patient verbalizes understanding of plan to call to schedule Covid retesting  but decided against it so she is self isolating per the health department guidance . Unable to independently problem solve regarding options when exposed to Covid  Please see past updates related to this goal by clicking on the "Past Updates" button in the selected goal      . "The phone number for the [place] [facility] (Medication Assistance Program) program is invalid." (pt-stated)       CARE PLAN ENTRY (see longitudinal plan of care for additional care plan information)  Current Barriers:  . Difficulty obtaining maintenance medications  Nurse Case Manager Clinical Goal(s):  [name] Over the next 30 days, patient will work with CM team pharmacist to secure source for maintenance medications  Interventions:  . Inter-disciplinary care team  collaboration (see longitudinal plan of care) . Pharmacy referral for assistance with securing source for maintenance medications  Patient Self Care Activities:  . Patient verbalizes understanding of plan to work with pharmacist to assist with securing source for maintenance medications  . Unable to independently secure source for maintenance medications  Initial goal documentation        The patient verbalized understanding of instructions provided today and declined a print copy of patient instruction materials.   The care management team will reach out to the patient again over the next 30 days.   Cranford Mon RN, CCM, CDCES CCM Clinic RN Care Manager [phone]

## 2019-11-18 MED FILL — !ADVAIR 250/50 DISKUS: 250-50 | 30 days supply | Qty: 60 | Fill #0

## 2019-11-18 MED FILL — ?CITALOPRAM HBR 10 MG TABLE: 10 | 30 days supply | Qty: 30 | Fill #0

## 2019-11-18 MED FILL — ?TRAZODONE HCL 50 TABS: 50 | 30 days supply | Qty: 30 | Fill #0

## 2019-11-18 MED FILL — ?METOPROLOL SUCC ER 25MG TA: 25 | 30 days supply | Qty: 15 | Fill #0

## 2019-11-18 MED FILL — ?BUSPIRONE HCL 15 MG TABLET: 15 | 30 days supply | Qty: 45 | Fill #0

## 2019-11-18 MED FILL — !VENTOLIN HFA INHALER: 108 (90 BAS | 25 days supply | Qty: 18 | Fill #0

## 2019-11-18 NOTE — Progress Notes (Signed)
Internal Medicine Clinic Resident  I have personally reviewed this encounter including the documentation in this note and/or discussed this patient with the care management provider. I will address any urgent items identified by the care management provider and will communicate my actions to the patient's PCP. I have reviewed the patient's CCM visit with my supervising attending, Dr Narendra.  Vahini Chundi, MD 11/18/2019    

## 2019-11-22 NOTE — Progress Notes (Signed)
Internal Medicine Clinic Attending  CCM services provided by the care management provider and their documentation were discussed with Dr. Chundi. We reviewed the pertinent findings, urgent action items addressed by the resident and non-urgent items to be addressed by the PCP.  I agree with the assessment, diagnosis, and plan of care documented in the CCM and resident's note.  Gregrey Bloyd, MD 11/22/2019  

## 2019-11-25 ENCOUNTER — Telehealth: Payer: Self-pay

## 2019-11-25 NOTE — Telephone Encounter (Signed)
Pt had indicated that she was under quarantine and wanted to know the best way to get the patient assistance applications that were mailed to her back to [place].

## 2019-11-30 ENCOUNTER — Telehealth: Payer: Medicaid Other

## 2019-12-01 ENCOUNTER — Telehealth: Payer: Self-pay | Admitting: *Deleted

## 2019-12-01 DIAGNOSIS — J4520 Mild intermittent asthma, uncomplicated: Secondary | ICD-10-CM

## 2019-12-01 NOTE — Telephone Encounter (Signed)
Pt calls and ask if she could be prescribed a nebulizer due to the cough/ congestion from COVID, it seems to be irritating her asthma? Please advise

## 2019-12-02 MED ORDER — ALBUTEROL SULFATE (2.5 MG/3ML) 0.083% IN NEBU: 2.5000 mg | INHALATION_SOLUTION | Freq: Four times a day (QID) | RESPIRATORY_TRACT | 12 refills | Status: DC | PRN

## 2019-12-02 NOTE — Telephone Encounter (Signed)
Nebulizer machine and albuterol nebs ordered.  Order sheet placed in box in medical records room.   Debe Coder, MD

## 2019-12-02 NOTE — Telephone Encounter (Signed)
Routing previous notes.

## 2019-12-05 NOTE — Telephone Encounter (Signed)
Community message sent to Charlton Amor at [facility] for Nebulizer/compressor. Kinnie Feil, BSN, RN-BC

## 2019-12-05 NOTE — Telephone Encounter (Signed)
Stacey Price, Stacey Price  Estephany Perot L, RN; Stacey Price, Stacey Price; Ott, Jennifer L; Stenson, Melissa; Hague, Mamie C, NT   Got it, thanks!   

## 2019-12-05 NOTE — Telephone Encounter (Signed)
Rollene Fare, RN; Charlton Amor; [place], Cathe Mons, [place]; Ailey, Mamie C, [place]   Phone number has been updated, thanks!

## 2019-12-05 NOTE — Telephone Encounter (Signed)
Patient is aware order for neb/compressor has been sent to [facility]. She requests they call her at [phone]. CM sent to Leah at [facility] to make her aware. Kinnie Feil, BSN, RN-BC

## 2019-12-06 ENCOUNTER — Telehealth: Payer: Self-pay | Admitting: Internal Medicine

## 2019-12-06 NOTE — Telephone Encounter (Signed)
Reinaldo Raddle, Nickola Major, RN; Charlton Amor; [place], Cathe Mons, [place]; [place], Mamie C, NT   yes ma'am I reached out to our retail location and instructed them to call her ASAP.   Thanks!

## 2019-12-06 NOTE — Telephone Encounter (Signed)
Return pt's call - stated she has not received her nebulizer. I will ask Lauren to f/u.

## 2019-12-06 NOTE — Telephone Encounter (Signed)
Returned call to patient. States she still has not heard from [facility] regarding nebulizer/compressor. CM sent to Charlton Amor at [employer] for status update. Kinnie Feil, BSN, RN-BC

## 2019-12-06 NOTE — Telephone Encounter (Signed)
Pt is calling regarding nebulizer; pt contact [phone]

## 2019-12-16 ENCOUNTER — Telehealth: Payer: Medicaid Other | Admitting: *Deleted

## 2019-12-16 ENCOUNTER — Ambulatory Visit: Payer: Self-pay | Admitting: *Deleted

## 2019-12-16 NOTE — Chronic Care Management (AMB) (Signed)
  Care Management   Note  12/16/2019 Name: Stacey Price MRN: [id number] DOB: 1976/04/22  Attempted to reach patient via mobile number to complete follow up assessment including determining if she received nebulizer from [facility] and maintenance medications via working with pharmacy technician Mardee Postin.   A HIPPA compliant phone message was left for the patient providing contact information and requesting a return call.  The care management team will reach out to the patient again over the next 30-60 days.   Cranford Mon RN, CCM, CDCES CCM Clinic RN Care Manager [phone]

## 2019-12-23 ENCOUNTER — Ambulatory Visit: Payer: Self-pay | Admitting: *Deleted

## 2019-12-23 NOTE — Chronic Care Management (AMB) (Signed)
  Care Management   Outreach Note  12/23/2019 Name: Stacey Price MRN: [id number] DOB: 1976-01-14  Referred by: Inez Catalina, MD Reason for referral : Care Coordination (HTN, Asthma, migraines)   An unsuccessful telephone outreach was attempted today. The patient was referred to the case management team for assistance with care management and care coordination. This CCM RN was returning a call to the patient in response to a voice mail she left on 6/30.  Follow Up Plan: The care management team will reach out to the patient again over the next 7-14 days.   Cranford Mon RN, CCM, CDCES CCM Clinic RN Care Manager [phone]

## 2019-12-28 ENCOUNTER — Ambulatory Visit (INDEPENDENT_AMBULATORY_CARE_PROVIDER_SITE_OTHER): Payer: BC Managed Care – PPO | Admitting: Internal Medicine

## 2019-12-28 ENCOUNTER — Encounter: Payer: Self-pay | Admitting: Internal Medicine

## 2019-12-28 DIAGNOSIS — U071 COVID-19: Secondary | ICD-10-CM

## 2019-12-28 DIAGNOSIS — J4520 Mild intermittent asthma, uncomplicated: Secondary | ICD-10-CM

## 2019-12-28 DIAGNOSIS — J984 Other disorders of lung: Secondary | ICD-10-CM | POA: Diagnosis not present

## 2019-12-28 MED ORDER — FLUTICASONE-SALMETEROL 500-50 MCG/DOSE IN AEPB: 1.0000 | INHALATION_SPRAY | Freq: Two times a day (BID) | RESPIRATORY_TRACT | 3 refills | Status: DC

## 2019-12-28 NOTE — Assessment & Plan Note (Addendum)
Stacey Price is a 44 yo F w/ PMH of HTN, Asthma, Panic attacks presenting to [facility] w/ complaint of dyspnea. She states that she was in her usual state of health until she tested positive for COVID-19 pneumonia in May. Since then, she has lost her sense of smell and began to have worsening dyspnea. She mentions she has had asthma at baseline and had dyspnea with exertion but severity of her symptoms significantly worsened since her COVID infection. She mentions continuing to use her Advair as prescribed but she is unable to talk for a prolonged period or walk greater than 1 block without needing to rest. She is using her albuterol inhaler every hour or less. 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 Denies any fevers, chills, nausea, vomiting, diarrhea.  A/p Appear to be sequelae of COVID infection although unable to confirm positive COVID test on Epic. She mentions getting herself tested at outside facility. No fevers, chills, respiratory distress. No obvious sick contact to suggest re-infection.  - Referral for [place] [facility] care clinic made - Work note provided

## 2019-12-28 NOTE — Assessment & Plan Note (Deleted)
Stacey Price is a 44 yo F w/ PMH of HTN, Asthma, Panic attacks presenting to [facility] w/ complaint of dyspnea. She states that she was in her usual state of health until she tested positive for COVID-19 pneumonia in May. Since then, she has lost her sense of smell and began to have worsening dyspnea

## 2019-12-28 NOTE — Assessment & Plan Note (Addendum)
Presents for f/u management of asthma. Currently on LABA, ICS with Advair. Endorse adherence. Using albuterol inhaler multiple times a day. Concerning for inadequate control. Ambulatory pulse-ox performed in clinic with oxygenation of 99 on room air and 98 while ambulatory.  A/P Likely worsening dyspnea due to recent COVID-19. Currently on 250-26mcg. Will up-titrate maintenance regimen - Increase to 500-87mcg Advair formulation - C/w albuterol prn

## 2019-12-28 NOTE — Progress Notes (Signed)
CC: Shortness of breath  HPI: [name] is a 44 y.o. with PMH listed below presenting with complaint of shortness of breath. Please see problem based assessment and plan for further details.  Past Medical History:  Diagnosis Date  . Asthma   . Hypertension   . Migraines   . Obesity   . Panic attacks   . Peritonitis (HCC) 2014    Review of Systems: Review of Systems  Constitutional: Negative for chills, fever and malaise/fatigue.  Eyes: Negative for blurred vision.  Respiratory: Positive for shortness of breath and wheezing.   Cardiovascular: Negative for chest pain, palpitations and leg swelling.  Gastrointestinal: Negative for constipation, diarrhea, nausea and vomiting.  Genitourinary: Negative for dysuria, frequency and urgency.  Neurological: Negative for dizziness.     Physical Exam: Vitals:   12/28/19 1336 12/28/19 1408  BP: 138/76 126/78  Pulse: 65   Temp: 99.1 F (37.3 C)   TempSrc: Oral   SpO2: 100%   Weight: 268 lb (121.6 kg)   Height: 5\' 6"  (1.676 m)    Physical Exam Constitutional:      General: She is not in acute distress.    Appearance: She is obese. She is not ill-appearing.  HENT:     Mouth/Throat:     Mouth: Mucous membranes are moist.     Pharynx: Oropharynx is clear.     Comments: + stable Macroglossia Cardiovascular:     Rate and Rhythm: Normal rate and regular rhythm.     Pulses: Normal pulses.     Heart sounds: No murmur heard.   Pulmonary:     Effort: Pulmonary effort is normal. No respiratory distress.     Breath sounds: Wheezing (mild expiratory wheezes) present. No rales.  Abdominal:     General: Abdomen is flat. Bowel sounds are normal. There is no distension.     Palpations: Abdomen is soft.     Tenderness: There is no abdominal tenderness.  Musculoskeletal:        General: No swelling. Normal range of motion.  Skin:    General: Skin is warm and dry.  Neurological:     General: No focal deficit present.      Mental Status: She is alert and oriented to person, place, and time.      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 She states that she was in her usual state of health until she tested positive for COVID-19 pneumonia in May. Since then, she has lost her sense of smell and began to have worsening dyspnea. She mentions she has had asthma at baseline and had dyspnea with exertion but severity of her symptoms significantly worsened since her COVID infection. She mentions continuing to use her Advair as prescribed but she is unable to talk for a prolonged period or walk greater than 1 block without needing to rest. She is using her albuterol inhaler every hour or less. 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 Denies any fevers, chills, nausea, vomiting, diarrhea.  A/p Appear to be sequelae of COVID infection although unable to confirm positive COVID test on Epic. She mentions getting herself tested at outside facility. No fevers, chills, respiratory distress. No obvious sick contact to suggest re-infection.  -  Referral for [place] [facility] care clinic made - Work note provided  Asthma Presents for f/u management of asthma. Currently on LABA, ICS with Advair. Endorse adherence. Using albuterol inhaler multiple times a day. Concerning for inadequate control. Ambulatory pulse-ox performed in clinic with oxygenation of 99 on room air and 98 while ambulatory.  A/P Likely worsening dyspnea due to recent COVID-19. Currently on 250-65mcg. Will up-titrate maintenance regimen - Increase to 500-17mcg Advair formulation - C/w albuterol prn    Patient discussed with Dr. Criselda Peaches  -Judeth Cornfield, PGY3 [facility] Internal Medicine Pager: [phone]

## 2019-12-28 NOTE — Patient Instructions (Signed)
Thank you for allowing [place] to provide your care today. Today we discussed your breathing    I have ordered no labs for you. I will call if any are abnormal.    Today we made the following changes to your medications.    Please increase your Advair to 500-86mcg dose  Please follow-up in 3 months.    Should you have any questions or concerns please call the internal medicine clinic at [phone].    Fluticasone; Salmeterol inhalation aerosol What is this medicine? FLUTICASONE; SALMETEROL (floo TIK a sone; sal ME te role) inhalation is a combination of two medicines that decrease inflammation and help to open up the airways of your lungs. It is used to treat asthma. Do NOT use for an acute asthma attack. This medicine may be used for other purposes; ask your health care provider or pharmacist if you have questions. COMMON BRAND NAME(S): Advair HFA What should I tell my health care provider before I take this medicine? They need to know if you have any of these conditions:  bone problems  diabetes  eye disease, vision problems  immune system problems  heart disease or irregular heartbeat  high blood pressure  infection  pheochromocytoma  seizures  thyroid disease  worsening asthma  an unusual or allergic reaction to fluticasone; salmeterol, other corticosteroids, other medicines, foods, dyes, or preservatives  pregnant or trying to get pregnant  breast-feeding How should I use this medicine? This medicine is inhaled through the mouth. Follow the directions on the prescription label. Shake well for 5 seconds before each use. After using the inhaler, rinse your mouth with water. Make sure not to swallow the water. Take your medicine at regular intervals. Do not take your medicine more often than directed. Do not stop taking except on your doctor's advice. Make sure that you are using your inhaler correctly. Ask you doctor or health care provider if you have any  questions. A special MedGuide will be given to you by the pharmacist with each prescription and refill. Be sure to read this information carefully each time. Talk to your pediatrician regarding the use of this medicine in children. While this drug may be prescribed for children as young as 82 years of age for selected conditions, precautions do apply. Overdosage: If you think you have taken too much of this medicine contact a poison control center or emergency room at once. NOTE: This medicine is only for you. Do not share this medicine with others. What if I miss a dose? If you miss a dose, use it as soon as you remember. If it is almost time for your next dose, use only that dose and continue with your regular schedule, spacing doses evenly. Do not use double or extra doses. What may interact with this medicine? Do not take this medicine with any of the following medications:  MAOIs like Carbex, Eldepryl, Marplan, Nardil, and Parnate This medicine may also interact with the following medications:  aminophylline or theophylline  antiviral medicines for HIV or AIDS  beta-blockers like metoprolol and propranolol  certain antibiotics like clarithromycin, erythromycin, levofloxacin, linezolid, and telithromycin  certain medicines for fungal infections like ketoconazole, itraconazole, posaconazole, voriconazole  conivaptan  diuretics  medicines for colds  medicines for depression or emotional conditions  nefazodone  vaccines This list may not describe all possible interactions. Give your health care provider a list of all the medicines, herbs, non-prescription drugs, or dietary supplements you use. Also tell them if you smoke, drink  alcohol, or use illegal drugs. Some items may interact with your medicine. What should I watch for while using this medicine? Visit your doctor for regular check ups. Tell your doctor or health care professional if your symptoms do not get better. Do not  use this medicine more than every 12 hours. NEVER use this medicine for an acute asthma attack. You should use your short-acting rescue inhalers for this purpose. If your symptoms get worse or if you need your short-acting inhalers more often, call your doctor right away. This medicine may increase your risk of getting an infection. Tell your doctor or health care professional if you are around anyone with measles or chickenpox, or if you develop sores or blisters that do not heal properly. This medicine may increase blood sugar. Ask your healthcare provider if changes in diet or medicines are needed if you have diabetes. What side effects may I notice from receiving this medicine? Side effects that you should report to your doctor or health care professional as soon as possible:  allergic reactions like skin rash or hives, swelling of the face, lips, or tongue  breathing problems right after inhaling your medicine  chest pain  fast, irregular heartbeat  feeling faint or lightheaded, falls  fever or chills  nausea, vomiting  signs and symptoms of high blood sugar such as being more thirsty or hungry or having to urinate more than normal. You may also feel very tired or have blurry vision. Side effects that usually do not require medical attention (report to your doctor or health care professional if they continue or are bothersome):  cough  headache  nervousness  sore throat  stuffy nose  tremor This list may not describe all possible side effects. Call your doctor for medical advice about side effects. You may report side effects to FDA at 1-800-FDA-1088. Where should I keep my medicine? Keep out of the reach of children. Store at room temperature between 15 and 30 degrees C (59 and 86 degrees F). Store inhaler with the mouthpiece down. Throw away the inhaler when the dose indicator reads 000, or after the expiration date, whichever comes first. NOTE: This sheet is a summary.  It may not cover all possible information. If you have questions about this medicine, talk to your doctor, pharmacist, or health care provider.  2020 Elsevier/Gold Standard (2018-03-10 11:32:44)

## 2019-12-29 MED FILL — ADVAIR 500/50 DISKUS: 500-50 | 30 days supply | Qty: 60 | Fill #0

## 2019-12-29 MED FILL — ALBUTEROL SUL 2.5 MG/3 ML S: (2.5 MG/3ML | 6 days supply | Qty: 75 | Fill #0

## 2020-01-03 NOTE — Progress Notes (Signed)
Internal Medicine Clinic Attending  Case discussed with Dr. Lee  At the time of the visit.  We reviewed the resident's history and exam and pertinent patient test results.  I agree with the assessment, diagnosis, and plan of care documented in the resident's note.    

## 2020-01-04 ENCOUNTER — Ambulatory Visit (INDEPENDENT_AMBULATORY_CARE_PROVIDER_SITE_OTHER): Payer: BC Managed Care – PPO | Admitting: Nurse Practitioner

## 2020-01-04 ENCOUNTER — Telehealth: Payer: Medicaid Other

## 2020-01-04 ENCOUNTER — Ambulatory Visit: Payer: BC Managed Care – PPO

## 2020-01-04 ENCOUNTER — Ambulatory Visit: Payer: Self-pay | Admitting: *Deleted

## 2020-01-04 ENCOUNTER — Other Ambulatory Visit: Payer: Self-pay

## 2020-01-04 DIAGNOSIS — Z8616 Personal history of COVID-19: Secondary | ICD-10-CM | POA: Diagnosis not present

## 2020-01-04 DIAGNOSIS — J4520 Mild intermittent asthma, uncomplicated: Secondary | ICD-10-CM

## 2020-01-04 DIAGNOSIS — J984 Other disorders of lung: Secondary | ICD-10-CM

## 2020-01-04 DIAGNOSIS — R0602 Shortness of breath: Secondary | ICD-10-CM | POA: Diagnosis not present

## 2020-01-04 DIAGNOSIS — U071 COVID-19: Secondary | ICD-10-CM

## 2020-01-04 MED ORDER — PREDNISONE 10 MG PO TABS: ORAL_TABLET | ORAL | 0 refills | Status: DC

## 2020-01-04 MED ORDER — CETIRIZINE HCL 10 MG PO TABS: 10.0000 mg | ORAL_TABLET | Freq: Every day | ORAL | 11 refills | Status: DC

## 2020-01-04 MED ORDER — MONTELUKAST SODIUM 10 MG PO TABS: 10.0000 mg | ORAL_TABLET | Freq: Every day | ORAL | 3 refills | Status: DC

## 2020-01-04 MED ORDER — OMEPRAZOLE 20 MG PO CPDR: 20.0000 mg | DELAYED_RELEASE_CAPSULE | Freq: Every day | ORAL | 3 refills | Status: DC

## 2020-01-04 MED FILL — OMEPRAZOLE 20 MG CAP: 20 | 30 days supply | Qty: 30 | Fill #0

## 2020-01-04 MED FILL — MONTELUKAST SOD 10 MG TAB: 10 | 30 days supply | Qty: 30 | Fill #0

## 2020-01-04 MED FILL — predniSONE 10 MG TABS: 10 | 8 days supply | Qty: 20 | Fill #0

## 2020-01-04 NOTE — Assessment & Plan Note (Signed)
History of Asthma / asthma exacerbation - slow to resolve  Shortness of breath:  Will order chest x ray  Will order labs  Please start increased dosage of advair as prescribed by PCP - 1 puff twice daily  Please start albuterol nebulizer as prescribed by PCP - every 4-6 hours as needed for shortness of breath  Will order prednisone taper  Will order Singulair - daily at bedtime  Will order Prilosec - daily  Will order zyrtec - daily  Stay active   Deep breathing exercises  Follow up:  Follow up in 2 weeks or sooner if needed

## 2020-01-04 NOTE — Chronic Care Management (AMB) (Signed)
  Care Management   Outreach Note  01/04/2020 Name: Stacey Price MRN: [id number] DOB: [birth year]  Referred by: Inez Catalina, MD Reason for referral : Care Coordination (HTN, Asthma, migraines, s/p Covid)   An unsuccessful telephone outreach was attempted today. The patient was referred to the case management team for assistance with care management and care coordination.   Follow Up Plan: A HIPPA compliant phone message was left for the patient providing contact information and requesting a return call if she has care management needs, questions or concerns.  If no return call from patient, the care management team will reach out to the patient again over the next 30-90 days.   Cranford Mon RN, CCM, CDCES CCM Clinic RN Care Manager [phone]

## 2020-01-04 NOTE — Addendum Note (Signed)
Addended by: Ivonne Andrew on: 01/04/2020 10:27 AM   Modules accepted: Level of Service

## 2020-01-04 NOTE — Progress Notes (Addendum)
@Patient  ID: , female    DOB: 02/06/1976, 44 y.o.   MRN: [id number]  Chief Complaint  Patient presents with  . history of covid    shortness of breath    Referring provider: [id number], MD   44 year old female with history of asthma, hypertension, migraines, obesity, panic attacks.  Diagnosed with Covid May 2021.  HPI  Patient presents today for post Covid care clinic visit.  Patient was diagnosed with Covid on 11/09/2019.  Patient was not hospitalized.  She recovered at home with supportive measures.  1 month later she started developing increased shortness of breath with exertion.  She becomes short of breath when performing activities of daily living and when talking on the phone with her job.  Patient has to take frequent rest breaks.  Patient's PCP recently increased her dosage of Advair and ordered an albuterol nebulizer.  Patient has not started these new medications.  She is currently using albuterol inhaler and Advair at her previous dose.  Patient states that she does notice wheezing with exertion.  Patient has not had any labs or imaging.  Patient did have asthma prior to Covid and states that she was well controlled before Covid. Denies f/c/s, n/v/d, hemoptysis, PND, chest pain or edema.   Note: patient walked in office today - O2 sats remained above 97% for entire walk and heart rate was stable.      No Known Allergies  Immunization History  Administered Date(s) Administered  . Tdap 12/29/2016    Past Medical History:  Diagnosis Date  . Asthma   . Hypertension   . Migraines   . Obesity   . Panic attacks   . Peritonitis (HCC) 2014    Tobacco History: Social History   Tobacco Use  Smoking Status Former Smoker  . Packs/day: 1.00  . Years: 7.00  . Pack years: 7.00  . Quit date: 11/23/2014  . Years since quitting: 5.1  Smokeless Tobacco Never Used   Counseling given: Not Answered   Outpatient Encounter Medications as of 01/04/2020   Medication Sig  . AJOVY 225 MG/1.5ML SOSY INJECT 225 MG INTO THE SKIN EVERY 30 DAYS.  01/06/2020 albuterol (VENTOLIN HFA) 108 (90 Base) MCG/ACT inhaler Inhale 1-2 puffs into the lungs every 6 (six) hours as needed for wheezing or shortness of breath.  . cetirizine (ZYRTEC) 10 MG tablet Take 1 tablet (10 mg total) by mouth daily.  [name] dicyclomine (BENTYL) 20 MG tablet Take 1 tablet (20 mg total) by mouth 3 times/day as needed-between meals & bedtime (Abdominal pain).  . DULoxetine (CYMBALTA) 60 MG capsule Take 1 capsule (60 mg total) by mouth daily.  . Fluticasone-Salmeterol (ADVAIR DISKUS) 500-50 MCG/DOSE AEPB Inhale 1 puff into the lungs in the morning and at bedtime.  . metoprolol succinate (TOPROL-XL) 25 MG 24 hr tablet Take 0.5 tablets (12.5 mg total) by mouth daily.  . montelukast (SINGULAIR) 10 MG tablet Take 1 tablet (10 mg total) by mouth at bedtime.  [name] omeprazole (PRILOSEC) 20 MG capsule Take 1 capsule (20 mg total) by mouth daily.  [name] topiramate (TOPAMAX) 50 MG tablet Take 2 tablets (100 mg total) by mouth 2 (two) times daily.  . [DISCONTINUED] predniSONE (DELTASONE) 10 MG tablet Take 4 tabs for 2 days, then 3 tabs for 2 days, then 2 tabs for 2 days, then 1 tab for 2 days, then stop   No facility-administered encounter medications on file as of 01/04/2020.     Review of Systems  Review of Systems  Constitutional: Positive for fatigue. Negative for chills and fever.  HENT: Negative.   Respiratory: Positive for shortness of breath and wheezing. Negative for cough.   Cardiovascular: Negative for chest pain, palpitations and leg swelling.  Gastrointestinal: Negative.   Allergic/Immunologic: Negative.   Neurological: Negative.   Psychiatric/Behavioral: Negative.        Physical Exam  There were no vitals taken for this visit.  Wt Readings from Last 5 Encounters:  01/18/20 (!) 270 lb (122.5 kg)  12/28/19 268 lb (121.6 kg)  08/26/19 272 lb (123.4 kg)  04/29/19 253 lb 12.8 oz (115.1  kg)  04/01/19 253 lb (114.8 kg)     Physical Exam Vitals and nursing note reviewed.  Constitutional:      General: She is not in acute distress.    Appearance: She is well-developed.  Cardiovascular:     Rate and Rhythm: Normal rate and regular rhythm.  Pulmonary:     Effort: Pulmonary effort is normal.     Breath sounds: Normal breath sounds.  Neurological:     Mental Status: She is alert and oriented to person, place, and time.  Psychiatric:        Mood and Affect: Mood normal.        Behavior: Behavior normal.       Assessment & Plan:   History of COVID-19 History of Asthma / asthma exacerbation - slow to resolve  Shortness of breath:  Will order chest x ray  Will order labs  Please start increased dosage of advair as prescribed by PCP - 1 puff twice daily  Please start albuterol nebulizer as prescribed by PCP - every 4-6 hours as needed for shortness of breath  Will order prednisone taper  Will order Singulair - daily at bedtime  Will order Prilosec - daily  Will order zyrtec - daily  Stay active   Deep breathing exercises  Follow up:  Follow up in 2 weeks or sooner if needed       Ivonne Andrew, NP 01/18/2020

## 2020-01-04 NOTE — Patient Instructions (Addendum)
History of Covid  History of Asthma  Shortness of breath:  Will order chest x ray  Will order labs  Please start increased dosage of advair as prescribed by PCP - 1 puff twice daily  Please start albuterol nebulizer as prescribed by PCP - every 4-6 hours as needed for shortness of breath  Will order prednisone taper  Will order Singulair - daily at bedtime  Will order Prilosec - daily  Will order zyrtec - daily  Stay active   Deep breathing exercises  Follow up:  Follow up in 2 weeks or sooner if needed

## 2020-01-05 ENCOUNTER — Ambulatory Visit
Admission: RE | Admit: 2020-01-05 | Discharge: 2020-01-05 | Disposition: A | Discharge status: Home / Self Care | Payer: BC Managed Care – PPO | Source: Ambulatory Visit | Attending: Nurse Practitioner | Admitting: Nurse Practitioner

## 2020-01-05 DIAGNOSIS — U071 COVID-19: Secondary | ICD-10-CM | POA: Diagnosis not present

## 2020-01-05 DIAGNOSIS — J4520 Mild intermittent asthma, uncomplicated: Secondary | ICD-10-CM | POA: Diagnosis not present

## 2020-01-05 DIAGNOSIS — J984 Other disorders of lung: Secondary | ICD-10-CM | POA: Diagnosis not present

## 2020-01-05 DIAGNOSIS — Z8616 Personal history of COVID-19: Secondary | ICD-10-CM | POA: Diagnosis not present

## 2020-01-05 DIAGNOSIS — R0602 Shortness of breath: Secondary | ICD-10-CM | POA: Diagnosis not present

## 2020-01-05 LAB — D-DIMER, QUANTITATIVE: D-DIMER: 0.64 mg/L FEU — ABNORMAL HIGH (ref 0.00–0.49)

## 2020-01-09 ENCOUNTER — Other Ambulatory Visit: Payer: Self-pay | Admitting: Nurse Practitioner

## 2020-01-09 DIAGNOSIS — R0602 Shortness of breath: Secondary | ICD-10-CM

## 2020-01-10 ENCOUNTER — Other Ambulatory Visit: Payer: Self-pay | Admitting: Nurse Practitioner

## 2020-01-10 ENCOUNTER — Ambulatory Visit
Admission: RE | Admit: 2020-01-10 | Discharge: 2020-01-10 | Disposition: A | Discharge status: Home / Self Care | Payer: BC Managed Care – PPO | Source: Ambulatory Visit | Attending: Nurse Practitioner | Admitting: Nurse Practitioner

## 2020-01-10 ENCOUNTER — Other Ambulatory Visit: Payer: Self-pay

## 2020-01-10 DIAGNOSIS — R918 Other nonspecific abnormal finding of lung field: Secondary | ICD-10-CM | POA: Diagnosis not present

## 2020-01-10 DIAGNOSIS — R0602 Shortness of breath: Secondary | ICD-10-CM | POA: Diagnosis not present

## 2020-01-10 DIAGNOSIS — R911 Solitary pulmonary nodule: Secondary | ICD-10-CM | POA: Diagnosis not present

## 2020-01-10 LAB — CBC

## 2020-01-10 LAB — COMPREHENSIVE METABOLIC PANEL
ALT: 11 IU/L (ref 0–32)
AST: 14 IU/L (ref 0–40)
Albumin/Globulin Ratio: 1.3 (ref 1.2–2.2)
Albumin: 4 g/dL (ref 3.8–4.8)
Alkaline Phosphatase: 55 IU/L (ref 48–121)
BUN/Creatinine Ratio: 13 (ref 9–23)
BUN: 11 mg/dL (ref 6–24)
Bilirubin Total: 0.7 mg/dL (ref 0.0–1.2)
CO2: 22 mmol/L (ref 20–29)
Calcium: 8.8 mg/dL (ref 8.7–10.2)
Chloride: 106 mmol/L (ref 96–106)
Creatinine, Ser: 0.85 mg/dL (ref 0.57–1.00)
GFR calc Af Amer: 96 mL/min/{1.73_m2} (ref >59)
GFR calc non Af Amer: 83 mL/min/{1.73_m2} (ref >59)
Globulin, Total: 3 g/dL (ref 1.5–4.5)
Glucose: 92 mg/dL (ref 65–99)
Potassium: 4.1 mmol/L (ref 3.5–5.2)
Sodium: 139 mmol/L (ref 134–144)
Total Protein: 7 g/dL (ref 6.0–8.5)

## 2020-01-10 LAB — BRAIN NATRIURETIC PEPTIDE: BNP: 20.3 pg/mL (ref 0.0–100.0)

## 2020-01-10 MED ORDER — IOPAMIDOL (ISOVUE-370) INJECTION 76%
75.0000 mL | Freq: Once | INTRAVENOUS | Status: AC | PRN
  Administered 2020-01-10: 75 mL via INTRAVENOUS

## 2020-01-11 ENCOUNTER — Other Ambulatory Visit: Payer: Self-pay | Admitting: Nurse Practitioner

## 2020-01-11 DIAGNOSIS — J984 Other disorders of lung: Secondary | ICD-10-CM

## 2020-01-11 DIAGNOSIS — R911 Solitary pulmonary nodule: Secondary | ICD-10-CM

## 2020-01-11 DIAGNOSIS — Z87891 Personal history of nicotine dependence: Secondary | ICD-10-CM

## 2020-01-11 DIAGNOSIS — J4520 Mild intermittent asthma, uncomplicated: Secondary | ICD-10-CM

## 2020-01-11 DIAGNOSIS — U071 COVID-19: Secondary | ICD-10-CM

## 2020-01-18 ENCOUNTER — Ambulatory Visit (INDEPENDENT_AMBULATORY_CARE_PROVIDER_SITE_OTHER): Payer: BC Managed Care – PPO | Admitting: Nurse Practitioner

## 2020-01-18 ENCOUNTER — Other Ambulatory Visit: Payer: Self-pay

## 2020-01-18 DIAGNOSIS — Z8616 Personal history of COVID-19: Secondary | ICD-10-CM | POA: Diagnosis not present

## 2020-01-18 NOTE — Assessment & Plan Note (Signed)
History of COVID-19 History of Asthma / asthma exacerbation - slow to resolve Shortness of breath with exertion - could be multifactorial with obesity and deconditioning along with history of asthma:   Plan: Please keep appointment tomorrow with pulmonary - may need PFT, evaluate recent nodule found on CTA  Continue advair as prescribed by PCP - 1 puff twice daily  Continue albuterol nebulizer as prescribed by PCP - every 4-6 hours as needed for shortness of breath  Continue Singulair - daily at bedtime  Continue Prilosec - daily  Continue zyrtec - daily  Stay active   Deep breathing exercises  Follow up:  Follow up in 1 month or sooner if needed

## 2020-01-18 NOTE — Patient Instructions (Signed)
History of COVID-19 History of Asthma / asthma exacerbation - slow to resolve  Shortness of breath with exertion:  Please keep appointment tomorrow with pulmonary - may need PFT, evaluate recent nodule found on CTA  Continue advair as prescribed by PCP - 1 puff twice daily  Continue albuterol nebulizer as prescribed by PCP - every 4-6 hours as needed for shortness of breath  Continue Singulair - daily at bedtime  Continue Prilosec - daily  Continue zyrtec - daily  Stay active   Deep breathing exercises  Follow up:  Follow up in 1 month or sooner if needed

## 2020-01-18 NOTE — Progress Notes (Signed)
@Patient  ID: , female    DOB: Jul 16, 1975, 44 y.o.   MRN: [id number]  Chief Complaint  Patient presents with  . Follow-up    Still feeling SOB, is using inhaler and nebulizer, wants to know more about her CT scan results    Referring provider: [id number], MD   44 year old female former smoker with history of asthma, hypertension, migraines, obesity, panic attacks.  Diagnosed with Covid May 2021.  Recent significant events:  01/04/20 Post Covid Care Visit: history of covid, history of asthma ongoing shortness of breath - patient walked in office today - O2 sats remained above 97% for entire walk and heart rate was stable. Labs and xray ordered. D-dimer elevated - CTA ordered - no PE, but did show nodule. All other labs unremarkable. Increased dose of Advair, ordered Singulair, ordered Prilosec, Singulair, and zyrtec. Ordered prednisone taper.   Imaging:  01/05/20 Chest xray: No acute cardiopulmonary findings  01/10/20 CTA: No evidence of acute pulmonary embolism or other acute abnormality. 5 mm right lower lobe nodule.  HPI  Patient presents today for post COVID care clinic visit follow-up.  Patient states that since last visit she has started on her increased dose of Advair, Prilosec, and Singulair.  She did complete her course of prednisone prescribed at last visit.  She states that she has not noticed a difference with her shortness of breath.  She still complains of being short of breath with minimal exertion and notices wheezing.  Patient does also have albuterol nebulizer ordered as needed.  Chest x-ray at last visit showed no acute findings.  D-dimer at last visit was elevated and CTA was performed which showed no PE but did show a 5 mm right lower lobe lung nodule.  Patient is scheduled to be evaluated by pulmonary tomorrow.  Patient states that she does have a smoking history.  She states that she quit smoking 5 years ago.  She states that she did have a 7-year pack  per day smoking history prior to that.  She admits that she does still occasionally smoke cigars when she goes out drinking.  Patient does have a history of asthma but this has been mild and has been managed by her PCP.  Patient states that shortness of breath only started after diagnosed with Covid.  Patient never remembers having any PFT performed in the past.  Patient states that she has recently had issues in her home.  She noticed a hole in her wall and realized that there were birds that came in and were living behind her walls and in her ventilation system.  She has had the birds removed and the hole has been sealed but the ventilation system was not cleaned.  Denies f/c/s, n/v/d, hemoptysis, PND, chest pain or edema.      No Known Allergies  Immunization History  Administered Date(s) Administered  . Tdap 12/29/2016    Past Medical History:  Diagnosis Date  . Asthma   . Hypertension   . Migraines   . Obesity   . Panic attacks   . Peritonitis (HCC) 2014    Tobacco History: Social History   Tobacco Use  Smoking Status Former Smoker  . Packs/day: 1.00  . Years: 7.00  . Pack years: 7.00  . Types: Cigarettes  . Quit date: 11/23/2014  . Years since quitting: 5.1  Smokeless Tobacco Never Used   Counseling given: Yes   Outpatient Encounter Medications as of 01/18/2020  Medication Sig  . AJOVY  225 MG/1.5ML SOSY INJECT 225 MG INTO THE SKIN EVERY 30 DAYS.  [name] albuterol (VENTOLIN HFA) 108 (90 Base) MCG/ACT inhaler Inhale 1-2 puffs into the lungs every 6 (six) hours as needed for wheezing or shortness of breath.  . cetirizine (ZYRTEC) 10 MG tablet Take 1 tablet (10 mg total) by mouth daily.  [name] dicyclomine (BENTYL) 20 MG tablet Take 1 tablet (20 mg total) by mouth 3 times/day as needed-between meals & bedtime (Abdominal pain).  . DULoxetine (CYMBALTA) 60 MG capsule Take 1 capsule (60 mg total) by mouth daily.  . Fluticasone-Salmeterol (ADVAIR DISKUS) 500-50 MCG/DOSE AEPB Inhale 1  puff into the lungs in the morning and at bedtime.  . metoprolol succinate (TOPROL-XL) 25 MG 24 hr tablet Take 0.5 tablets (12.5 mg total) by mouth daily.  . montelukast (SINGULAIR) 10 MG tablet Take 1 tablet (10 mg total) by mouth at bedtime.  [name] omeprazole (PRILOSEC) 20 MG capsule Take 1 capsule (20 mg total) by mouth daily.  [name] topiramate (TOPAMAX) 50 MG tablet Take 2 tablets (100 mg total) by mouth 2 (two) times daily.  . [DISCONTINUED] predniSONE (DELTASONE) 10 MG tablet Take 4 tabs for 2 days, then 3 tabs for 2 days, then 2 tabs for 2 days, then 1 tab for 2 days, then stop   No facility-administered encounter medications on file as of 01/18/2020.     Review of Systems  Review of Systems  Constitutional: Positive for fatigue. Negative for chills and fever.  HENT: Negative.   Respiratory: Positive for shortness of breath. Negative for cough.   Cardiovascular: Negative.  Negative for chest pain, palpitations and leg swelling.  Gastrointestinal: Negative.   Allergic/Immunologic: Negative.   Neurological: Negative.   Psychiatric/Behavioral: Negative.        Physical Exam  BP (!) 132/74   Pulse 67   Temp 97.9 F (36.6 C)   Ht 5\' 6"  (1.676 m)   Wt (!) 270 lb (122.5 kg)   SpO2 99%   BMI 43.58 kg/m   Wt Readings from Last 5 Encounters:  01/18/20 (!) 270 lb (122.5 kg)  12/28/19 268 lb (121.6 kg)  08/26/19 272 lb (123.4 kg)  04/29/19 253 lb 12.8 oz (115.1 kg)  04/01/19 253 lb (114.8 kg)     Physical Exam Vitals and nursing note reviewed.  Constitutional:      General: She is not in acute distress.    Appearance: She is well-developed.  Cardiovascular:     Rate and Rhythm: Normal rate and regular rhythm.  Pulmonary:     Effort: Pulmonary effort is normal.     Breath sounds: Normal breath sounds. No wheezing or rhonchi.  Neurological:     Mental Status: She is alert and oriented to person, place, and time.      Imaging: DG Chest 2 View  Result Date:  01/05/2020 CLINICAL DATA:  Shortness of breath. EXAM: CHEST - 2 VIEW COMPARISON:  03/02/2019 FINDINGS: The cardiac silhouette, mediastinal and hilar contours are within normal limits and stable. The lungs are clear of an acute process. No pleural effusions. No pulmonary lesions. Stable calcified density near the distal descending aorta. The bony thorax is intact. IMPRESSION: No acute cardiopulmonary findings. Electronically Signed   By: 05/02/2019 M.D.   On: 01/05/2020 09:18   CT ANGIO CHEST PE W OR WO CONTRAST  Result Date: 01/10/2020 CLINICAL DATA:  Shortness of breath, reported history of COVID in May EXAM: CT ANGIOGRAPHY CHEST WITH CONTRAST TECHNIQUE: Multidetector CT imaging of the  chest was performed using the standard protocol during bolus administration of intravenous contrast. Multiplanar CT image reconstructions and MIPs were obtained to evaluate the vascular anatomy. CONTRAST:  65mL ISOVUE-370 IOPAMIDOL (ISOVUE-370) INJECTION 76% COMPARISON:  None. FINDINGS: Cardiovascular: Satisfactory opacification of the pulmonary arteries to the proximal segmental level. No evidence of pulmonary embolism. Normal heart size. No pericardial effusion. Mediastinum/Nodes: There are no enlarged lymph nodes identified. Few calcified nodes likely reflects sequelae of prior granulomatous disease. Thyroid and esophagus are unremarkable. Lungs/Pleura: Central airways are patent. No mass or consolidation. There is a 6 x 4 mm (average diameter 5 mm) nodule of the right lower lobe (series 9, image 77). No pleural effusion or pneumothorax. Upper Abdomen: No acute abnormality. Musculoskeletal: No acute abnormality. Review of the MIP images confirms the above findings. IMPRESSION: No evidence of acute pulmonary embolism or other acute abnormality. 5 mm right lower lobe nodule. No follow-up needed if patient is low-risk. Non-contrast chest CT can be considered in 12 months if patient is high-risk. This recommendation follows the  consensus statement: Guidelines for Management of Incidental Pulmonary Nodules Detected on CT Images: From the Fleischner Society 2017; Radiology 2017; 284:228-243. Electronically Signed   By: Guadlupe Spanish M.D.   On: 01/10/2020 10:12     Assessment & Plan:   History of COVID-19 History of COVID-19 History of Asthma / asthma exacerbation - slow to resolve Shortness of breath with exertion - could be multifactorial with obesity and deconditioning along with history of asthma:   Plan: Please keep appointment tomorrow with pulmonary - may need PFT, evaluate recent nodule found on CTA  Continue advair as prescribed by PCP - 1 puff twice daily  Continue albuterol nebulizer as prescribed by PCP - every 4-6 hours as needed for shortness of breath  Continue Singulair - daily at bedtime  Continue Prilosec - daily  Continue zyrtec - daily  Stay active   Deep breathing exercises  Follow up:  Follow up in 1 month or sooner if needed      Ivonne Andrew, NP 01/18/2020

## 2020-01-19 ENCOUNTER — Ambulatory Visit: Payer: BC Managed Care – PPO | Admitting: Pulmonary Disease

## 2020-01-19 ENCOUNTER — Encounter: Payer: Self-pay | Admitting: Pulmonary Disease

## 2020-01-19 VITALS — BP 120/78 | HR 69 | Temp 98.2°F | Ht 66.25 in | Wt 269.8 lb

## 2020-01-19 DIAGNOSIS — R0600 Dyspnea, unspecified: Secondary | ICD-10-CM

## 2020-01-19 DIAGNOSIS — M7989 Other specified soft tissue disorders: Secondary | ICD-10-CM

## 2020-01-19 DIAGNOSIS — J4540 Moderate persistent asthma, uncomplicated: Secondary | ICD-10-CM | POA: Diagnosis not present

## 2020-01-19 DIAGNOSIS — R0601 Orthopnea: Secondary | ICD-10-CM

## 2020-01-19 DIAGNOSIS — R911 Solitary pulmonary nodule: Secondary | ICD-10-CM

## 2020-01-19 DIAGNOSIS — R0609 Other forms of dyspnea: Secondary | ICD-10-CM

## 2020-01-19 DIAGNOSIS — IMO0001 Reserved for inherently not codable concepts without codable children: Secondary | ICD-10-CM

## 2020-01-19 LAB — CBC WITH DIFFERENTIAL/PLATELET
Basophils Absolute: 0 10*3/uL (ref 0.0–0.1)
Basophils Relative: 0.4 % (ref 0.0–3.0)
Eosinophils Absolute: 0.2 10*3/uL (ref 0.0–0.7)
Eosinophils Relative: 2.1 % (ref 0.0–5.0)
HCT: 36.9 % (ref 36.0–46.0)
Hemoglobin: 12.6 g/dL (ref 12.0–15.0)
Lymphocytes Relative: 25.1 % (ref 12.0–46.0)
Lymphs Abs: 1.8 10*3/uL (ref 0.7–4.0)
MCHC: 34.1 g/dL (ref 30.0–36.0)
MCV: 91.1 fl (ref 78.0–100.0)
Monocytes Absolute: 0.8 10*3/uL (ref 0.1–1.0)
Monocytes Relative: 11.7 % (ref 3.0–12.0)
Neutro Abs: 4.4 10*3/uL (ref 1.4–7.7)
Neutrophils Relative %: 60.7 % (ref 43.0–77.0)
Platelets: 192 10*3/uL (ref 150.0–400.0)
RBC: 4.05 Mil/uL (ref 3.87–5.11)
RDW: 13.9 % (ref 11.5–15.5)
WBC: 7.2 10*3/uL (ref 4.0–10.5)

## 2020-01-19 MED ORDER — BREZTRI AEROSPHERE 160-9-4.8 MCG/ACT IN AERO: 2.0000 | INHALATION_SPRAY | Freq: Two times a day (BID) | RESPIRATORY_TRACT | 11 refills | Status: DC

## 2020-01-19 MED ORDER — BREZTRI AEROSPHERE 160-9-4.8 MCG/ACT IN AERO
2.0000 | INHALATION_SPRAY | Freq: Two times a day (BID) | RESPIRATORY_TRACT | 0 refills | Status: DC
Start: 2020-01-19 — End: 2020-03-27

## 2020-01-19 NOTE — Progress Notes (Signed)
cho 

## 2020-01-19 NOTE — Progress Notes (Signed)
Patient ID: Stacey Price, female    DOB: December 08, 1975, 44 y.o.   MRN: [id number]  Chief Complaint  Patient presents with  . Consult    shortness of breath cough dry for about 2 months    Referring provider: Ivonne Andrew, NP  HPI:  [name] is a 44 year old with asthma (diagnosed around age 33) and recent COVID 50 infection (10/2019) whom we are seeing in consultation at the request of Angus Seller, NP for evaluation of dyspnea on exertion and pulmonary nodule.   Patient had COVID 19 mid May 2021. One of 10 positive cases in her social circle. She was largely bed ridden and confined to her house for about 20 days. Had severe DOE and cough. Severe symptoms have improved. However, she has moderate to severe dyspnea on exertion and residual cough. Shortness of breath worse on inclines or when carrying things. Gets better when sitting still. No clear alleviating or exacerbating factor for cough. Using albuterol several times a day. Usually not very effective. Asthma had been well controlled prior to COVID. 2-3 weeks ago her maintenance Advair diskus increased from 250-50 to 500-50. She has not seen improvement in symptoms.  She admits she was very inactive during quarantine and less active since compared to pre-Covid.   Her symptoms have been worked up by PCP. Notes reviewed in epic. She was recently seen in COVID clinic with continued work up. Note reviewed. This prompted CTA chest that on my interpretation has clear parenchyma, no PE, small ~5 mm RLL solid nodule.  PMH: Asthma, tobacco abuse in remission, uterine cancer in remission Surgical History: Hysterectomy for uterine cancer Family History: Daughter has asthma, maternal grandmother had cancer, no h/o lung cancer Social History: works for [employer] of [place], Lives in [place], former smoker ~10 pack years, occasional cigar use infrequently   Imaging: Personally reviewed DG Chest 2 View  Result Date: 01/05/2020 CLINICAL DATA:   Shortness of breath. EXAM: CHEST - 2 VIEW COMPARISON:  03/02/2019 FINDINGS: The cardiac silhouette, mediastinal and hilar contours are within normal limits and stable. The lungs are clear of an acute process. No pleural effusions. No pulmonary lesions. Stable calcified density near the distal descending aorta. The bony thorax is intact. IMPRESSION: No acute cardiopulmonary findings. Electronically Signed   By: Rudie Meyer M.D.   On: 01/05/2020 09:18   CT ANGIO CHEST PE W OR WO CONTRAST  Result Date: 01/10/2020 CLINICAL DATA:  Shortness of breath, reported history of COVID in May EXAM: CT ANGIOGRAPHY CHEST WITH CONTRAST TECHNIQUE: Multidetector CT imaging of the chest was performed using the standard protocol during bolus administration of intravenous contrast. Multiplanar CT image reconstructions and MIPs were obtained to evaluate the vascular anatomy. CONTRAST:  54mL ISOVUE-370 IOPAMIDOL (ISOVUE-370) INJECTION 76% COMPARISON:  None. FINDINGS: Cardiovascular: Satisfactory opacification of the pulmonary arteries to the proximal segmental level. No evidence of pulmonary embolism. Normal heart size. No pericardial effusion. Mediastinum/Nodes: There are no enlarged lymph nodes identified. Few calcified nodes likely reflects sequelae of prior granulomatous disease. Thyroid and esophagus are unremarkable. Lungs/Pleura: Central airways are patent. No mass or consolidation. There is a 6 x 4 mm (average diameter 5 mm) nodule of the right lower lobe (series 9, image 77). No pleural effusion or pneumothorax. Upper Abdomen: No acute abnormality. Musculoskeletal: No acute abnormality. Review of the MIP images confirms the above findings. IMPRESSION: No evidence of acute pulmonary embolism or other acute abnormality. 5 mm right lower lobe nodule. No follow-up needed  if patient is low-risk. Non-contrast chest CT can be considered in 12 months if patient is high-risk. This recommendation follows the consensus statement:  Guidelines for Management of Incidental Pulmonary Nodules Detected on CT Images: From the Fleischner Society 2017; Radiology 2017; 284:228-243. Electronically Signed   By: Guadlupe Spanish M.D.   On: 01/10/2020 10:12    Lab Results: Personally reviewed CBC    Component Value Date/Time   WBC CANCELED 01/05/2020 0834   WBC 6.7 03/02/2019 1008   RBC CANCELED 01/05/2020 0834   RBC 4.05 03/02/2019 1008   HGB CANCELED 01/05/2020 0834   HCT CANCELED 01/05/2020 0834   PLT CANCELED 01/05/2020 0834   MCV 90.9 03/02/2019 1008   MCV 90 05/06/2017 1127   MCH 31.1 03/02/2019 1008   MCHC 34.2 03/02/2019 1008   RDW 13.5 03/02/2019 1008   RDW 13.5 05/06/2017 1127   LYMPHSABS 1.7 03/02/2019 1008   LYMPHSABS 2.1 05/06/2017 1127   MONOABS 0.7 03/02/2019 1008   EOSABS 0.2 03/02/2019 1008   EOSABS 0.2 05/06/2017 1127   BASOSABS 0.0 03/02/2019 1008   BASOSABS 0.0 05/06/2017 1127    BMET    Component Value Date/Time   NA 139 01/05/2020 0834   K 4.1 01/05/2020 0834   CL 106 01/05/2020 0834   CO2 22 01/05/2020 0834   GLUCOSE 92 01/05/2020 0834   GLUCOSE 94 12/11/2017 1336   BUN 11 01/05/2020 0834   CREATININE 0.85 01/05/2020 0834   CALCIUM 8.8 01/05/2020 0834   GFRNONAA 83 01/05/2020 0834   GFRAA 96 01/05/2020 0834    BNP    Component Value Date/Time   BNP 20.3 01/05/2020 0834   BNP 32.5 03/02/2019 1008    ProBNP No results found for: PROBNP  Specialty Problems      Pulmonary Problems   Asthma   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 Unable to confirm on chart.      Shortness of breath      No Known Allergies  Immunization History  Administered Date(s) Administered  . Tdap 12/29/2016    Past Medical History:  Diagnosis Date  . Asthma   . Hypertension   . Migraines   . Obesity   . Panic attacks   . Peritonitis (HCC) 2014  . Pneumonia     Tobacco History: Social History   Tobacco Use  Smoking Status Former Smoker  .  Packs/day: 1.00  . Years: 10.00  . Pack years: 10.00  . Types: Cigarettes  Smokeless Tobacco Never Used   Counseling given: Not Answered   Outpatient Encounter Medications as of 01/19/2020  Medication Sig  . AJOVY 225 MG/1.5ML SOSY INJECT 225 MG INTO THE SKIN EVERY 30 DAYS.  [name] albuterol (VENTOLIN HFA) 108 (90 Base) MCG/ACT inhaler Inhale 1-2 puffs into the lungs every 6 (six) hours as needed for wheezing or shortness of breath.  . cetirizine (ZYRTEC) 10 MG tablet Take 1 tablet (10 mg total) by mouth daily.  [name] dicyclomine (BENTYL) 20 MG tablet Take 1 tablet (20 mg total) by mouth 3 times/day as needed-between meals & bedtime (Abdominal pain).  . DULoxetine (CYMBALTA) 60 MG capsule Take 1 capsule (60 mg total) by mouth daily.  . metoprolol succinate (TOPROL-XL) 25 MG 24 hr tablet Take 0.5 tablets (12.5 mg total) by mouth daily.  . montelukast (SINGULAIR) 10 MG tablet Take 1 tablet (10 mg total) by mouth at bedtime.  [name] omeprazole (PRILOSEC) 20 MG capsule Take 1 capsule (20  mg total) by mouth daily.  . [DISCONTINUED] Fluticasone-Salmeterol (ADVAIR DISKUS) 500-50 MCG/DOSE AEPB Inhale 1 puff into the lungs in the morning and at bedtime.  . Budeson-Glycopyrrol-Formoterol (BREZTRI AEROSPHERE) 160-9-4.8 MCG/ACT AERO Inhale 2 puffs into the lungs in the morning and at bedtime.  . Budeson-Glycopyrrol-Formoterol (BREZTRI AEROSPHERE) 160-9-4.8 MCG/ACT AERO Inhale 2 puffs into the lungs 2 (two) times daily.  [name] topiramate (TOPAMAX) 50 MG tablet Take 2 tablets (100 mg total) by mouth 2 (two) times daily.   No facility-administered encounter medications on file as of 01/19/2020.     Review of Systems  Review of Systems  Endorses orthopnea new since covid, LE swelling new since COVID, no chest pain. Comprehensive review of systems otherwise negative.  Physical Exam  BP 120/78 (BP Location: Right Arm, Cuff Size: Large)   Pulse 69   Temp 98.2 F (36.8 C) (Oral)   Ht 5' 6.25" (1.683 m)   Wt (!) 269  lb 12.8 oz (122.4 kg)   SpO2 100%   BMI 43.22 kg/m   Wt Readings from Last 5 Encounters:  01/19/20 (!) 269 lb 12.8 oz (122.4 kg)  01/18/20 (!) 270 lb (122.5 kg)  12/28/19 268 lb (121.6 kg)  08/26/19 272 lb (123.4 kg)  04/29/19 253 lb 12.8 oz (115.1 kg)    BMI Readings from Last 5 Encounters:  01/19/20 43.22 kg/m  01/18/20 43.58 kg/m  12/28/19 43.26 kg/m  08/26/19 42.60 kg/m  04/29/19 39.75 kg/m     Physical Exam General: Well appearing, in NAD Eyes: EOMI, no icterus Mouth: MMM, no lesions Respiratory: Distant breath sounds, decreased air movement, no wheeze. Cardiovascular: RRR, prominent S2, no murmurs, trace pitting edema to mid shin Abdomen/GI: BS present, NT MSK: no joint deformity, no synovitis Neuro: normal gait, CN intact Psych: Normal mood, full affect   Assessment & Plan:   Ms. Gascoigne is a 44 year old with asthma (diagnosed around age 44) and recent COVID 51 infection (10/2019) whom we are seeing in consultation at the request of Angus Seller, NP for evaluation of dyspnea on exertion and pulmonary nodule.   Suspect her symptoms are multifactorial. 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 Max dose Advair diskus not very helpful. Will escalate to triple therapy via HFA with Breztri. Obtain labs for Eos, IgE, RAST today. PFTs to be obtained next visit.  Suspect a degree of deconditioning as well given how long she was ill and lack of activity since. Encourage gradual increae in exertion.  Lastly, she has new LE edema and endorses orthopnea post COVID. Will obtain TTE to evaluate for myocarditis/structural issues leading to volume overload that could be contributing to symptoms.   Patient with single solid sub 6 mm nodule in RLL. High risk given smoking, past medical, and family history. CT cehst in 12 months per Fleischner guidelines.   Return in about 3 months (around  04/20/2020).   Karren Burly, MD 01/19/2020

## 2020-01-19 NOTE — Patient Instructions (Addendum)
Nice to meet you!  Let's increase your inhaler therapy to Breztri 2 puffs twice a day. This has an additional medicine that will hopefully help your symptoms. We have provided some samples. If too expensive, give [place] a call and we will find alternatives. Stop the Advair.   We will get labs today to see if any immune cells are making things worse.  Lastly, with the new swelling and worsening breathing when lying on your back, I ordered a heart ultrasound to make sure Covid inflammation is not causing fluid back up from the heart and contributing to your breathing issues.   We will see you back in 3 months or sooner if needed with Dr. Judeth Horn. Please let me know if I can help or if symptoms are getting worse. Call the office or send me a mychart message.

## 2020-01-20 LAB — IGE: IgE (Immunoglobulin E), Serum: 23 kU/L (ref <=114)

## 2020-01-23 ENCOUNTER — Encounter: Payer: Self-pay | Admitting: Internal Medicine

## 2020-01-23 ENCOUNTER — Telehealth: Payer: Self-pay

## 2020-01-23 NOTE — Telephone Encounter (Signed)
Spoke with pt today regarding FMLA form is completed and faxed. Pt will pick up the original copy.

## 2020-01-30 ENCOUNTER — Telehealth: Payer: Self-pay | Admitting: *Deleted

## 2020-01-30 ENCOUNTER — Telehealth: Payer: Self-pay | Admitting: Pulmonary Disease

## 2020-01-30 ENCOUNTER — Telehealth: Payer: Self-pay | Admitting: Nurse Practitioner

## 2020-01-30 NOTE — Telephone Encounter (Signed)
It looks like her appointment was moved to 8/17.  Can she come in that day.  I do not have capacity to overbook at this time.  Thank you!

## 2020-01-30 NOTE — Telephone Encounter (Signed)
Pt presents to front desk, stating that FMLA paperwork completed by dr Criselda Peaches states pt has appt 8/13, but she does not, she needs letter stating this was [facility] mistake not hers.  Would dr Criselda Peaches be willing to add her to 8/20 schedule but her schedule is at 100% as of today?

## 2020-01-30 NOTE — Telephone Encounter (Signed)
Patient called requesting work note for 01/04/20 (when she was seen here) through 01/19/20 (when she was seen by pulmonary). Advised to check in with pulmonary to be written out of work after that time frame as they see fit. Could you please make a work note for those dates. Thanks.

## 2020-01-30 NOTE — Telephone Encounter (Signed)
ATC patient LMTCB   Dr. Judeth Horn please advise Patient calling to see if Dr Judeth Horn wants pt to stay out of work until pt sees him again or what he wants her to do. Patient states Post Covid submitted dates to job but since she's seeing Dr Rexene Edison, she needs his unput. [phone]

## 2020-01-31 ENCOUNTER — Other Ambulatory Visit: Payer: Self-pay

## 2020-01-31 ENCOUNTER — Ambulatory Visit (HOSPITAL_COMMUNITY)
Admission: RE | Admit: 2020-01-31 | Discharge: 2020-01-31 | Disposition: A | Discharge status: Home / Self Care | Payer: BC Managed Care – PPO | Source: Ambulatory Visit | Attending: Pulmonary Disease | Admitting: Pulmonary Disease

## 2020-01-31 DIAGNOSIS — R0600 Dyspnea, unspecified: Secondary | ICD-10-CM | POA: Diagnosis not present

## 2020-01-31 DIAGNOSIS — Z8616 Personal history of COVID-19: Secondary | ICD-10-CM | POA: Insufficient documentation

## 2020-01-31 DIAGNOSIS — J45909 Unspecified asthma, uncomplicated: Secondary | ICD-10-CM | POA: Diagnosis not present

## 2020-01-31 DIAGNOSIS — R0609 Other forms of dyspnea: Secondary | ICD-10-CM

## 2020-01-31 DIAGNOSIS — M7989 Other specified soft tissue disorders: Secondary | ICD-10-CM

## 2020-01-31 DIAGNOSIS — R0601 Orthopnea: Secondary | ICD-10-CM | POA: Insufficient documentation

## 2020-01-31 DIAGNOSIS — I071 Rheumatic tricuspid insufficiency: Secondary | ICD-10-CM | POA: Insufficient documentation

## 2020-01-31 DIAGNOSIS — I10 Essential (primary) hypertension: Secondary | ICD-10-CM | POA: Diagnosis not present

## 2020-01-31 LAB — ECHOCARDIOGRAM COMPLETE
Area-P 1/2: 3.77 cm2
S' Lateral: 3.1 cm

## 2020-01-31 NOTE — Progress Notes (Signed)
  Echocardiogram 2D Echocardiogram has been performed.  Leta Jungling M 01/31/2020, 9:50 AM

## 2020-01-31 NOTE — Telephone Encounter (Signed)
The appt for 02/07/2020 is for telehealth only with CCM case manager. Pt do not need to come in, pt will keep that appt.

## 2020-02-01 ENCOUNTER — Encounter: Payer: Self-pay | Admitting: Nurse Practitioner

## 2020-02-01 NOTE — Telephone Encounter (Signed)
ATC patient LMTCB X2 

## 2020-02-01 NOTE — Telephone Encounter (Signed)
Hunsucker, Lesia Sago, MD  You 9 hours ago (8:09 AM)  MH It is ok for her to return to work as she is able. No covid restrictions - months since infection. Thanks! Matt   Routing comment

## 2020-02-01 NOTE — Telephone Encounter (Signed)
Pt returning missed call. States she is still having breathing issues. States she has to talk a lot on the phone during work but gets short winded. Wants to know if her pcp should extend her fmla until oct when she returns back to see Dr. Judeth Horn

## 2020-02-01 NOTE — Telephone Encounter (Signed)
ATC patient unable to reach LM to call back office (x1)  

## 2020-02-02 NOTE — Telephone Encounter (Signed)
Hunsucker, Lesia Sago, MD  You 6 minutes ago (3:32 PM)  MH Note written and sent via mychart. Thanks!   Routing comment    Called and spoke with pt letting her know that Dr. Judeth Horn sent the letter to [facility] for her and she verbalized understanding. Nothing further needed.

## 2020-02-02 NOTE — Telephone Encounter (Signed)
Patient would like to know if she needs to return back to work 02/20/2020 or next visit. Patient phone number is [phone].

## 2020-02-02 NOTE — Telephone Encounter (Signed)
Called and spoke with pt letting her know the info stated by Dr. Judeth Horn. Pt wanted to know if a letter could be written stating for her to return back to work 8/30 and she also needs to state in the letter that she has no restrictions.  Dr. Judeth Horn, please advise if you are okay with [place] writing this letter for pt. Pt stated that we can submit the letter through to her via mychart and she will print the letter off from there.

## 2020-02-03 ENCOUNTER — Telehealth: Payer: Medicaid Other

## 2020-02-07 ENCOUNTER — Ambulatory Visit: Payer: BC Managed Care – PPO | Admitting: *Deleted

## 2020-02-07 DIAGNOSIS — I10 Essential (primary) hypertension: Secondary | ICD-10-CM

## 2020-02-07 DIAGNOSIS — G43709 Chronic migraine without aura, not intractable, without status migrainosus: Secondary | ICD-10-CM

## 2020-02-07 DIAGNOSIS — IMO0002 Reserved for concepts with insufficient information to code with codable children: Secondary | ICD-10-CM

## 2020-02-07 DIAGNOSIS — U071 COVID-19: Secondary | ICD-10-CM

## 2020-02-07 DIAGNOSIS — J4520 Mild intermittent asthma, uncomplicated: Secondary | ICD-10-CM

## 2020-02-07 NOTE — Patient Instructions (Signed)
Visit Information It was nice speaking with you today. I am sorry you are still having shortness of breath.   Goals Addressed              This Visit's Progress     Patient Stated   .  "I'm doing about the same; I still get short of breath very easily." (pt-stated)        CARE PLAN ENTRY (see longitudinal plan of care for additional care plan information)  Current Barriers:  . 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 - patient states she is still feeling short of breath with minimal exertion. She says her migraines have been well controlled on the Ajovy but she was told by the Ajovy patient assistance program that she will no longer qualify for Ajovy patient assistance but she doesn't know why. She says she is going to call them today to find out.   Nurse Case Manager Clinical Goal(s):  [name] Over the next 30-60 days, the patient will demonstrate ongoing self health care management ability as evidenced by contacting the appropriate providers for new concerns or problems related  to HTN, migraines and dyspnea from asthma or post Covid pulmonary comorbidity  Interventions:  . Prior to interviewing patient, reviewed patient status, including review of recent office visit notes, consultants reports, relevant laboratory and other test results, and medications, completed. . Evaluation of current treatment plan related to HTN, asthma., post Covid pulmonary comorbidity and migraines and patient's adherence to plan as established by provider. . Reviewed medications with patient and discussed medication taking behavior . Positive reinforcement provided for establishing care with pulmonologist for her ongoing respiratory issues . Discussed plans with patient for ongoing care management follow up and provided patient with direct contact information for care management team . Inter-disciplinary care team collaboration (see longitudinal plan of  care)- advised patient to contact this CCM RN or the clinic if she helps in getting ongoing approval for Ajovy  Patient Self Care Activities:  . Self administers medications as prescribed . Attends all scheduled provider appointments . Calls pharmacy for medication refills . Performs ADL's independently . Performs IADL's independently . Calls provider office for new concerns or questions .   Initial goal documentation        The patient verbalized understanding of instructions provided today and declined a print copy of patient instruction materials.   The care management team will reach out to the patient again over the next 30-60 days.   Cranford Mon RN, CCM, CDCES CCM Clinic RN Care Manager [phone]

## 2020-02-07 NOTE — Progress Notes (Signed)
Internal Medicine Clinic Resident ° °I have personally reviewed this encounter including the documentation in this note and/or discussed this patient with the care management provider. I will address any urgent items identified by the care management provider and will communicate my actions to the patient's PCP. I have reviewed the patient's CCM visit with my supervising attending, Dr Raines. ° °Melda Mermelstein, MD °02/07/2020 ° ° ° °

## 2020-02-07 NOTE — Chronic Care Management (AMB) (Signed)
  Care Management   Follow Up Note   02/07/2020 Name: Stacey Price MRN: [id number] DOB: Jul 03, 1975  Referred by: Inez Catalina, MD Reason for referral : Care Coordination (HTN, Asthma)   Stacey Price is a 44 y.o. year old female who is a primary care patient of Inez Catalina, MD. The care management team was consulted for assistance with care management and care coordination needs.    Review of patient status, including review of consultants reports, relevant laboratory and other test results, and collaboration with appropriate care team members and the patient's provider was performed as part of comprehensive patient evaluation and provision of chronic care management services.    SDOH (Social Determinants of Health) assessments performed: No See Care Plan activities for detailed interventions related to [facility])     Advanced Directives: See Care Plan and Vynca application for related entries.   Goals Addressed              This Visit's Progress     Patient Stated   .  "I'm doing about the same; I still get short of breath very easily." (pt-stated)        CARE PLAN ENTRY (see longitudinal plan of care for additional care plan information)  Current Barriers:  . Chronic Disease Management support and education needs related to HTN, asthma with post Covid pulmonary comorbidity. - patient states she is still feeling short of breath with minimal exertion. She says her migraines have been well controlled on the Ajovy but she was told by the Ajovy patient assistance program that she will no longer qualify for Ajovy patient assistance but she doesn't know why. She says she is going to call them today to find out.   Nurse Case Manager Clinical Goal(s):  [name] Over the next 30-60 days, the patient will demonstrate ongoing self health care management ability as evidenced by contacting the appropriate providers for new concerns or problems related  to HTN, migraines and dyspnea from asthma or  post Covid pulmonary comorbidity  Interventions:  . Prior to interviewing patient, reviewed patient status, including review of recent office visit notes, consultants reports, relevant laboratory and other test results, and medications, completed. . Evaluation of current treatment plan related to HTN,  Asthma, post Covid pulmonary comorbidity and  migraines and patient's adherence to plan as established by provider. . Reviewed medications with patient and discussed medication taking behavior . Positive reinforcement provided for establishing care with pulmonologist for her ongoing respiratory issues . Discussed plans with patient for ongoing care management follow up and provided patient with direct contact information for care management team . Inter-disciplinary care team collaboration (see longitudinal plan of care)- advised patient to contact this CCM RN or the clinic if she helps in getting ongoing approval for Ajovy  Patient Self Care Activities:  . Self administers medications as prescribed . Attends all scheduled provider appointments . Calls pharmacy for medication refills . Performs ADL's independently . Performs IADL's independently . Calls provider office for new concerns or questions .   Initial goal documentation         The care management team will reach out to the patient again over the next 30-90 days.   Cranford Mon RN, CCM, CDCES CCM Clinic RN Care Manager [phone]

## 2020-02-08 NOTE — Progress Notes (Signed)
Internal Medicine Clinic Attending  CCM services provided by the care management provider and their documentation were reviewed with Dr. Masoudi.  We reviewed the pertinent findings, urgent action items addressed by the resident and non-urgent items to be addressed by the PCP.  I agree with the assessment, diagnosis, and plan of care documented in the CCM and resident's note.  Alexander N Raines, MD 02/08/2020  

## 2020-03-01 ENCOUNTER — Institutional Professional Consult (permissible substitution): Payer: BC Managed Care – PPO | Admitting: Emergency Medicine

## 2020-03-14 ENCOUNTER — Other Ambulatory Visit: Payer: Self-pay | Admitting: Internal Medicine

## 2020-03-14 DIAGNOSIS — Z1231 Encounter for screening mammogram for malignant neoplasm of breast: Secondary | ICD-10-CM

## 2020-03-15 MED FILL — AJOVY 225 MG/1.5ML SOSY: 225 | 30 days supply | Qty: 2 | Fill #0

## 2020-03-19 ENCOUNTER — Ambulatory Visit (INDEPENDENT_AMBULATORY_CARE_PROVIDER_SITE_OTHER): Payer: BC Managed Care – PPO | Admitting: Internal Medicine

## 2020-03-19 ENCOUNTER — Other Ambulatory Visit: Payer: Self-pay

## 2020-03-19 ENCOUNTER — Encounter: Payer: Self-pay | Admitting: Internal Medicine

## 2020-03-19 VITALS — BP 138/72 | HR 66 | Temp 98.1°F | Ht 66.4 in | Wt 273.0 lb

## 2020-03-19 DIAGNOSIS — S21009A Unspecified open wound of unspecified breast, initial encounter: Secondary | ICD-10-CM

## 2020-03-19 DIAGNOSIS — N6489 Other specified disorders of breast: Secondary | ICD-10-CM | POA: Diagnosis not present

## 2020-03-19 DIAGNOSIS — R234 Changes in skin texture: Secondary | ICD-10-CM

## 2020-03-19 NOTE — Progress Notes (Signed)
Office Visit   Patient ID: Stacey Price, female    DOB: January 21, 1976, 44 y.o.   MRN: [id number]  Subjective:  CC: left  breast lesion  HPI 44 y.o. presents today for the above concern.  She notes that it was first noted about 1w ago and has grown in size since then. She notes that it is crust appearing but denies any recent drainage. There was initially no associated pain however has become pretty uncomfortable over the past 2 days. She denies any recent trauma to the breast. She does not breastfeed. Denies nipple discharge. Denies fever, chills, or axillary pain. She no longer menstruates s/p hysterectomy No personal history of breast cancer FH significant for breast cancer in a paternal grandmother.      ACTIVE MEDICATIONS   Current Outpatient Medications on File Prior to Visit  Medication Sig Dispense Refill  . AJOVY 225 MG/1.5ML SOSY INJECT 225 MG INTO THE SKIN EVERY 30 DAYS. 1.5 mL 4  . albuterol (VENTOLIN HFA) 108 (90 Base) MCG/ACT inhaler Inhale 1-2 puffs into the lungs every 6 (six) hours as needed for wheezing or shortness of breath. 18 g 6  . Budeson-Glycopyrrol-Formoterol (BREZTRI AEROSPHERE) 160-9-4.8 MCG/ACT AERO Inhale 2 puffs into the lungs in the morning and at bedtime. 10.7 g 11  . Budeson-Glycopyrrol-Formoterol (BREZTRI AEROSPHERE) 160-9-4.8 MCG/ACT AERO Inhale 2 puffs into the lungs 2 (two) times daily. 5.9 g 0  . cetirizine (ZYRTEC) 10 MG tablet Take 1 tablet (10 mg total) by mouth daily. 30 tablet 11  . dicyclomine (BENTYL) 20 MG tablet Take 1 tablet (20 mg total) by mouth 3 times/day as needed-between meals & bedtime (Abdominal pain). 20 tablet 0  . DULoxetine (CYMBALTA) 60 MG capsule Take 1 capsule (60 mg total) by mouth daily. 30 capsule 3  . metoprolol succinate (TOPROL-XL) 25 MG 24 hr tablet Take 0.5 tablets (12.5 mg total) by mouth daily. 30 tablet 2  . montelukast (SINGULAIR) 10 MG tablet Take 1 tablet (10 mg total) by mouth at bedtime. 30 tablet 3  .  omeprazole (PRILOSEC) 20 MG capsule Take 1 capsule (20 mg total) by mouth daily. 30 capsule 3  . topiramate (TOPAMAX) 50 MG tablet Take 2 tablets (100 mg total) by mouth 2 (two) times daily. 120 tablet 0   No current facility-administered medications on file prior to visit.    ROS  Review of Systems  Constitutional: Negative for chills and fever.  Respiratory: Negative.   Skin: Positive for wound.  Neurological: Negative.     Objective:   BP 138/72 (BP Location: Left Arm, Patient Position: Sitting, Cuff Size: Large)   Pulse 66   Temp 98.1 F (36.7 C) (Oral)   Ht 5' 6.4" (1.687 m)   Wt 273 lb (123.8 kg)   SpO2 100% Comment: room air  BMI 43.53 kg/m  Wt Readings from Last 3 Encounters:  03/19/20 273 lb (123.8 kg)  01/19/20 (!) 269 lb 12.8 oz (122.4 kg)  01/18/20 (!) 270 lb (122.5 kg)   BP Readings from Last 3 Encounters:  03/19/20 138/72  01/19/20 120/78  01/18/20 (!) 132/74   Physical Exam Constitutional:      Appearance: Normal appearance. She is not ill-appearing.  Cardiovascular:     Rate and Rhythm: Normal rate.  Pulmonary:     Effort: Pulmonary effort is normal.     Breath sounds: Normal breath sounds.  Chest:       Comments: Circularly shaped lesion located on the left breast at the 3  o'clock position approx 2cm from the nipple and within the aereola. Skin appears thickened and dry. No erythema or drainage. Tenderness present extending distally from the lesion as well as at the lesion itself. No nipple discharge present. No nipple inversion. No abscess appreciated with palpation      Health Maintenance:   Health Maintenance  Topic Date Due  . Hepatitis C Screening  Never done  . COVID-19 Vaccine (1) Never done  . INFLUENZA VACCINE  Never done  . TETANUS/TDAP  12/30/2026  . HIV Screening  Completed     Assessment & Plan:   Problem List Items Addressed This Visit      Other   Breast skin changes    1 week history of a skin lesion of the left  breast. Chart review indicates that this occurred in 2017 at which time mammogram was negative. Exam concerning for inflammatory cutaneous disease. Low suspicion for infection based on exam and lack of systemic symptoms.  Last mammogram was in 2019--BI-RADS 1 Screening mammogram scheduled for 10/7 Plan --change 10/7 mammogram from screening to diagnostic --left breast US ordered --tylenol and ibuprofen for pain --return precautions discussed in detail with pt --f/u after imaging is complete       Other Visit Diagnoses    Breast wound, initial encounter    -  Primary   Relevant Orders   US BREAST LTD UNI LEFT INC AXILLA   MM Digital Diagnostic Bilat        Pt discussed with Fredrich Romans, MD Internal Medicine Resident PGY-2 [facility] Pager: [phone] 03/20/2020 10:47 AM

## 2020-03-20 NOTE — Assessment & Plan Note (Signed)
1 week history of a skin lesion of the left breast. Chart review indicates that this occurred in 2017 at which time mammogram was negative. Exam concerning for inflammatory cutaneous disease. Low suspicion for infection based on exam and lack of systemic symptoms.  Last mammogram was in 2019--BI-RADS 1 Screening mammogram scheduled for 10/7 Plan --change 10/7 mammogram from screening to diagnostic --left breast US ordered --tylenol and ibuprofen for pain --return precautions discussed in detail with pt --f/u after imaging is complete

## 2020-03-20 NOTE — Progress Notes (Signed)
Internal Medicine Clinic Attending  I saw and evaluated the patient.  I personally confirmed the key portions of the history and exam documented by Dr. Ephriam Knuckles and I reviewed pertinent patient test results.  The assessment, diagnosis, and plan were formulated together and I agree with the documentation in the resident's note. Skin changes of the areola consist of thickened, rough appearing plaque, no drainage. As Dr. Ephriam Knuckles has noted, there is no associated induration, erythema, heat, or swelling; no peau d'orange changes.  She is tender, however, in the deep tissues cephalad into the upper outer quadrant.  Appearance is not consistent with an infection.  Further evaluation is necessary, including possible biopsy.  Will begin with [place] as compression with mammography may be prohibitively painful.

## 2020-03-26 ENCOUNTER — Telehealth: Payer: Self-pay

## 2020-03-26 ENCOUNTER — Other Ambulatory Visit: Payer: Self-pay

## 2020-03-26 ENCOUNTER — Ambulatory Visit
Admission: RE | Admit: 2020-03-26 | Discharge: 2020-03-26 | Disposition: A | Discharge status: Home / Self Care | Payer: BC Managed Care – PPO | Source: Ambulatory Visit | Attending: Internal Medicine | Admitting: Internal Medicine

## 2020-03-26 DIAGNOSIS — N6489 Other specified disorders of breast: Secondary | ICD-10-CM | POA: Diagnosis not present

## 2020-03-26 DIAGNOSIS — S21009A Unspecified open wound of unspecified breast, initial encounter: Secondary | ICD-10-CM

## 2020-03-26 DIAGNOSIS — R928 Other abnormal and inconclusive findings on diagnostic imaging of breast: Secondary | ICD-10-CM | POA: Diagnosis not present

## 2020-03-26 NOTE — Telephone Encounter (Signed)
Requesting to speak with a nurse about getting pain med, please call back.

## 2020-03-26 NOTE — Telephone Encounter (Signed)
RTC, VM obtained and message left to call triage nurse back. SChaplin, RN,BSN  

## 2020-03-27 ENCOUNTER — Ambulatory Visit: Payer: BC Managed Care – PPO | Admitting: *Deleted

## 2020-03-27 ENCOUNTER — Ambulatory Visit: Payer: BC Managed Care – PPO | Admitting: Internal Medicine

## 2020-03-27 ENCOUNTER — Encounter: Payer: Self-pay | Admitting: Internal Medicine

## 2020-03-27 VITALS — BP 118/76 | HR 85 | Temp 98.4°F | Ht 66.0 in | Wt 272.5 lb

## 2020-03-27 DIAGNOSIS — R234 Changes in skin texture: Secondary | ICD-10-CM | POA: Diagnosis not present

## 2020-03-27 DIAGNOSIS — L309 Dermatitis, unspecified: Secondary | ICD-10-CM

## 2020-03-27 DIAGNOSIS — J984 Other disorders of lung: Secondary | ICD-10-CM

## 2020-03-27 DIAGNOSIS — U071 COVID-19: Secondary | ICD-10-CM

## 2020-03-27 DIAGNOSIS — I10 Essential (primary) hypertension: Secondary | ICD-10-CM

## 2020-03-27 DIAGNOSIS — J4520 Mild intermittent asthma, uncomplicated: Secondary | ICD-10-CM

## 2020-03-27 MED ORDER — TRIAMCINOLONE ACETONIDE 0.1 % EX OINT: TOPICAL_OINTMENT | Freq: Two times a day (BID) | CUTANEOUS | Status: DC

## 2020-03-27 NOTE — Assessment & Plan Note (Addendum)
Stacey Price is a 44 yo F w/ PMH of MD, HTN, Asthma presenting with breast pain. She was in her usual state of health until 2 weeks ago when she had gradual onset L breast skin changes associated with throbbing pain and pruritus. She denies any obvious inciting factors, no new bra or detergent or clothing. She denies any nipple discharge, fevers, chills, nausea, vomiting. She mentions radiation of the pain up to her shoulder. Had minimal alleviation with tylenol and ibuprofen. She was seen by Dr.Christian about a week prior and was advised to get diagnostic mammogram and ultrasound which were both negative. She returns to clinic for f/u due to worsening pain and irritation.  A/P Present w/ scaly raised rash on L nipple with tenderness. Soft tissue involvement ruled out with negative ultrasound and mammogram. Possibly due to nipple dermatitis based on location. Other differential includes fungal vs bacterial infection. Will treat empirically with topical steroid and assess for effect. May need referral to derm for biopsy if worsening. - Start triamcinolone 0.1% ointment BID

## 2020-03-27 NOTE — Patient Instructions (Signed)
Thank you for allowing [place] to provide your care today. Today we discussed your breast pain    I have ordered no labs for you. I will call if any are abnormal.    Today we made the following changes to your medications.    Please start triamcinolone ointment twice daily  Please follow-up if your symptoms worsen.    Should you have any questions or concerns please call the internal medicine clinic at [phone].     Eczema Eczema is a broad term for a group of skin conditions that cause skin to become rough and inflamed. Each type of eczema has different triggers, symptoms, and treatments. Eczema of any type is usually itchy and symptoms range from mild to severe. Eczema and its symptoms are not spread from person to person (are not contagious). It can appear on different parts of the body at different times. Your eczema may not look the same as someone else's eczema. What are the types of eczema? Atopic dermatitis This is a long-term (chronic) skin disease that keeps coming back (recurring). Usual symptoms are dry skin and small, solid pimples that may swell and leak fluid (weep). Contact dermatitis  This happens when something irritates the skin and causes a rash. The irritation can come from substances that you are allergic to (allergens), such as poison ivy, chemicals, or medicines that were applied to your skin. Dyshidrotic eczema This is a form of eczema on the hands and feet. It shows up as very itchy, fluid-filled blisters. It can affect people of any age, but is more common before age 64. Hand eczema  This causes very itchy areas of skin on the palms and sides of the hands and fingers. This type of eczema is common in industrial jobs where you may be exposed to many different types of irritants. Lichen simplex chronicus This type of eczema occurs when a person constantly scratches one area of the body. Repeated scratching of the area leads to thickened skin (lichenification).  Lichen simplex chronicus can occur along with other types of eczema. It is more common in adults, but may be seen in children as well. Nummular eczema This is a common type of eczema. It has no known cause. It typically causes a red, circular, crusty lesion (plaque) that may be itchy. Scratching may become a habit and can cause bleeding. Nummular eczema occurs most often in people of middle-age or older. It most often affects the hands. Seborrheic dermatitis This is a common skin disease that mainly affects the scalp. It may also affect any oily areas of the body, such as the face, sides of nose, eyebrows, ears, eyelids, and chest. It is marked by small scaling and redness of the skin (erythema). This can affect people of all ages. In infants, this condition is known as Location manager." Stasis dermatitis This is a common skin disease that usually appears on the legs and feet. It most often occurs in people who have a condition that prevents blood from being pumped through the veins in the legs (chronic venous insufficiency). Stasis dermatitis is a chronic condition that needs long-term management. How is eczema diagnosed? Your health care provider will examine your skin and review your medical history. He or she may also give you skin patch tests. These tests involve taking patches that contain possible allergens and placing them on your back. He or she will then check in a few days to see if an allergic reaction occurred. What are the common treatments? Treatment  for eczema is based on the type of eczema you have. Hydrocortisone steroid medicine can relieve itching quickly and help reduce inflammation. This medicine may be prescribed or obtained over-the-counter, depending on the strength of the medicine that is needed. Follow these instructions at home:  Take over-the-counter and prescription medicines only as told by your health care provider.  Use creams or ointments to moisturize your skin. Do not  use lotions.  Learn what triggers or irritates your symptoms. Avoid these things.  Treat symptom flare-ups quickly.  Do not itch your skin. This can make your rash worse.  Keep all follow-up visits as told by your health care provider. This is important. Where to find more information  The American Academy of Dermatology: InfoExam.si  The National Eczema Association: www.nationaleczema.org Contact a health care provider if:  You have serious itching, even with treatment.  You regularly scratch your skin until it bleeds.  Your rash looks different than usual.  Your skin is painful, swollen, or more red than usual.  You have a fever. Summary  There are eight general types of eczema. Each type has different triggers.  Eczema of any type causes itching that may range from mild to severe.  Treatment varies based on the type of eczema you have. Hydrocortisone steroid medicine can help with itching and inflammation.  Protecting your skin is the best way to prevent eczema. Use moisturizers and lotions. Avoid triggers and irritants, and treat flare-ups quickly. This information is not intended to replace advice given to you by your health care provider. Make sure you discuss any questions you have with your health care provider. Document Revised: 05/22/2017 Document Reviewed: 10/23/2016 Elsevier Patient Education  2020 [employer].

## 2020-03-27 NOTE — Progress Notes (Signed)
   CC: breast pain  HPI: Ms.Lenni Betley is a 44 y.o. with PMH listed below presenting with complaint of breast pain. Please see problem based assessment and plan for further details.  Past Medical History:  Diagnosis Date  . Asthma   . Hypertension   . Migraines   . Obesity   . Panic attacks   . Peritonitis (HCC) 2014  . Pneumonia    Review of Systems: Review of Systems  Constitutional: Negative for chills, fever and malaise/fatigue.  Respiratory: Negative for shortness of breath.   Cardiovascular: Negative for chest pain.  Gastrointestinal: Negative for diarrhea, nausea and vomiting.  Skin: Positive for itching and rash.  All other systems reviewed and are negative.    Physical Exam: Vitals:   03/27/20 1431  BP: 118/76  Pulse: 85  Temp: 98.4 F (36.9 C)  TempSrc: Oral  SpO2: 100%  Weight: 272 lb 8 oz (123.6 kg)  Height: 5\' 6"  (1.676 m)   Gen: Well-developed, well nourished, NAD HEENT: NCAT head, hearing intact CV: RRR, S1, S2 normal Extm: ROM intact, Peripheral pulses intact, No peripheral edema Skin: Dry, Warm, normal turgor, Area of raised, scaly, hyperpigmented lesion on lateral side of L areola with tenderness to palpation without significant surrounding erythema or warmth  Assessment & Plan:   Nipple dermatitis Ms.Gambrell is a 44 yo F w/ PMH of MD, HTN, Asthma presenting with breast pain. She was in her usual state of health until 2 weeks ago when she had gradual onset L breast skin changes associated with throbbing pain and pruritus. She denies any obvious inciting factors, no new bra or detergent or clothing. She denies any nipple discharge, fevers, chills, nausea, vomiting. She mentions radiation of the pain up to her shoulder. Had minimal alleviation with tylenol and ibuprofen. She was seen by Dr.Christian about a week prior and was advised to get diagnostic mammogram and ultrasound which were both negative. She returns to clinic for f/u due to worsening pain  and irritation.  A/P Present w/ scaly raised rash on L nipple with tenderness. Soft tissue involvement ruled out with negative ultrasound and mammogram. Possibly due to nipple dermatitis based on location. Other differential includes fungal vs bacterial infection. Will treat empirically with topical steroid and assess for effect. May need referral to derm for biopsy if worsening. - Start triamcinolone 0.1% ointment BID   Patient seen with Dr. 49  -Oswaldo Done, PGY3 [facility] Internal Medicine Pager: [phone]

## 2020-03-27 NOTE — Progress Notes (Signed)
Internal Medicine Clinic Resident  I have personally reviewed this encounter including the documentation in this note and/or discussed this patient with the care management provider. I will address any urgent items identified by the care management provider and will communicate my actions to the patient's PCP. I have reviewed the patient's CCM visit with my supervising attending, Dr Vincent.  Emie Sommerfeld, MD  [facility] PGY-2 03/27/2020    

## 2020-03-27 NOTE — Telephone Encounter (Signed)
Pt called back, states left breast pain has increased, denies any erythema or drainage to area and only reports an area that is "scaly" from the side of her left breast to the areola.  Reports she had her MMG yesterday.  She is wanting pain medication. Appt made for today at 2:15 yellow team/Dr.Lee SChaplin, RN,BSN

## 2020-03-27 NOTE — Chronic Care Management (AMB) (Signed)
Care Management   Follow Up Note   03/27/2020 Name: Stacey Price MRN: [id number] DOB: 23-May-1976  Referred by: Inez Catalina, MD Reason for referral : Care Coordination (HTN, Asthma, migraines, obesity, post Covid dyspnea)   Stacey Price is a 44 y.o. year old female who is a primary care patient of Inez Catalina, MD. The care management team was consulted for assistance with care management and care coordination needs.    Review of patient status, including review of consultants reports, relevant laboratory and other test results, and collaboration with appropriate care team members and the patient's provider was performed as part of comprehensive patient evaluation and provision of chronic care management services.    SDOH (Social Determinants of Health) assessments performed: No   See Care Plan activities for detailed interventions related to [facility])     Advanced Directives: See Care Plan and Vynca application for related entries.   Goals Addressed              This Visit's Progress     Patient Stated   .  "I'm doing about the same; I still get short of breath very easily." (pt-stated)        CARE PLAN ENTRY (see longitudinal plan of care for additional care plan information)  Current Barriers:  . 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 - patient states she is still feeling short of breath with minimal exertion. She says she sees Dr. Judeth Horn on a regular basis for treatment of her asthma and post Covid dyspnea. She says her migraines have not been well controlled despite the Ajovy, she says she pick up her last dose today because she no longer qualifies for the Ajovy patient assistance program because her health insurance has been reinstated  Nurse Case Manager Clinical Goal(s):  [name] Over the next 30-60 days, the patient will demonstrate ongoing self health care management ability as evidenced by contacting  the appropriate providers for new concerns or problems related  to HTN, migraines and dyspnea from asthma or post Covid pulmonary comorbidity  Interventions:  . Prior to interviewing patient, reviewed patient status, including review of recent office visit notes, consultants reports, relevant laboratory and other test results, and medications, completed. . Evaluation of current treatment plan related to HTN, asthma., post Covid pulmonary comorbidity and migraines and patient's adherence to plan as established by provider. . Reviewed medications with patient and discussed medication taking behavior . Reviewed treatment and action plan for asthma and post Covid dyspnea per Dr Judeth Horn ( pulmonologist)  . Advised patient that clinic has hired a Teacher, early years/pre and behavioral health counselor should she request or need those services in the future . Discussed plans with patient for ongoing care management follow up and ensured patient has contact number for CCM team and clinic . Inter-disciplinary care team collaboration (see longitudinal plan of care)- advised patient to contact this CCM RN or the clinic if she helps in getting ongoing approval for Ajovy  Patient Self Care Activities:  . Self administers medications as prescribed . Attends all scheduled provider appointments . Calls pharmacy for medication refills . Performs ADL's independently . Performs IADL's independently . Calls provider office for new concerns or questions .   Please see past updates related to this goal by clicking on the "Past Updates" button in the selected goal          The care management team will reach out to the patient again  over the next 30-60 days.   [name] RN, CCM, [facility] RN Care Manager [phone]

## 2020-03-27 NOTE — Telephone Encounter (Signed)
REC, no answer, no VM, RN unable to leave message. SChaplin, RN,BSN

## 2020-03-27 NOTE — Telephone Encounter (Signed)
Please call pt back regarding pain meds.  

## 2020-03-27 NOTE — Patient Instructions (Signed)
Visit Information It was nice speaking with you today. Goals Addressed              This Visit's Progress     Patient Stated     "I'm doing about the same; I still get short of breath very easily." (pt-stated)        CARE PLAN ENTRY (see longitudinal plan of care for additional care plan information)  Current Barriers:   Chronic Disease Management support and education needs related to HTN, asthma with post Covid dyspnea. - patient states she is still feeling short of breath with minimal exertion. She says she sees Dr. Judeth Horn on a regular basis for treatment of her asthma and post Covid dyspnea. She says her migraines have not been well controlled despite the Ajovy, she says she pick up her last dose today because she no longer qualifies for the Ajovy patient assistance program because her health insurance has been reinstated  Nurse Case Manager Clinical Goal(s):   Over the next 30-60 days, the patient will demonstrate ongoing self health care management ability as evidenced by contacting the appropriate providers for new concerns or problems related  to HTN, migraines and dyspnea from asthma or post Covid pulmonary comorbidity  Interventions:   Prior to interviewing patient, reviewed patient status, including review of recent office visit notes, consultants reports, relevant laboratory and other test results, and medications, completed.  Evaluation of current treatment plan related to HTN, asthma., post Covid pulmonary comorbidity and migraines and patient's adherence to plan as established by provider.  Reviewed medications with patient and discussed medication taking behavior  Reviewed treatment and action plan for asthma and post Covid dyspnea per Dr Judeth Horn ( pulmonologist)  Advised patient that clinic has hired a Teacher, early years/pre and behavioral health counselor should she request or need those services in the future  Discussed plans with patient for ongoing care management  follow up and ensured patient has contact number for CCM team and clinic  Inter-disciplinary care team collaboration (see longitudinal plan of care)- advised patient to contact this CCM RN or the clinic if she helps in getting ongoing approval for Ajovy  Patient Self Care Activities:   Self administers medications as prescribed  Attends all scheduled provider appointments  Calls pharmacy for medication refills  Performs ADL's independently  Performs IADL's independently  Calls provider office for new concerns or questions    Please see past updates related to this goal by clicking on the "Past Updates" button in the selected goal         The patient verbalized understanding of instructions provided today and declined a print copy of patient instruction materials.   The care management team will reach out to the patient again over the next 30-60 days.   Cranford Mon RN, CCM, [facility] CCM Clinic RN Care Manager [phone]

## 2020-03-28 ENCOUNTER — Telehealth: Payer: Self-pay | Admitting: Internal Medicine

## 2020-03-28 ENCOUNTER — Other Ambulatory Visit: Payer: Self-pay | Admitting: Internal Medicine

## 2020-03-28 DIAGNOSIS — L309 Dermatitis, unspecified: Secondary | ICD-10-CM

## 2020-03-28 MED ORDER — TRIAMCINOLONE ACETONIDE 0.1 % EX OINT: 1.0000 "application " | TOPICAL_OINTMENT | Freq: Two times a day (BID) | CUTANEOUS | 0 refills | Status: DC

## 2020-03-28 MED FILL — TRIAMCINOLONE 0.1% OINTMEN: 0.1 | 15 days supply | Qty: 30 | Fill #0

## 2020-03-28 NOTE — Progress Notes (Signed)
Internal Medicine Clinic Attending  I saw and evaluated the patient.  I personally confirmed the key portions of the history and exam documented by Dr. Lee and I reviewed pertinent patient test results.  The assessment, diagnosis, and plan were formulated together and I agree with the documentation in the resident's note.  

## 2020-03-28 NOTE — Telephone Encounter (Signed)
Per pt Dr Nedra Hai had started her a some new medicine, but the medicine is still not at the pharmacy; pls contact pt  [phone]

## 2020-03-28 NOTE — Telephone Encounter (Signed)
That was my mistake. The appropriate med have been sent to pharmacy.

## 2020-03-28 NOTE — Progress Notes (Signed)
Internal Medicine Clinic Attending  CCM services provided by the care management provider and their documentation were discussed with Dr. Aslam. We reviewed the pertinent findings, urgent action items addressed by the resident and non-urgent items to be addressed by the PCP.  I agree with the assessment, diagnosis, and plan of care documented in the CCM and resident's note.  Solmon Bohr Thomas Camelle Henkels, MD 03/28/2020  

## 2020-03-29 ENCOUNTER — Ambulatory Visit: Payer: BC Managed Care – PPO

## 2020-03-29 NOTE — Assessment & Plan Note (Signed)
Mammogram result update: BI-RADS 2 category 2. Slight skin thickening over the affected area but no evidence of malignancy. 1Y follow up discussed with pt by radiology.  Plan: will continue to need close monitoring of the lesion. Although unlikely, I would still consider peau d'orange if it continues to grow or develops worsening features.

## 2020-04-02 ENCOUNTER — Telehealth: Payer: Self-pay | Admitting: Internal Medicine

## 2020-04-02 DIAGNOSIS — L309 Dermatitis, unspecified: Secondary | ICD-10-CM

## 2020-04-02 NOTE — Telephone Encounter (Signed)
Returned call to patient. She notes that the cream prescribed is not working and feels like it is drying out her rash more which is more painful and itchy. Discussed recommendation for referral to Dermatology for possible biopsy. She expresses understanding and is agreeable with the plan. Referral to dermatology placed.

## 2020-04-02 NOTE — Telephone Encounter (Signed)
Pls contact pt regarding medicine(cream) it isn't working [phone]

## 2020-04-02 NOTE — Telephone Encounter (Signed)
Per OV note on 03/27/2020, if no improvement patient to be referred to [facility] for bx. Please advise. Kinnie Feil, BSN, RN-BC

## 2020-04-03 ENCOUNTER — Telehealth: Payer: Self-pay | Admitting: Internal Medicine

## 2020-04-03 ENCOUNTER — Other Ambulatory Visit: Payer: Self-pay | Admitting: Internal Medicine

## 2020-04-03 ENCOUNTER — Other Ambulatory Visit: Payer: BC Managed Care – PPO

## 2020-04-03 DIAGNOSIS — L309 Dermatitis, unspecified: Secondary | ICD-10-CM

## 2020-04-03 MED ORDER — MELOXICAM 7.5 MG PO TABS: 15.0000 mg | ORAL_TABLET | Freq: Every day | ORAL | 0 refills | Status: DC

## 2020-04-03 MED FILL — MELOXICAM 7.5 MG TABLET: 7.5 | 14 days supply | Qty: 28 | Fill #0

## 2020-04-03 NOTE — Telephone Encounter (Signed)
Please see note below.  Patient was seen in clinic (Dr. Nedra Hai) for this on 10/5.  Forwarding to yellow team to advise. Thank you, SChaplin, RN,BSN

## 2020-04-03 NOTE — Telephone Encounter (Signed)
F/u with a referral Request.   Referral has been sent to Dr. Nita Sells in [place].  Spoke with the office this morning and the patient has been sch for 04/13/2020 @ 11:30am.  Pt has been notified of this appt. The pt states she has now called out of work because of the pain in nipple areas.  Pt is requesting something for the pain.

## 2020-04-03 NOTE — Telephone Encounter (Signed)
I'll give her a call thank you 

## 2020-04-03 NOTE — Telephone Encounter (Signed)
Spoke with Stacey Price regarding her breast pain. Mentions continuing breast pain and lack of improvement with triamcinolone. Denies any fevers, chills, nipple discharge. Discussed trial of anti-inflammatory meds to assess for relief. Stacey Price expressed understanding and agrees with the plan. She has follow up scheduled with dermatology next week.

## 2020-04-03 NOTE — Addendum Note (Signed)
Addended by: Theotis Barrio on: 04/03/2020 02:58 PM   Modules accepted: Orders

## 2020-04-05 ENCOUNTER — Telehealth: Payer: Self-pay | Admitting: Internal Medicine

## 2020-04-05 ENCOUNTER — Encounter: Payer: Self-pay | Admitting: Internal Medicine

## 2020-04-05 NOTE — Telephone Encounter (Signed)
Sent a Clinical cytogeneticist message for more details.  Thanks!

## 2020-04-05 NOTE — Telephone Encounter (Signed)
Pt is wanting a letter to be out of work/return to work pls contact [phone]

## 2020-04-05 NOTE — Telephone Encounter (Signed)
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 SChaplin, RN,BSN

## 2020-04-09 NOTE — Telephone Encounter (Signed)
Pt is calling back regarding Dr Criselda Peaches my chart message, pt said she replied back.  Pt wants a note to be excuse from work due to breast pain, pls contact pt [phone]

## 2020-04-10 NOTE — Telephone Encounter (Signed)
Pt is calling to she replied to Dr Criselda Peaches my chart message

## 2020-04-10 NOTE — Telephone Encounter (Signed)
Dr. Nedra Hai contacted the patient and wrote a note.

## 2020-04-10 NOTE — Telephone Encounter (Signed)
Patient's mychart message forwarded to Dr. Criselda Peaches this morning. SChaplin, RN,BSN

## 2020-04-11 ENCOUNTER — Telehealth: Payer: Self-pay

## 2020-04-11 NOTE — Telephone Encounter (Signed)
Pt is calling back regarding the letter; pls contact [phone]

## 2020-04-11 NOTE — Telephone Encounter (Signed)
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

## 2020-04-11 NOTE — Telephone Encounter (Signed)
Discussed with Stacey Price regarding her letter for work note. She mentions that her workplace has requested further details in the letter. Advised to [name] that her health information is protected and any further medical details in her letter cannot be released unless she provides consent for release of information. Discussed providing new letter with further details regarding timing of her office visits. [name] expressed understanding.

## 2020-04-11 NOTE — Telephone Encounter (Addendum)
She should have received a letter from me via MyChart. I will call her to clarify.  Attempted to speak with Stacey Price regarding her work note. Patient did not pick up. Will try again at a later time.

## 2020-04-11 NOTE — Telephone Encounter (Signed)
Pt missed a call, pls call back [phone]

## 2020-04-12 ENCOUNTER — Encounter: Payer: Self-pay | Admitting: Internal Medicine

## 2020-04-13 DIAGNOSIS — L308 Other specified dermatitis: Secondary | ICD-10-CM | POA: Diagnosis not present

## 2020-04-18 ENCOUNTER — Telehealth: Payer: Self-pay | Admitting: Pulmonary Disease

## 2020-04-18 MED FILL — AJOVY 225 MG/1.5ML SOSY: 225 | 30 days supply | Qty: 2 | Fill #1

## 2020-04-18 NOTE — Telephone Encounter (Signed)
LMTCB x1 for pt.  

## 2020-04-18 NOTE — Telephone Encounter (Signed)
Pt returning phone call. Pt can be reached at [phone].

## 2020-04-18 NOTE — Telephone Encounter (Signed)
Spoke with pt. 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 Pt is concerned about this and would like Dr. Laurena Spies opinion. 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  Dr. Judeth Horn - please advise. Thanks!

## 2020-04-20 ENCOUNTER — Telehealth: Payer: Self-pay

## 2020-04-20 NOTE — Telephone Encounter (Signed)
Informed pt FMLA form has been completed, pt states she will come pick up the form.

## 2020-05-02 NOTE — Telephone Encounter (Signed)
Dr. Hunsucker, please advise. Thanks 

## 2020-05-03 NOTE — Telephone Encounter (Signed)
Patient is aware of below message and voiced her understanding.  Nothing further needed.   

## 2020-05-03 NOTE — Telephone Encounter (Signed)
Spoke to patient and relayed Dr. Laurena Spies response.  She is concerned about being exposed to covid.  Previously to working from home, her workplace had two positive cases and the employees were not made aware of this.  She is concerned about this matter with her previous hx of covid.  Dr. Judeth Horn, please advise. thanks

## 2020-05-04 DIAGNOSIS — Z20822 Contact with and (suspected) exposure to covid-19: Secondary | ICD-10-CM | POA: Diagnosis not present

## 2020-05-08 ENCOUNTER — Ambulatory Visit: Payer: BC Managed Care – PPO | Admitting: Pulmonary Disease

## 2020-05-08 ENCOUNTER — Telehealth: Payer: BC Managed Care – PPO

## 2020-05-10 DIAGNOSIS — Z20822 Contact with and (suspected) exposure to covid-19: Secondary | ICD-10-CM | POA: Diagnosis not present

## 2020-05-16 MED FILL — AJOVY 225 MG/1.5ML SOSY: 225 | 30 days supply | Qty: 2 | Fill #1

## 2020-05-23 ENCOUNTER — Telehealth: Payer: Self-pay | Admitting: Internal Medicine

## 2020-05-23 ENCOUNTER — Ambulatory Visit: Payer: BC Managed Care – PPO | Admitting: *Deleted

## 2020-05-23 DIAGNOSIS — I10 Essential (primary) hypertension: Secondary | ICD-10-CM

## 2020-05-23 DIAGNOSIS — J4520 Mild intermittent asthma, uncomplicated: Secondary | ICD-10-CM

## 2020-05-23 DIAGNOSIS — U071 COVID-19: Secondary | ICD-10-CM

## 2020-05-23 DIAGNOSIS — J984 Other disorders of lung: Secondary | ICD-10-CM

## 2020-05-23 NOTE — Progress Notes (Signed)
Internal Medicine Clinic Resident  I have personally reviewed this encounter including the documentation in this note and/or discussed this patient with the care management provider. I will address any urgent items identified by the care management provider and will communicate my actions to the patient's PCP. I have reviewed the patient's CCM visit with my supervising attending, Dr Narendra.  Retaj Hilbun, DO 05/23/2020   

## 2020-05-23 NOTE — Chronic Care Management (AMB) (Signed)
  Care Management   Follow Up Note   05/23/2020 Name: Courteny Egler MRN: [id number] DOB: [birth year]  Arriana Lohmann is enrolled in a Managed Medicaid plan: No. Outreach attempt today was successful.    Referred by: [name], MD Reason for referral : Care Coordination (HTN, Asthma, migraines, obesity, post Covid dyspnea )   Etta Gassett is a 44 y.o. year old female who is a primary care patient of [name], MD. The care management team was consulted for assistance with care management and care coordination needs.    Review of patient status, including review of consultants reports, relevant laboratory and other test results, and collaboration with appropriate care team members and the patient's provider was performed as part of comprehensive patient evaluation and provision of chronic care management services.    Goals Addressed              This Visit's Progress     Patient Stated   .  COMPLETED: "I'm doing about the same; I still get short of breath very easily." (pt-stated)        CARE PLAN ENTRY (see longitudinal plan of care for additional care plan information)  Current Barriers:  . 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 - patient states she was recently exposed to Covid but has tested negative x 2. She is asking about recommendations for further Covid vaccination. She states she is still following up with a pulmonologist and the [facility]. No chronic disease management  needs identified and patient agrees to notify clinic providers if she feels she would benefit from CCM services.   Nurse Case Manager Clinical Goal(s):  [name] Over the next 30-60 days, the patient will demonstrate ongoing self health care management ability as evidenced by contacting the appropriate providers for new concerns or problems related  to HTN, migraines and dyspnea from asthma or post Covid pulmonary  comorbidity  Interventions:  . Prior to interviewing patient, reviewed patient status, including review of recent office visit notes, consultants reports, relevant laboratory and other test results, and medications, completed. . Evaluation of current treatment plan related to HTN, asthma., post Covid pulmonary comorbidity and migraines and patient's adherence to plan as established by provider. . Reviewed medications with patient and discussed medication taking behavior . Reviewed treatment and action plan for asthma and post Covid dyspnea per Dr Silas Flood ( pulmonologist) . Suggested she contact Dr Silas Flood or Covid clinic staff to discuss future Covid vaccination recommendations . Advised patient that clinic has hired a Software engineer and behavioral health counselor should she request or need those services in the future . Advised patient to const clinic if she feels she would benefit from CCM services in the future . Closing referral at this time with patient's agreement as all CCM goals met and no further needs identified  Patient Self Care Activities:  . Self administers medications as prescribed . Attends all scheduled provider appointments . Calls pharmacy for medication refills . Performs ADL's independently . Performs IADL's independently . Calls provider office for new concerns or questions .   Please see past updates related to this goal by clicking on the "Past Updates" button in the selected goal          No further follow up required: all CCM goals met.   [name] RN, CCM, [facility] RN Care Manager [phone]

## 2020-05-23 NOTE — Patient Instructions (Signed)
Visit Information It was nice speaking with you today. Please contact the clinic if you feel you would benefits from CCM services in the future.  Goals Addressed              This Visit's Progress     Patient Stated   .  COMPLETED: "I'm doing about the same; I still get short of breath very easily." (pt-stated)        CARE PLAN ENTRY (see longitudinal plan of care for additional care plan information)  Current Barriers:  . 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 - patient states she was recently exposed to Covid but has tested negative x 2. She is asking about recommendations for further Covid vaccination. She states she is still following up with a pulmonologist and the [facility]. No chronic disease management  needs identified and patient agrees to notify clinic providers if she feels she would benefit from CCM services.   Nurse Case Manager Clinical Goal(s):  [name] Over the next 30-60 days, the patient will demonstrate ongoing self health care management ability as evidenced by contacting the appropriate providers for new concerns or problems related  to HTN, migraines and dyspnea from asthma or post Covid pulmonary comorbidity  Interventions:  . Prior to interviewing patient, reviewed patient status, including review of recent office visit notes, consultants reports, relevant laboratory and other test results, and medications, completed. . Evaluation of current treatment plan related to HTN, asthma., post Covid pulmonary comorbidity and migraines and patient's adherence to plan as established by provider. . Reviewed medications with patient and discussed medication taking behavior . Reviewed treatment and action plan for asthma and post Covid dyspnea per Dr Silas Flood ( pulmonologist) . Suggested she contact Dr Silas Flood or Covid clinic staff to discuss future Covid vaccination recommendations . Advised patient that clinic  has hired a Software engineer and behavioral health counselor should she request or need those services in the future . Advised patient to const clinic if she feels she would benefit from CCM services in the future . Closing referral at this time with patient's agreement as all CCM goals met and no further needs identified  Patient Self Care Activities:  . Self administers medications as prescribed . Attends all scheduled provider appointments . Calls pharmacy for medication refills . Performs ADL's independently . Performs IADL's independently . Calls provider office for new concerns or questions .   Please see past updates related to this goal by clicking on the "Past Updates" button in the selected goal         The patient verbalized understanding of instructions, educational materials, and care plan provided today and declined offer to receive copy of patient instructions, educational materials, and care plan.   No further follow up required: all CCM goals met.  [name] RN, CCM, [facility] RN Care Manager [phone]

## 2020-05-23 NOTE — Telephone Encounter (Signed)
Pt wants to know if you have any information about the covid vaxx. Specifically when she should receive it

## 2020-05-28 NOTE — Progress Notes (Signed)
Internal Medicine Clinic Attending  CCM services provided by the care management provider and their documentation were discussed with Dr. Nguyen. We reviewed the pertinent findings, urgent action items addressed by the resident and non-urgent items to be addressed by the PCP.  I agree with the assessment, diagnosis, and plan of care documented in the CCM and resident's note.  Teal Raben, MD 05/28/2020  

## 2020-05-29 ENCOUNTER — Telehealth: Payer: Self-pay | Admitting: Neurology

## 2020-05-29 NOTE — Telephone Encounter (Signed)
I spoke to the patient. She is aware that she may proceed with the COVID-19 vaccination while on Ajovy.

## 2020-05-29 NOTE — Telephone Encounter (Signed)
Pt would like to know if while being on AJOVY 225 MG/1.5ML SOSY how will it affect her getting the  Covid-19 vaccine, please call

## 2020-05-30 ENCOUNTER — Other Ambulatory Visit: Payer: Self-pay

## 2020-05-30 MED ORDER — DICYCLOMINE HCL 20 MG PO TABS: 20.0000 mg | ORAL_TABLET | Freq: Two times a day (BID) | ORAL | 0 refills | Status: DC | PRN

## 2020-05-30 NOTE — Telephone Encounter (Signed)
dicyclomine (BENTYL) 20 MG tablet, refill request @  [facility] [place], [place] - [address] [facility]. Phone:  [phone]  Fax:  [phone]

## 2020-07-09 ENCOUNTER — Telehealth: Payer: Self-pay | Admitting: Neurology

## 2020-08-10 ENCOUNTER — Other Ambulatory Visit: Payer: Self-pay | Admitting: Neurology

## 2020-08-24 ENCOUNTER — Other Ambulatory Visit: Payer: Self-pay | Admitting: Internal Medicine

## 2020-08-24 ENCOUNTER — Other Ambulatory Visit: Payer: Self-pay

## 2020-08-24 DIAGNOSIS — F419 Anxiety disorder, unspecified: Secondary | ICD-10-CM

## 2020-08-24 DIAGNOSIS — J4520 Mild intermittent asthma, uncomplicated: Secondary | ICD-10-CM

## 2020-08-24 DIAGNOSIS — J984 Other disorders of lung: Secondary | ICD-10-CM

## 2020-08-24 DIAGNOSIS — U071 COVID-19: Secondary | ICD-10-CM

## 2020-08-24 DIAGNOSIS — F331 Major depressive disorder, recurrent, moderate: Secondary | ICD-10-CM

## 2020-08-24 MED ORDER — MONTELUKAST SODIUM 10 MG PO TABS: 10.0000 mg | ORAL_TABLET | Freq: Every day | ORAL | 3 refills | Status: DC

## 2020-08-24 MED ORDER — OMEPRAZOLE 20 MG PO CPDR: 20.0000 mg | DELAYED_RELEASE_CAPSULE | Freq: Every day | ORAL | 3 refills | Status: DC

## 2020-08-24 MED ORDER — DULOXETINE HCL 60 MG PO CPEP: 60.0000 mg | ORAL_CAPSULE | Freq: Every day | ORAL | 3 refills | Status: DC

## 2020-08-24 MED ORDER — DICYCLOMINE HCL 20 MG PO TABS: 20.0000 mg | ORAL_TABLET | Freq: Two times a day (BID) | ORAL | 0 refills | Status: DC | PRN

## 2020-08-24 MED ORDER — CETIRIZINE HCL 10 MG PO TABS: 10.0000 mg | ORAL_TABLET | Freq: Every day | ORAL | 11 refills | Status: DC

## 2020-08-24 MED FILL — MONTELUKAST SOD 10 MG TAB: 10 | 30 days supply | Qty: 30 | Fill #0

## 2020-08-24 MED FILL — OMEPRAZOLE DR 20 MG CAPSULE: 20 | 30 days supply | Qty: 30 | Fill #0

## 2020-08-24 MED FILL — DULoxetine HCL 60 MG CPEP: 60 | 30 days supply | Qty: 30 | Fill #0

## 2020-08-24 NOTE — Telephone Encounter (Signed)
Returned call to patient. She is requesting all meds be refilled at [facility]. Other pharmacies removed from list. She will contact Dr. Terrace Arabia for refill on Ajovy and  Dr. Judeth Horn for refill on [place]. Scheduled first available appt with PCP for 11/16/2020.

## 2020-08-24 NOTE — Telephone Encounter (Signed)
Requesting to speak with a nurse about meds, please call pt back.  

## 2020-08-28 ENCOUNTER — Ambulatory Visit: Payer: BC Managed Care – PPO | Admitting: Internal Medicine

## 2020-08-28 ENCOUNTER — Other Ambulatory Visit: Payer: Self-pay

## 2020-08-28 ENCOUNTER — Telehealth: Payer: Self-pay | Admitting: Internal Medicine

## 2020-08-28 ENCOUNTER — Other Ambulatory Visit (HOSPITAL_COMMUNITY): Payer: Self-pay

## 2020-08-28 DIAGNOSIS — F331 Major depressive disorder, recurrent, moderate: Secondary | ICD-10-CM | POA: Diagnosis not present

## 2020-08-28 DIAGNOSIS — F411 Generalized anxiety disorder: Secondary | ICD-10-CM | POA: Diagnosis not present

## 2020-08-28 MED FILL — CITALOPRAM HBR 20 MG TABLET: 20 | 30 days supply | Qty: 30 | Fill #0

## 2020-08-28 MED FILL — LORazepam 0.5 MG TABS: 0.5 | 30 days supply | Qty: 10 | Fill #0

## 2020-08-28 MED FILL — busPIRone HCL 15 MG TABS: 15 | 30 days supply | Qty: 90 | Fill #0

## 2020-08-28 NOTE — Telephone Encounter (Signed)
Received a message to return a call to the patient who was requesting a note for work for a LOA for 30d. I returned the call to the patient. She was tearful but revealed that her mother in law had passed away about 5 minutes prior to me calling. She asked if our office could write a note for her work for the 30d as she has had 8 family members pass away recently.  Chart reviewed. She follows with Vesta Mixer and Dr. Sallyanne Havers who is her psychiatrist. She notes that she just saw him yesterday. I asked her to call his office since he has been following her for her mental health issues. She was agreeable to doing this.  I did inquire into her current mental state--she states she has no thoughts of self harm or suicide. She implied understanding to seek emergency medical care if she develops these thoughts.   Elige Radon, MD Internal Medicine Resident PGY-2 [facility] Pager: [phone] 08/28/2020 3:20 PM

## 2020-08-29 ENCOUNTER — Other Ambulatory Visit: Payer: Self-pay

## 2020-08-29 NOTE — Telephone Encounter (Signed)
AJOVY 225 MG/1.5ML SOSY, refill request @  [facility] [place], [place] - [address] [facility]. Phone:  [phone]  Fax:  [phone]

## 2020-08-29 NOTE — Telephone Encounter (Signed)
RTC, RN asked patient if she was still taking this medication regularly, last refill is noted as of 07/01/19 and was sent by Dr. Terrace Arabia.  Pt states "Dr. Criselda Peaches has sent this in for me before.  I didn't know if she would be willing to do this again, I can come in for an appt if need be.  I lost my insurance for a while and was off of the medication".  Pt states her last injection was around Aug/Sept 2021 and reports it helps her migraines.  Will forward to PCP to advise. Thank you, SChaplin, RN,BSN

## 2020-08-30 ENCOUNTER — Other Ambulatory Visit: Payer: Self-pay | Admitting: Internal Medicine

## 2020-08-30 ENCOUNTER — Telehealth: Payer: Self-pay | Admitting: *Deleted

## 2020-08-30 DIAGNOSIS — R002 Palpitations: Secondary | ICD-10-CM

## 2020-08-30 DIAGNOSIS — G43709 Chronic migraine without aura, not intractable, without status migrainosus: Secondary | ICD-10-CM

## 2020-08-30 MED ORDER — METOPROLOL SUCCINATE ER 25 MG PO TB24: 12.5000 mg | ORAL_TABLET | Freq: Every day | ORAL | 2 refills | Status: DC

## 2020-08-30 MED ORDER — AJOVY 225 MG/1.5ML ~~LOC~~ SOSY: PREFILLED_SYRINGE | SUBCUTANEOUS | 4 refills | Status: DC

## 2020-08-30 MED ORDER — TOPIRAMATE 50 MG PO TABS: 100.0000 mg | ORAL_TABLET | Freq: Two times a day (BID) | ORAL | 3 refills | Status: DC

## 2020-08-30 MED FILL — TOPIRAMATE 50 MG TABLET: 50 | 30 days supply | Qty: 120 | Fill #0

## 2020-08-30 MED FILL — METOPROLOL SUCCINATE ER 25: 25 | 30 days supply | Qty: 15 | Fill #0

## 2020-08-30 MED FILL — AJOVY 225 MG/1.5ML SOSY: 225 | 30 days supply | Qty: 2 | Fill #0

## 2020-08-30 NOTE — Telephone Encounter (Signed)
Please send refills for metoprolol and topiramate. Epic will not set these up as they need alternate dx codes. Thank you.

## 2020-08-30 NOTE — Telephone Encounter (Signed)
Done. Thank you.

## 2020-09-03 ENCOUNTER — Telehealth: Payer: Self-pay | Admitting: *Deleted

## 2020-09-03 NOTE — Telephone Encounter (Addendum)
Information was sent to CoverMyMeds for PA for Ajovy.   Awaiting determination.  Angelina Ok, RN 09/03/2020 12:19 PM.  PA for Ajovy approved 09-03-2020 thru 09/03/2021 as long as patient continues her current insurance plan.  Angelina Ok, RN 09/03/2020 3:40 PM.

## 2020-09-08 DIAGNOSIS — F331 Major depressive disorder, recurrent, moderate: Secondary | ICD-10-CM | POA: Diagnosis not present

## 2020-09-08 DIAGNOSIS — F411 Generalized anxiety disorder: Secondary | ICD-10-CM | POA: Diagnosis not present

## 2020-09-08 DIAGNOSIS — F4310 Post-traumatic stress disorder, unspecified: Secondary | ICD-10-CM | POA: Diagnosis not present

## 2020-09-13 DIAGNOSIS — F331 Major depressive disorder, recurrent, moderate: Secondary | ICD-10-CM | POA: Diagnosis not present

## 2020-09-13 DIAGNOSIS — F411 Generalized anxiety disorder: Secondary | ICD-10-CM | POA: Diagnosis not present

## 2020-09-13 DIAGNOSIS — F4310 Post-traumatic stress disorder, unspecified: Secondary | ICD-10-CM | POA: Diagnosis not present

## 2020-09-25 ENCOUNTER — Other Ambulatory Visit (HOSPITAL_COMMUNITY): Payer: Self-pay

## 2020-09-25 DIAGNOSIS — F4310 Post-traumatic stress disorder, unspecified: Secondary | ICD-10-CM | POA: Diagnosis not present

## 2020-09-25 DIAGNOSIS — F411 Generalized anxiety disorder: Secondary | ICD-10-CM | POA: Diagnosis not present

## 2020-09-25 DIAGNOSIS — F331 Major depressive disorder, recurrent, moderate: Secondary | ICD-10-CM | POA: Diagnosis not present

## 2020-09-25 MED ORDER — PRAZOSIN HCL 1 MG PO CAPS
1.0000 mg | ORAL_CAPSULE | Freq: Every evening | ORAL | 0 refills | Status: DC
  Filled 2020-09-25: qty 30, 30d supply, fill #0

## 2020-09-25 MED ORDER — LORAZEPAM 0.5 MG PO TABS
0.5000 mg | ORAL_TABLET | Freq: Every day | ORAL | 0 refills | Status: DC | PRN
  Filled 2020-09-25: qty 20, 30d supply, fill #0

## 2020-09-25 MED ORDER — CITALOPRAM HYDROBROMIDE 20 MG PO TABS
20.0000 mg | ORAL_TABLET | Freq: Every day | ORAL | 0 refills | Status: DC
  Filled 2020-09-25: qty 30, 30d supply, fill #0

## 2020-09-25 MED ORDER — BUSPIRONE HCL 15 MG PO TABS
15.0000 mg | ORAL_TABLET | Freq: Three times a day (TID) | ORAL | 0 refills | Status: DC
  Filled 2020-09-25: qty 90, 30d supply, fill #0

## 2020-09-26 ENCOUNTER — Other Ambulatory Visit (HOSPITAL_COMMUNITY): Payer: Self-pay

## 2020-10-03 ENCOUNTER — Other Ambulatory Visit (HOSPITAL_COMMUNITY): Payer: Self-pay

## 2020-10-03 DIAGNOSIS — F411 Generalized anxiety disorder: Secondary | ICD-10-CM | POA: Diagnosis not present

## 2020-10-03 DIAGNOSIS — F331 Major depressive disorder, recurrent, moderate: Secondary | ICD-10-CM | POA: Diagnosis not present

## 2020-10-03 DIAGNOSIS — F4310 Post-traumatic stress disorder, unspecified: Secondary | ICD-10-CM | POA: Diagnosis not present

## 2020-10-11 ENCOUNTER — Other Ambulatory Visit (HOSPITAL_COMMUNITY): Payer: Self-pay

## 2020-10-11 MED FILL — Fremanezumab-vfrm Subcutaneous Soln Pref Syr 225 MG/1.5ML: SUBCUTANEOUS | 30 days supply | Qty: 1.5 | Fill #0 | Status: AC

## 2020-10-14 ENCOUNTER — Encounter (HOSPITAL_BASED_OUTPATIENT_CLINIC_OR_DEPARTMENT_OTHER): Payer: Self-pay | Admitting: *Deleted

## 2020-10-14 ENCOUNTER — Emergency Department (HOSPITAL_BASED_OUTPATIENT_CLINIC_OR_DEPARTMENT_OTHER)
Admission: EM | Admit: 2020-10-14 | Discharge: 2020-10-14 | Disposition: A | Discharge status: Home / Self Care | Payer: BC Managed Care – PPO | Attending: Emergency Medicine | Admitting: Emergency Medicine

## 2020-10-14 ENCOUNTER — Other Ambulatory Visit: Payer: Self-pay

## 2020-10-14 DIAGNOSIS — Z79899 Other long term (current) drug therapy: Secondary | ICD-10-CM | POA: Diagnosis not present

## 2020-10-14 DIAGNOSIS — Z87891 Personal history of nicotine dependence: Secondary | ICD-10-CM | POA: Insufficient documentation

## 2020-10-14 DIAGNOSIS — Z8616 Personal history of COVID-19: Secondary | ICD-10-CM | POA: Diagnosis not present

## 2020-10-14 DIAGNOSIS — J45909 Unspecified asthma, uncomplicated: Secondary | ICD-10-CM | POA: Diagnosis not present

## 2020-10-14 DIAGNOSIS — J028 Acute pharyngitis due to other specified organisms: Secondary | ICD-10-CM | POA: Diagnosis not present

## 2020-10-14 DIAGNOSIS — I10 Essential (primary) hypertension: Secondary | ICD-10-CM | POA: Diagnosis not present

## 2020-10-14 DIAGNOSIS — J029 Acute pharyngitis, unspecified: Secondary | ICD-10-CM | POA: Diagnosis not present

## 2020-10-14 DIAGNOSIS — B9789 Other viral agents as the cause of diseases classified elsewhere: Secondary | ICD-10-CM | POA: Diagnosis not present

## 2020-10-14 LAB — GROUP A STREP BY PCR: Group A Strep by PCR: NOT DETECTED

## 2020-10-14 MED ORDER — PREDNISONE 10 MG PO TABS: 40.0000 mg | ORAL_TABLET | Freq: Every day | ORAL | 0 refills | Status: AC

## 2020-10-14 MED ORDER — ACETAMINOPHEN 325 MG PO TABS
650.0000 mg | ORAL_TABLET | Freq: Once | ORAL | Status: AC
  Administered 2020-10-14: 650 mg via ORAL
  Filled 2020-10-14: qty 2

## 2020-10-14 MED ORDER — LIDOCAINE VISCOUS HCL 2 % MT SOLN
15.0000 mL | Freq: Once | OROMUCOSAL | Status: AC
  Administered 2020-10-14: 15 mL via OROMUCOSAL
  Filled 2020-10-14: qty 15

## 2020-10-14 MED ORDER — IBUPROFEN 800 MG PO TABS
800.0000 mg | ORAL_TABLET | Freq: Once | ORAL | Status: AC
  Administered 2020-10-14: 800 mg via ORAL
  Filled 2020-10-14: qty 1

## 2020-10-14 NOTE — ED Triage Notes (Signed)
Pt. Reports she has had a sore throat for 3 days now and it is bothering her asthma to the point of having to use her inhaler.  She uses [employer] fast streamer.  Pt in no resp. Distress during triage assessment.  Pt. Reports soreness with eating and drinking and swallowing.  No noted drooling.

## 2020-10-14 NOTE — ED Notes (Signed)
Pt. To swish and gargle with lidocaine

## 2020-10-14 NOTE — Discharge Instructions (Addendum)
Like we discussed, your strep test today was negative.  You likely have a viral pharyngitis.  This is a sore throat due to a viral illness.  I will prescribe you prednisone you can take in the morning for the next 5 days.  This will help with pain and inflammation.  Please additionally take Tylenol and ibuprofen for your pain.  You can rotate these 2 medications.  Please follow the instructions on the bottle.  Please continue to monitor your symptoms closely.  If you develop worsening fevers, chills, difficulty swallowing, drooling, shortness of breath, please come back to the emergency department immediately for reevaluation.  It was a pleasure to meet you.

## 2020-10-14 NOTE — ED Provider Notes (Signed)
[facility] EMERGENCY DEPARTMENT Provider Note   CSN: [id number] Arrival date & time: 10/14/20  1333     History Chief Complaint  Patient presents with  . Sore Throat    Stacey Price is a 45 y.o. female.  HPI Patient is a 45 year old female who presents the emergency department due to sore throat for the past 3 days.  Also reports associated cough, congestion, worsening asthma symptoms.  Stacey Price states Stacey Price has been wheezing more frequently since her symptoms started.  States her pain worsens when swallowing.  Denies any fevers, chest pain, shortness of breath, drooling, difficulty swallowing, nausea, vomiting.  Stacey Price has been vaccinated for COVID x2 and is also had COVID-19, 2 times.    Past Medical History:  Diagnosis Date  . Asthma   . Hypertension   . Migraines   . Obesity   . Panic attacks   . Peritonitis (HCC) 2014  . Pneumonia     Patient Active Problem List   Diagnosis Date Noted  . Shortness of breath 01/04/2020  . History of COVID-19 01/04/2020  . 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 Panic attacks 03/02/2019  . Moderate episode of recurrent major depressive disorder (HCC) 11/24/2018  . Chronic migraine 03/03/2018  . Chronic daily headache 03/03/2018  . History of uterine cancer 12/11/2017  . History of treatment for tuberculosis 12/11/2017  . 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 Obesity, Class III, BMI 40-49.9 (morbid obesity) (HCC) 01/20/2017  . Nipple dermatitis 04/15/2016  . HTN (hypertension) 10/12/2015  . Asthma 10/12/2015    Past Surgical History:  Procedure Laterality Date  . ABDOMINAL HYSTERECTOMY  2009   [facility]   . CESAREAN SECTION     1995-2000     OB History   No obstetric history on file.     Family History  Problem Relation Age of Onset  . Hypertension Mother   . Hypertension Father   . Breast cancer Cousin   . Ovarian cancer Paternal Grandmother     Social History   Tobacco Use  . Smoking status: Former Smoker     Packs/day: 1.00    Years: 10.00    Pack years: 10.00    Types: Cigarettes  . Smokeless tobacco: Never Used  . Tobacco comment: has quit smoking as of 5 years ago  Vaping Use  . Vaping Use: Former  . Devices: just tried when first came out not a regular user  Substance Use Topics  . Alcohol use: Yes    Comment: Occasionally.  . Drug use: No    Home Medications Prior to Admission medications   Medication Sig Start Date End Date Taking? Authorizing Provider  Budeson-Glycopyrrol-Formoterol (BREZTRI AEROSPHERE) 160-9-4.8 MCG/ACT AERO Inhale 2 puffs into the lungs in the morning and at bedtime. 01/19/20  Yes Hunsucker, Lesia Sago, MD  busPIRone (BUSPAR) 15 MG tablet TAKE 1 TAB BY MOUTH ON DAY 1, THEN TAKE 1 TABLET BY MOUTH TWICE DAILY FOR 1 DAY, THEN TAKE 1 TABLET BY MOUTH 3 TIMES DAILY. (TAKE WITH FOOD) 08/28/20 08/28/21 Yes   busPIRone (BUSPAR) 15 MG tablet Take 1 tablet (15 mg total) by mouth 3 (three) times daily with food. 09/25/20  Yes   cetirizine (ZYRTEC) 10 MG tablet TAKE 1 TABLET (10 MG TOTAL) BY MOUTH DAILY. 08/24/20 08/24/21 Yes Inez Catalina, MD  citalopram (CELEXA) 20 MG tablet TAKE 1/2 TABLET BY MOUTH DAILY FOR 7 DAYS, THEN TAKE 1 TABLET BY MOUTH DAILY. 08/28/20 08/28/21 Yes   citalopram (CELEXA) 20 MG tablet  Take 1 tablet (20 mg total) by mouth daily. 09/25/20  Yes   dicyclomine (BENTYL) 20 MG tablet Take 1 tablet (20 mg total) by mouth 3 times/day as needed-between meals & bedtime (Abdominal pain). 08/24/20  Yes Inez Catalina, MD  Fremanezumab-vfrm 225 MG/1.5ML SOSY INJECT 225 MG INTO THE SKIN EVERY 30 DAYS. 08/30/20 08/30/21 Yes Inez Catalina, MD  metoprolol succinate (TOPROL-XL) 25 MG 24 hr tablet TAKE 1/2 TABLET (12.5 MG TOTAL) BY MOUTH DAILY. 08/30/20 08/30/21 Yes Inez Catalina, MD  montelukast (SINGULAIR) 10 MG tablet TAKE 1 TABLET (10 MG TOTAL) BY MOUTH AT BEDTIME. 08/24/20 08/24/21 Yes Inez Catalina, MD  omeprazole (PRILOSEC) 20 MG capsule TAKE 1 CAPSULE (20 MG TOTAL) BY MOUTH DAILY.  08/24/20 08/24/21 Yes Inez Catalina, MD  predniSONE (DELTASONE) 10 MG tablet Take 4 tablets (40 mg total) by mouth daily with breakfast for 5 days. 10/14/20 10/19/20 Yes Placido Sou, PA-C  topiramate (TOPAMAX) 50 MG tablet TAKE 2 TABLETS BY MOUTH TWO TIMES DAILY 08/30/20 08/30/21 Yes Inez Catalina, MD  albuterol (VENTOLIN HFA) 108 (90 Base) MCG/ACT inhaler Inhale 1-2 puffs into the lungs every 6 (six) hours as needed for wheezing or shortness of breath. 08/26/19   Inez Catalina, MD  DULoxetine (CYMBALTA) 60 MG capsule Take 1 capsule (60 mg total) by mouth daily. 08/24/20   Inez Catalina, MD  LORazepam (ATIVAN) 0.5 MG tablet TAKE 1 TABLET BY MOUTH ONCE DAILY AS NEEDED FOR SEVERE ANXIETY. THIS IS A 1 MONTH SUPPLY 08/28/20 02/24/21    LORazepam (ATIVAN) 0.5 MG tablet Take 1 tablet (0.5 mg total) by mouth daily as needed for severe anxiety. 09/25/20     meloxicam (MOBIC) 7.5 MG tablet TAKE 2 TABLETS (15 MG TOTAL) BY MOUTH DAILY FOR 14 DAYS. 04/03/20 04/03/21  Theotis Barrio, MD  prazosin (MINIPRESS) 1 MG capsule Take 1 capsule (1 mg total) by mouth at bedtime. 09/25/20     triamcinolone ointment (KENALOG) 0.1 % APPLY 1 APPLICATION TOPICALLY 2 (TWO) TIMES DAILY. 03/28/20 03/28/21  Theotis Barrio, MD    Allergies    Patient has no known allergies.  Review of Systems   Review of Systems  Constitutional: Negative for chills and fever.  HENT: Positive for congestion, postnasal drip and sore throat. Negative for rhinorrhea and trouble swallowing.   Respiratory: Positive for cough.   Cardiovascular: Negative for chest pain.  Gastrointestinal: Negative for abdominal pain, nausea and vomiting.    Physical Exam Updated Vital Signs BP (!) 153/88 (BP Location: Left Arm)   Pulse (!) 53   Temp 98.3 F (36.8 C) (Oral)   Resp 18   Ht 5\' 6"  (1.676 m)   Wt 120.2 kg   SpO2 100%   BMI 42.77 kg/m   Physical Exam Vitals and nursing note reviewed.  Constitutional:      General: Stacey Price is not in acute distress.     Appearance: Normal appearance. Stacey Price is not ill-appearing, toxic-appearing or diaphoretic.  HENT:     Head: Normocephalic and atraumatic.     Right Ear: External ear normal.     Left Ear: External ear normal.     Nose: Congestion present. No rhinorrhea.     Mouth/Throat:     Mouth: Mucous membranes are moist.     Pharynx: Oropharynx is clear. Uvula midline. Posterior oropharyngeal erythema present. No pharyngeal swelling, oropharyngeal exudate or uvula swelling.     Tonsils: No tonsillar exudate or tonsillar abscesses. 0 on the right.  0 on the left.     Comments: Uvula midline.  Readily handling secretions.  Erythema noted in the posterior oropharynx.  No exudates.  No hot potato voice.  Soft submandibular compartments. Eyes:     Extraocular Movements: Extraocular movements intact.  Cardiovascular:     Rate and Rhythm: Normal rate and regular rhythm.     Pulses: Normal pulses.     Heart sounds: Normal heart sounds. No murmur heard. No friction rub. No gallop.   Pulmonary:     Effort: Pulmonary effort is normal. No respiratory distress.     Breath sounds: Normal breath sounds. No stridor. No wheezing, rhonchi or rales.  Abdominal:     General: Abdomen is flat.     Tenderness: There is no abdominal tenderness.  Musculoskeletal:        General: Normal range of motion.     Cervical back: Normal range of motion and neck supple. No tenderness.  Skin:    General: Skin is warm and dry.  Neurological:     General: No focal deficit present.     Mental Status: Stacey Price is alert and oriented to person, place, and time.  Psychiatric:        Mood and Affect: Mood normal.        Behavior: Behavior normal.    ED Results / Procedures / Treatments   Labs (all labs ordered are listed, but only abnormal results are displayed) Labs Reviewed  GROUP A STREP BY PCR   EKG None  Radiology No results found.  Procedures Procedures   Medications Ordered in ED Medications  lidocaine (XYLOCAINE) 2 %  viscous mouth solution 15 mL (15 mLs Mouth/Throat Given 10/14/20 1449)  acetaminophen (TYLENOL) tablet 650 mg (650 mg Oral Given 10/14/20 1449)  ibuprofen (ADVIL) tablet 800 mg (800 mg Oral Given 10/14/20 1448)    ED Course  I have reviewed the triage vital signs and the nursing notes.  Pertinent labs & imaging results that were available during my care of the patient were reviewed by me and considered in my medical decision making (see chart for details).    MDM Rules/Calculators/A&P                          Pt is a 46 y.o. female who presents the emergency department due to a sore throat.  Labs: Strep test is negative.  I, Placido Sou, PA-C, personally reviewed and evaluated these images and lab results as part of my medical decision-making.  Symptoms today are consistent with a viral pharyngitis.  Strep test is negative.  Uvula is midline and Stacey Price is readily handling secretions.  No hot potato voice.  Doubt PTA.  Soft submental and sublingual compartments.  Doubt Ludwig's angina.  Recommended continued use of Tylenol and ibuprofen.  We discussed dosing.  OTC medications for throat pain relief.  Will discharge on a short course of prednisone.  Feel that Stacey Price is stable for discharge at this time and Stacey Price is agreeable.  We discussed return precautions in length.  We discussed symptoms associated with PTA as well as RPA.  Her questions were answered and Stacey Price was amicable at the time of discharge.  Note: Portions of this report may have been transcribed using voice recognition software. Every effort was made to ensure accuracy; however, inadvertent computerized transcription errors may be present.   Final Clinical Impression(s) / ED Diagnoses Final diagnoses:  Viral pharyngitis   Rx / DC Orders ED  Discharge Orders         Ordered    predniSONE (DELTASONE) 10 MG tablet  Daily with breakfast        10/14/20 1558           Placido SouJoldersma, [place] Natarajan, PA-C 10/14/20 1658    Virgina NorfolkCuratolo, Adam,  DO 10/15/20 [id number]

## 2020-10-18 ENCOUNTER — Other Ambulatory Visit (HOSPITAL_COMMUNITY): Payer: Self-pay

## 2020-10-18 ENCOUNTER — Ambulatory Visit (INDEPENDENT_AMBULATORY_CARE_PROVIDER_SITE_OTHER): Payer: BC Managed Care – PPO | Admitting: Student

## 2020-10-18 ENCOUNTER — Other Ambulatory Visit: Payer: Self-pay

## 2020-10-18 DIAGNOSIS — J4540 Moderate persistent asthma, uncomplicated: Secondary | ICD-10-CM | POA: Diagnosis not present

## 2020-10-18 DIAGNOSIS — J984 Other disorders of lung: Secondary | ICD-10-CM

## 2020-10-18 DIAGNOSIS — U071 COVID-19: Secondary | ICD-10-CM

## 2020-10-18 DIAGNOSIS — J302 Other seasonal allergic rhinitis: Secondary | ICD-10-CM | POA: Diagnosis not present

## 2020-10-18 DIAGNOSIS — J4520 Mild intermittent asthma, uncomplicated: Secondary | ICD-10-CM

## 2020-10-18 MED ORDER — PHENOL 1.4 % MT LIQD
1.0000 | OROMUCOSAL | 1 refills | Status: AC | PRN
  Filled 2020-10-18 – 2021-06-13 (×4): qty 177, fill #0

## 2020-10-18 MED ORDER — FLUTICASONE PROPIONATE 50 MCG/ACT NA SUSP
1.0000 | Freq: Every day | NASAL | 5 refills | Status: AC
  Filled 2020-10-18: qty 16, 30d supply, fill #0
  Filled 2021-01-04: qty 16, 30d supply, fill #1
  Filled 2021-03-04: qty 16, 30d supply, fill #2
  Filled 2021-06-13: qty 16, 30d supply, fill #3

## 2020-10-18 MED ORDER — LEVOCETIRIZINE DIHYDROCHLORIDE 5 MG PO TABS
5.0000 mg | ORAL_TABLET | Freq: Every morning | ORAL | 3 refills | Status: AC
  Filled 2020-10-18: qty 30, 30d supply, fill #0
  Filled 2021-01-04: qty 30, 30d supply, fill #1
  Filled 2021-03-04 – 2021-08-19 (×4): qty 30, 30d supply, fill #2

## 2020-10-18 MED ORDER — BREZTRI AEROSPHERE 160-9-4.8 MCG/ACT IN AERO
2.0000 | INHALATION_SPRAY | Freq: Two times a day (BID) | RESPIRATORY_TRACT | 11 refills | Status: AC
  Filled 2020-10-18: qty 10.7, 30d supply, fill #0
  Filled 2020-10-18: qty 10.7, fill #0
  Filled 2021-01-04: qty 10.7, 30d supply, fill #1
  Filled 2021-03-04: qty 10.7, 30d supply, fill #2
  Filled 2021-06-13 – 2021-08-19 (×2): qty 10.7, 30d supply, fill #3

## 2020-10-18 MED ORDER — ALBUTEROL SULFATE HFA 108 (90 BASE) MCG/ACT IN AERS
1.0000 | INHALATION_SPRAY | Freq: Four times a day (QID) | RESPIRATORY_TRACT | 6 refills | Status: AC | PRN
  Filled 2020-10-18: qty 8.5, 25d supply, fill #0
  Filled 2021-01-04: qty 8.5, 25d supply, fill #1
  Filled 2021-03-04: qty 8.5, 25d supply, fill #2
  Filled 2021-06-13: qty 8.5, 25d supply, fill #3

## 2020-10-18 NOTE — Progress Notes (Signed)
  [facility] Internal Medicine Residency Telephone Encounter Continuity Care Appointment  HPI:   This telephone encounter was created for [name] on 10/18/2020 for the following purpose/cc persistent sore throat and allergy symptoms. Please see problem-based assessment and plan for full details.    Past Medical History:  Past Medical History:  Diagnosis Date  . Asthma   . Hypertension   . Migraines   . Obesity   . Panic attacks   . Peritonitis (HCC) 2014  . Pneumonia       Social Hx:  Patient stopped smoking 5 years ago. She drinks alcohol occasionally and denies any illicit drug use currently.   Family History  Problem Relation Age of Onset  . Hypertension Mother   . Hypertension Father   . Breast cancer Cousin   . Ovarian cancer Paternal Grandmother    ROS:   Positive for: congestion, sore throat, bilateral ear pain, itchy and runny eyes, shortness of breath, hoarse voice  Negative for: cough, chest pain, decreased PO intake, nausea, vomiting, abdominal pain, diarrhea, constipation  All others negative    Assessment / Plan / Recommendations:   Please see A&P under problem oriented charting for assessment of the patient's acute and chronic medical conditions.   As always, pt is advised that if symptoms worsen or new symptoms arise, they should go to an urgent care facility or to to ER for further evaluation.   Consent and Medical Decision Making:   Patient discussed with Dr. Oswaldo Done  This is a telephone encounter between Stacey Price and Glenford Bayley on 10/18/2020 for allergic rhinitis. The visit was conducted with the patient located at home and Glenford Bayley at [facility]. The patient's identity was confirmed using their DOB and current address. The patient has consented to being evaluated through a telephone encounter and understands the associated risks (an examination cannot be done and the patient may need to come in for an appointment) / benefits  (allows the patient to remain at home, decreasing exposure to coronavirus). I personally spent 15 minutes on medical discussion.    Glenford Bayley, MD 10/18/2020, 9:41 AM Pager: [phone]

## 2020-10-20 ENCOUNTER — Encounter: Payer: Self-pay | Admitting: Student

## 2020-10-20 DIAGNOSIS — J302 Other seasonal allergic rhinitis: Secondary | ICD-10-CM | POA: Insufficient documentation

## 2020-10-20 NOTE — Assessment & Plan Note (Signed)
Patient endorses a history of asthma and is currently taking albuterol only. She has required increased frequency in dosing, around 3-4 times daily during this acute illness due to increased SOB although she denies CP or cough. She has been prescribed Breztri Aerosphere in the past although was uncertain whether she should take this. She denies any improvement in her symptoms on montelukast. She had been to an urgent care 10/14/20 where she was prescribed low dose prednisone, although per chart review it is not clear this was for asthma.   - Instructed her to use Breztri Aerosphere 2 puffs twice daily as prescribed - Continue albuterol 1-2 puffs q6 hours PRN  - Start daily PO antihistamine - Finish short course of prednisone 10mg  daily  - Discontinue montelukast given no improvement in symptoms

## 2020-10-20 NOTE — Assessment & Plan Note (Signed)
Patient states she had COVID-19 ~ 1 year ago. She has received 2 COVID-19 vaccinations. Denies any sick contacts, loss of taste or smell, or GI symptoms. Given symptoms have occurred seasonally and are typical for allergies, doubt current COVID-19 symptoms.   - Recommend against testing given duration of symptoms (~1 week)  - Prefers to hold off on Booster shot at this time; discussed she is eligible any time

## 2020-10-20 NOTE — Assessment & Plan Note (Signed)
Patient's symptoms initially started off with sore throat, although were later followed by nasal congestion, itchy and watery eyes, bilateral ear pain and fullness, and itchy throat. She has had seasonal allergies in the past and is atopic, making seasonal allergies more likely the cause. She is not currently on a daily antihistamine although had been on loratadine with incomplete response in the past. She has never seen an allergist.   - Prescribed Xyzal 5mg  daily  - Recommended flonase 1-2 sprays daily  - Recommended nasal lavage prior to flonase if tolerated  - Chloraseptic throat spray PRN  - Encouraged her to call back / speak with an allergist if symptoms persist

## 2020-10-22 NOTE — Progress Notes (Signed)
Internal Medicine Clinic Attending  Case discussed with Dr. Speakman  At the time of the visit.  We reviewed the resident's history and exam and pertinent patient test results.  I agree with the assessment, diagnosis, and plan of care documented in the resident's note.  

## 2020-10-23 ENCOUNTER — Other Ambulatory Visit (HOSPITAL_COMMUNITY): Payer: Self-pay

## 2020-10-23 DIAGNOSIS — F411 Generalized anxiety disorder: Secondary | ICD-10-CM | POA: Diagnosis not present

## 2020-10-23 DIAGNOSIS — F331 Major depressive disorder, recurrent, moderate: Secondary | ICD-10-CM | POA: Diagnosis not present

## 2020-10-23 DIAGNOSIS — F4310 Post-traumatic stress disorder, unspecified: Secondary | ICD-10-CM | POA: Diagnosis not present

## 2020-10-23 MED ORDER — LORAZEPAM 0.5 MG PO TABS
0.5000 mg | ORAL_TABLET | Freq: Every day | ORAL | 0 refills | Status: DC | PRN
  Filled 2020-10-23: qty 20, 30d supply, fill #0
  Filled 2021-03-04: qty 20, 20d supply, fill #0

## 2020-10-23 MED ORDER — CITALOPRAM HYDROBROMIDE 20 MG PO TABS
20.0000 mg | ORAL_TABLET | Freq: Every day | ORAL | 0 refills | Status: DC
  Filled 2020-10-23: qty 30, 30d supply, fill #0

## 2020-10-23 MED ORDER — PRAZOSIN HCL 1 MG PO CAPS
1.0000 mg | ORAL_CAPSULE | Freq: Every evening | ORAL | 0 refills | Status: DC
  Filled 2020-10-23: qty 30, 30d supply, fill #0

## 2020-10-23 MED ORDER — BUSPIRONE HCL 15 MG PO TABS
15.0000 mg | ORAL_TABLET | Freq: Three times a day (TID) | ORAL | 0 refills | Status: DC
  Filled 2020-10-23 – 2021-04-01 (×2): qty 90, 30d supply, fill #0

## 2020-10-31 ENCOUNTER — Other Ambulatory Visit (HOSPITAL_COMMUNITY): Payer: Self-pay

## 2020-11-05 DIAGNOSIS — F4310 Post-traumatic stress disorder, unspecified: Secondary | ICD-10-CM | POA: Diagnosis not present

## 2020-11-05 DIAGNOSIS — F331 Major depressive disorder, recurrent, moderate: Secondary | ICD-10-CM | POA: Diagnosis not present

## 2020-11-05 DIAGNOSIS — F411 Generalized anxiety disorder: Secondary | ICD-10-CM | POA: Diagnosis not present

## 2020-11-16 ENCOUNTER — Ambulatory Visit: Payer: BC Managed Care – PPO | Admitting: Internal Medicine

## 2020-11-16 ENCOUNTER — Encounter: Payer: Self-pay | Admitting: Internal Medicine

## 2020-11-16 ENCOUNTER — Other Ambulatory Visit: Payer: Self-pay

## 2020-11-16 VITALS — BP 136/88 | HR 69 | Temp 98.2°F | Ht 67.0 in | Wt 269.4 lb

## 2020-11-16 DIAGNOSIS — R112 Nausea with vomiting, unspecified: Secondary | ICD-10-CM | POA: Diagnosis not present

## 2020-11-16 DIAGNOSIS — R109 Unspecified abdominal pain: Secondary | ICD-10-CM

## 2020-11-16 DIAGNOSIS — G43709 Chronic migraine without aura, not intractable, without status migrainosus: Secondary | ICD-10-CM

## 2020-11-16 DIAGNOSIS — Z8542 Personal history of malignant neoplasm of other parts of uterus: Secondary | ICD-10-CM | POA: Diagnosis not present

## 2020-11-16 DIAGNOSIS — F331 Major depressive disorder, recurrent, moderate: Secondary | ICD-10-CM

## 2020-11-16 DIAGNOSIS — R140 Abdominal distension (gaseous): Secondary | ICD-10-CM | POA: Diagnosis not present

## 2020-11-16 DIAGNOSIS — I10 Essential (primary) hypertension: Secondary | ICD-10-CM

## 2020-11-16 DIAGNOSIS — L309 Dermatitis, unspecified: Secondary | ICD-10-CM

## 2020-11-16 DIAGNOSIS — R1013 Epigastric pain: Secondary | ICD-10-CM | POA: Diagnosis not present

## 2020-11-16 LAB — POCT URINALYSIS DIPSTICK
Bilirubin, UA: NEGATIVE
Glucose, UA: NEGATIVE
Ketones, UA: NEGATIVE
Leukocytes, UA: NEGATIVE
Nitrite, UA: NEGATIVE
Protein, UA: NEGATIVE
Spec Grav, UA: 1.02 (ref 1.010–1.025)
Urobilinogen, UA: 1 E.U./dL
pH, UA: 7 (ref 5.0–8.0)

## 2020-11-16 LAB — POCT URINE PREGNANCY: Preg Test, Ur: NEGATIVE

## 2020-11-16 NOTE — Progress Notes (Signed)
Subjective:    Patient ID: Stacey Price, female    DOB: Jan 27, 1976, 45 y.o.   MRN: [id number]  CC: 2 month in person follow up for chronic asthma, HTN  HPI  Stacey Price is a 45 year old woman with PMH of asthma, chronic daily headache, migraine, GERD, HTN, h/o uterine cancer, panic attacks, MDD, allergy who presents for follow up.   Stacey Price has a new complaint of flank pain X 1 week.  She feels like it is sharp and on the left side near her back, axillary line.  She further reports feeling of distention when she eats including hard distention of her abdomen that sometimes takes 12 hours to resolve.  She has epigastric pain and RUQ pain.  She has no constipation or bowel issues.  She has not had colon cancer screening.  She does have a history of uterine cancer in 2010 for which she had a hysterectomy.  She has had paracentesis before related to this diagnosis, but this was at least 7 years ago.  She is concerned that this issue is returning.  She has not had any dysuria or hematuria that she has noticed.  She has no pain or swelling in her lower abdomen.  She had a CT Scan of the abdomen in 2019 which showed a liver cyst, but no other findings.  She has not been taking anything for the pain.  Further symptoms include nausea, achyness, decreased eating.   She further notes that her nipple dermatitis has improved.  She now has a very small patch on her right upper back which is itchy and scaly.  She notes getting these many times in the past and they will sometimes leave a dark spot.  Area improved with kenalog cream.  New area is very small, but itchy.   She has made changes to her lotions and clothes washing detergents.   Otherwise, her migraines are better controlled on Ajovy injections.  She is now using rescue medication only about 2-3 times per week as opposed to daily.  She is following with a mental health NP, Armandina Gemma for her panic and MDD.  She is on citalopram, buspar, prazosin and  lorazepam.    Review of Systems  Constitutional: Negative for activity change, appetite change, fatigue and fever.  Respiratory: Negative for cough, choking and shortness of breath.   Cardiovascular: Negative for chest pain, palpitations and leg swelling.  Gastrointestinal: Positive for abdominal distention, abdominal pain and nausea. Negative for blood in stool, constipation, diarrhea and rectal pain.  Genitourinary: Positive for flank pain (left). Negative for difficulty urinating, dysuria, enuresis, hematuria and pelvic pain.  Musculoskeletal: Positive for arthralgias.  Skin: Positive for color change and rash.  Neurological: Positive for headaches. Negative for dizziness and weakness.  Psychiatric/Behavioral: Positive for decreased concentration and dysphoric mood.       Objective:   Physical Exam Constitutional:      General: She is not in acute distress.    Appearance: Normal appearance. She is obese. She is not toxic-appearing.  HENT:     Head: Normocephalic and atraumatic.  Eyes:     General:        Right eye: No discharge.        Left eye: No discharge.     Conjunctiva/sclera: Conjunctivae normal.  Cardiovascular:     Rate and Rhythm: Normal rate and regular rhythm.     Heart sounds: No murmur heard.   Pulmonary:     Effort: Pulmonary  effort is normal. No respiratory distress.  Abdominal:     General: There is no distension.     Palpations: Abdomen is soft. There is no mass.     Tenderness: There is abdominal tenderness. There is left CVA tenderness.     Comments: Obese. + TTP in the epigastrium and in the RUQ.  She has + pain with palpation over the left flank.  She has + CVA tenderness on the left as well.  She has no distention, no mass palpated.   Skin:    General: Skin is warm and dry.     Findings: Rash (She has a very small dime sized scaly rash on the right upper back, no central clearing) present.  Neurological:     General: No focal deficit present.      Mental Status: She is alert. Mental status is at baseline.  Psychiatric:        Mood and Affect: Mood normal.        Behavior: Behavior normal.     Lipase, CMET, HCV today  Plan for CT of the abdomen and pelvis. Given iodinated contrast shortage, will plan to do non contrasted unless something shows up on imaging.       Assessment & Plan:  Return in 1-2 months.

## 2020-11-16 NOTE — Patient Instructions (Signed)
Ms. Demonbreun - -  Thank you for coming in today.   For your flank pain, abdominal pain: We will do some blood work today and then plan for a CT scan of your abdomen and pelvis.    We will discuss further colon cancer screening at next visit.   I will call you with any results.    Thank you!  Come back to see me in 1-2 months, after CT scan.

## 2020-11-17 LAB — LIPASE: Lipase: 16 U/L (ref 14–72)

## 2020-11-17 LAB — HEPATITIS C ANTIBODY: Hep C Virus Ab: <0.1 s/co ratio (ref 0.0–0.9)

## 2020-11-17 LAB — CMP14 + ANION GAP
ALT: 13 IU/L (ref 0–32)
AST: 14 IU/L (ref 0–40)
Albumin/Globulin Ratio: 1.4 (ref 1.2–2.2)
Albumin: 4 g/dL (ref 3.8–4.8)
Alkaline Phosphatase: 59 IU/L (ref 44–121)
Anion Gap: 15 mmol/L (ref 10.0–18.0)
BUN/Creatinine Ratio: 10 (ref 9–23)
BUN: 8 mg/dL (ref 6–24)
Bilirubin Total: 0.8 mg/dL (ref 0.0–1.2)
CO2: 22 mmol/L (ref 20–29)
Calcium: 8.9 mg/dL (ref 8.7–10.2)
Chloride: 101 mmol/L (ref 96–106)
Creatinine, Ser: 0.79 mg/dL (ref 0.57–1.00)
Globulin, Total: 2.8 g/dL (ref 1.5–4.5)
Glucose: 80 mg/dL (ref 65–99)
Potassium: 3.7 mmol/L (ref 3.5–5.2)
Sodium: 138 mmol/L (ref 134–144)
Total Protein: 6.8 g/dL (ref 6.0–8.5)
eGFR: 93 mL/min/{1.73_m2} (ref >59)

## 2020-11-20 ENCOUNTER — Other Ambulatory Visit (HOSPITAL_COMMUNITY): Payer: Self-pay

## 2020-11-20 DIAGNOSIS — F411 Generalized anxiety disorder: Secondary | ICD-10-CM | POA: Diagnosis not present

## 2020-11-20 DIAGNOSIS — F331 Major depressive disorder, recurrent, moderate: Secondary | ICD-10-CM | POA: Diagnosis not present

## 2020-11-20 MED ORDER — LORAZEPAM 0.5 MG PO TABS
0.5000 mg | ORAL_TABLET | Freq: Two times a day (BID) | ORAL | 0 refills | Status: DC | PRN
  Filled 2020-11-20: qty 40, 30d supply, fill #0
  Filled 2021-04-01: qty 40, 20d supply, fill #0

## 2020-11-20 MED ORDER — PRAZOSIN HCL 2 MG PO CAPS
2.0000 mg | ORAL_CAPSULE | Freq: Every day | ORAL | 0 refills | Status: DC
  Filled 2020-11-20 – 2020-12-05 (×2): qty 30, 30d supply, fill #0

## 2020-11-20 MED ORDER — BUSPIRONE HCL 15 MG PO TABS
15.0000 mg | ORAL_TABLET | Freq: Three times a day (TID) | ORAL | 0 refills | Status: DC
  Filled 2020-11-20 – 2021-03-04 (×2): qty 90, 30d supply, fill #0

## 2020-11-20 MED ORDER — CITALOPRAM HYDROBROMIDE 20 MG PO TABS
20.0000 mg | ORAL_TABLET | Freq: Every day | ORAL | 0 refills | Status: DC
  Filled 2020-11-20 – 2021-04-01 (×2): qty 30, 30d supply, fill #0

## 2020-11-21 ENCOUNTER — Telehealth: Payer: Self-pay | Admitting: Internal Medicine

## 2020-11-21 NOTE — Telephone Encounter (Signed)
Pls contact pt regarding results and she is in a lot of pain [phone]

## 2020-11-23 ENCOUNTER — Encounter: Payer: Self-pay | Admitting: Internal Medicine

## 2020-11-23 NOTE — Telephone Encounter (Signed)
Patient called back regarding continued pain. Read above note to her from Dr. Criselda Peaches. Patient states she is ready to have the CT done. Will forward to [place] to assist patient in scheduling.

## 2020-11-26 NOTE — Assessment & Plan Note (Signed)
She notes doing well on Ajovy and topiramate.  She only has to use her rescue medication 2-3X per week which is an improvement.   Continue Ajovy and topiramate.

## 2020-11-26 NOTE — Assessment & Plan Note (Signed)
This is improved.  However, she notes regularly getting small sized itchy spots on her back, sides and arms.  These then sometimes get better or sometimes stay as dark macules.    I think this would warrant a biopsy in the future, does not appear to be fungal.  For now, would recommend she use topical steroid cream and see if they improve.

## 2020-11-26 NOTE — Assessment & Plan Note (Signed)
She is treated by a mental health NP.  She is getting citalopram, prazosin, buspar and low dose of lorazepam.   Continue follow up with her mental health provider.

## 2020-11-26 NOTE — Assessment & Plan Note (Addendum)
She notes a feeling of worsening flank pain and upper abdomen pain.  She notes a history of having fluid drained from her abdomen in the past around the time of her diagnosis with uterine cancer.  She notes the pain wakes her up at night.  She has not lost weight, no B symptoms.   Plan CT scan of the abdomen and pelvis Lipase CMET  She has a family history of colon cancer in her MGM and cancer in her PGM

## 2020-11-28 ENCOUNTER — Other Ambulatory Visit (HOSPITAL_COMMUNITY): Payer: Self-pay

## 2020-11-28 DIAGNOSIS — F411 Generalized anxiety disorder: Secondary | ICD-10-CM | POA: Diagnosis not present

## 2020-11-28 DIAGNOSIS — F4310 Post-traumatic stress disorder, unspecified: Secondary | ICD-10-CM | POA: Diagnosis not present

## 2020-11-28 DIAGNOSIS — F331 Major depressive disorder, recurrent, moderate: Secondary | ICD-10-CM | POA: Diagnosis not present

## 2020-12-04 ENCOUNTER — Telehealth: Payer: Self-pay | Admitting: *Deleted

## 2020-12-04 NOTE — Telephone Encounter (Signed)
Pt states Dr Judeth Horn has ordered a CT chest and has been scheduled on July 27 @ 1440PM. Dr Criselda Peaches also has ordered CT abd - wants to know if it can be schedule also on this date at [facility]. Thanks

## 2020-12-05 ENCOUNTER — Other Ambulatory Visit (HOSPITAL_COMMUNITY): Payer: Self-pay

## 2020-12-05 MED FILL — Metoprolol Succinate Tab ER 24HR 25 MG (Tartrate Equiv): ORAL | 30 days supply | Qty: 30 | Fill #0 | Status: AC

## 2020-12-05 MED FILL — Fremanezumab-vfrm Subcutaneous Soln Pref Syr 225 MG/1.5ML: SUBCUTANEOUS | 30 days supply | Qty: 1.5 | Fill #1 | Status: AC

## 2020-12-06 NOTE — Telephone Encounter (Signed)
Spoke with the patient and she is aware of her sooner appt listed below sch with [facility] instead of [employer].    Name: Stacey Price, Stacey Price MRN: [id number]  Date: 12/13/2020 Status: Sch  Time: 10:30 AM Length: 30  Visit Type: CT ABDOMEN PELVIS WO CONTRAST [[id number]] Copay: $0.00  Provider: Kathreen Devoid 2 Department: [facility]  Referring Provider: Inez Catalina CSN: [id number]  Notes: PT To ARRIVE @ 1015am [facility] Location / spk w office /NPO / PT to Pick up prep prior

## 2020-12-10 DIAGNOSIS — F4310 Post-traumatic stress disorder, unspecified: Secondary | ICD-10-CM | POA: Diagnosis not present

## 2020-12-10 DIAGNOSIS — F411 Generalized anxiety disorder: Secondary | ICD-10-CM | POA: Diagnosis not present

## 2020-12-10 DIAGNOSIS — F331 Major depressive disorder, recurrent, moderate: Secondary | ICD-10-CM | POA: Diagnosis not present

## 2020-12-10 NOTE — Telephone Encounter (Signed)
Hi Dr. Criselda Peaches,  Do you want her to have a telehealth after her CT on 6/23 or before?   Thanks, [employer]

## 2020-12-10 NOTE — Telephone Encounter (Signed)
Patient returned call and states she is feeling about the same, taking tylenol for pain.  I scheduled a Telehealth appt for Monday, 12/17/20 @ 0945 w/ Dr. Marijo Conception (CT is scheduled for 12/13/20)  Dr. Criselda Peaches, The CT is ordered without contrast, but the patient states she received a message in MyChart that she needs to pick up contrast with instructions on where to pick this up.  Please advise. Thank you SChaplin, RN,BSN

## 2020-12-10 NOTE — Telephone Encounter (Signed)
Can someone contact this patient, see how she is doing, and get her on board for a telehealth visit?  Thanks all.

## 2020-12-10 NOTE — Telephone Encounter (Signed)
Telephone call placed to patient, VM obtained and message left asking her to call clinic back. SChaplin, RN,BSN

## 2020-12-11 NOTE — Telephone Encounter (Signed)
Hi Dr. Criselda Peaches,  I called radiology and they confirmed the order was placed correctly.  No IV contrast, but she does have to pick up oral contrast at [facility] beforehand.  I let Stacey Price know they message she received was correct and she states she will pick up the oral contrast. Thank you, Vernie Shanks

## 2020-12-13 ENCOUNTER — Ambulatory Visit (HOSPITAL_COMMUNITY)
Admission: RE | Admit: 2020-12-13 | Discharge: 2020-12-13 | Disposition: A | Discharge status: Home / Self Care | Payer: BC Managed Care – PPO | Source: Ambulatory Visit | Attending: Internal Medicine | Admitting: Internal Medicine

## 2020-12-13 ENCOUNTER — Other Ambulatory Visit: Payer: Self-pay

## 2020-12-13 DIAGNOSIS — R140 Abdominal distension (gaseous): Secondary | ICD-10-CM

## 2020-12-13 DIAGNOSIS — I700 Atherosclerosis of aorta: Secondary | ICD-10-CM | POA: Diagnosis not present

## 2020-12-13 DIAGNOSIS — R112 Nausea with vomiting, unspecified: Secondary | ICD-10-CM | POA: Diagnosis not present

## 2020-12-13 DIAGNOSIS — D7389 Other diseases of spleen: Secondary | ICD-10-CM | POA: Diagnosis not present

## 2020-12-13 DIAGNOSIS — K3189 Other diseases of stomach and duodenum: Secondary | ICD-10-CM | POA: Diagnosis not present

## 2020-12-13 DIAGNOSIS — K6389 Other specified diseases of intestine: Secondary | ICD-10-CM | POA: Diagnosis not present

## 2020-12-17 ENCOUNTER — Ambulatory Visit (INDEPENDENT_AMBULATORY_CARE_PROVIDER_SITE_OTHER): Payer: BC Managed Care – PPO | Admitting: Student

## 2020-12-17 DIAGNOSIS — R109 Unspecified abdominal pain: Secondary | ICD-10-CM | POA: Diagnosis not present

## 2020-12-17 NOTE — Assessment & Plan Note (Addendum)
Stacey Price is presenting via telephone to discuss recent CT results along with her symptoms. She reports that roughly one month ago she started having left flank pain that radiates to her lower back and sometimes causes left leg numbness. Mentions that this occurs throughout the day but is most prevalent at night when she is laying down. It is not affected by food nor has she experienced diarrhea or constipation. She does note that when she sits down for a bowel movement she has to lift pannus up to relieve pressure off her hips. [name] has been taking Tylenol and ibuprofen as prescribed, but reports no improvement of symptoms. Denies weight changes, fevers, chills, dysuria, paresthesias, weakness, urinary/bowel incontinence.   When she was initially evaluated one month ago, lab work up was largely negative (including CMET, lipase, UA). CT scan did not reveal any acute changes, although there was a note regarding spinal degenerative joint disease. With these findings, most likely her pain is musculoskeletal. Differential includes muscle strain d/t pannus and lumbar nerve impingement 2/2 foraminal narrowing. Will obtain MRI for further evaluation. Discussed with [name] that she should continue with conservative management, including Tylenol/ibuprofen, heat/ice compress, exercise as tolerated. Can consider GLP-1 for weight loss moving forward.   - MRI lumbar spine with without contrast - Tylenol 650mg  every 4 hours as needed - Ibuprofen 400mg  every 6 hours as needed - Heat/ice compress

## 2020-12-17 NOTE — Progress Notes (Signed)
  [facility] Internal Medicine Residency Telephone Encounter Continuity Care Appointment  HPI:  This telephone encounter was created for Ms. [name] on 12/17/2020 for the following purpose/cc flank/back pain.   Past Medical History:  Past Medical History:  Diagnosis Date   Asthma    Hypertension    Migraines    Obesity    Panic attacks    Peritonitis (HCC) 2014   Pneumonia      ROS:  As per HPI   Assessment / Plan / Recommendations:  Please see A&P under problem oriented charting for assessment of the patient's acute and chronic medical conditions.  As always, pt is advised that if symptoms worsen or new symptoms arise, they should go to an urgent care facility or to to ER for further evaluation.   Consent and Medical Decision Making:  Patient discussed with Dr. Antony Contras This is a telephone encounter between Chai Verdejo and Evlyn Kanner on 12/17/2020 for flank and back pain. The visit was conducted with the patient located at home and Evlyn Kanner at [facility]. The patient's identity was confirmed using their DOB and current address. The patient has consented to being evaluated through a telephone encounter and understands the associated risks (an examination cannot be done and the patient may need to come in for an appointment) / benefits (allows the patient to remain at home, decreasing exposure to coronavirus). I personally spent 13 minutes on medical discussion.

## 2020-12-20 NOTE — Progress Notes (Signed)
Internal Medicine Clinic Attending ? ?Case discussed with Dr. Braswell  At the time of the visit.  We reviewed the resident?s history and exam and pertinent patient test results.  I agree with the assessment, diagnosis, and plan of care documented in the resident?s note.  ?

## 2020-12-25 ENCOUNTER — Encounter: Payer: Self-pay | Admitting: *Deleted

## 2020-12-26 ENCOUNTER — Telehealth: Payer: Self-pay

## 2020-12-26 ENCOUNTER — Other Ambulatory Visit (HOSPITAL_COMMUNITY): Payer: Self-pay

## 2020-12-26 MED ORDER — MEDI-PATCH-LIDOCAINE 0.5-0.035-5-20 % EX PTCH
1.0000 | MEDICATED_PATCH | Freq: Every day | CUTANEOUS | 1 refills | Status: DC
  Filled 2020-12-26: qty 30, fill #0

## 2020-12-26 NOTE — Addendum Note (Signed)
Addended by: Debe Coder B on: 12/26/2020 03:27 PM   Modules accepted: Orders

## 2020-12-26 NOTE — Telephone Encounter (Signed)
Requesting to speak with a nurse about getting Rx for pain patch.

## 2020-12-26 NOTE — Telephone Encounter (Signed)
I am not sure if a patch will help if she is having radiculopathy type pain.   Will do a trial of lidocaine patch.  If not helpful, would request she come in to be seen.   Her MRI is due on 7/12 and should give [place] more information.   Debe Coder, MD

## 2020-12-26 NOTE — Telephone Encounter (Signed)
RTC, patient c/o continued pain to left thigh towards spine and left leg which is worsening.  She states pain is intermittent and rates it between 8 and 10 on most days.  She has been using tylenol and ibuprofen without relief.  She is requesting a pain patch.  Will send to PCP to advise. Thank you, SChaplin, RN,BSN

## 2020-12-27 ENCOUNTER — Telehealth: Payer: Self-pay

## 2020-12-27 DIAGNOSIS — F411 Generalized anxiety disorder: Secondary | ICD-10-CM | POA: Diagnosis not present

## 2020-12-27 DIAGNOSIS — F331 Major depressive disorder, recurrent, moderate: Secondary | ICD-10-CM | POA: Diagnosis not present

## 2020-12-27 DIAGNOSIS — F4310 Post-traumatic stress disorder, unspecified: Secondary | ICD-10-CM | POA: Diagnosis not present

## 2020-12-27 NOTE — Telephone Encounter (Signed)
Error

## 2020-12-27 NOTE — Telephone Encounter (Signed)
This was addressed in the encounter from yesterday.  This encounter closed. SChaplin, RN,BSN

## 2020-12-27 NOTE — Telephone Encounter (Signed)
RTC, VM obtained and message left to call back. °SChaplin, RN,BSN ° °

## 2020-12-27 NOTE — Telephone Encounter (Signed)
Stacey Catalina, MD  P Imp Triage Nurse [facility] team --   I attempted to send in an Rx for a "pain patch" for this patient.  Discussed with pharmacist team.  There is no Rx patch for nerve pain.  She can get lidocaine or capsaicin patches over the counter.   Can you let [name] know and request that she schedule an appointment?   Debe Coder, MD    12/27/20 @ [phone]: TC to patient, VM obtained and message left to call triage nurse back. SChaplin, RN,BSN

## 2020-12-27 NOTE — Telephone Encounter (Signed)
Pt is requesting a call back .. she stated that one of the nurses had called her back and  left a message for her to call back regarding her call yesterday

## 2020-12-27 NOTE — Telephone Encounter (Signed)
Patient called back, she was given Dr. Donnetta Hutching instructions.  She was given a f/u appt on 01/03/21 @ 1015 w/ Dr. August Saucer, Dr. Criselda Peaches is attending that day. SChaplin, RN,BSN

## 2021-01-01 ENCOUNTER — Other Ambulatory Visit: Payer: Self-pay

## 2021-01-01 ENCOUNTER — Ambulatory Visit (HOSPITAL_COMMUNITY)
Admission: RE | Admit: 2021-01-01 | Discharge: 2021-01-01 | Disposition: A | Discharge status: Home / Self Care | Payer: BC Managed Care – PPO | Source: Ambulatory Visit | Attending: Internal Medicine | Admitting: Internal Medicine

## 2021-01-01 ENCOUNTER — Other Ambulatory Visit (HOSPITAL_COMMUNITY): Payer: Self-pay

## 2021-01-01 DIAGNOSIS — R109 Unspecified abdominal pain: Secondary | ICD-10-CM | POA: Diagnosis not present

## 2021-01-01 DIAGNOSIS — F331 Major depressive disorder, recurrent, moderate: Secondary | ICD-10-CM | POA: Diagnosis not present

## 2021-01-01 DIAGNOSIS — M545 Low back pain, unspecified: Secondary | ICD-10-CM | POA: Diagnosis not present

## 2021-01-01 DIAGNOSIS — F411 Generalized anxiety disorder: Secondary | ICD-10-CM | POA: Diagnosis not present

## 2021-01-01 MED ORDER — GADOBUTROL 1 MMOL/ML IV SOLN
10.0000 mL | Freq: Once | INTRAVENOUS | Status: AC | PRN
  Administered 2021-01-01: 10 mL via INTRAVENOUS

## 2021-01-01 MED ORDER — PRAZOSIN HCL 2 MG PO CAPS
2.0000 mg | ORAL_CAPSULE | Freq: Every day | ORAL | 0 refills | Status: AC
  Filled 2021-01-01: qty 30, 30d supply, fill #0

## 2021-01-01 MED ORDER — LORAZEPAM 0.5 MG PO TABS
0.5000 mg | ORAL_TABLET | Freq: Two times a day (BID) | ORAL | 0 refills | Status: DC | PRN
  Filled 2021-01-01: qty 40, 30d supply, fill #0

## 2021-01-01 MED ORDER — BUSPIRONE HCL 15 MG PO TABS
15.0000 mg | ORAL_TABLET | Freq: Three times a day (TID) | ORAL | 0 refills | Status: DC
  Filled 2021-01-01: qty 90, 30d supply, fill #0

## 2021-01-01 MED ORDER — CITALOPRAM HYDROBROMIDE 40 MG PO TABS
40.0000 mg | ORAL_TABLET | Freq: Every day | ORAL | 0 refills | Status: DC
  Filled 2021-01-01: qty 30, 30d supply, fill #0

## 2021-01-03 ENCOUNTER — Encounter: Payer: BC Managed Care – PPO | Admitting: Internal Medicine

## 2021-01-04 ENCOUNTER — Other Ambulatory Visit (HOSPITAL_COMMUNITY): Payer: Self-pay

## 2021-01-04 MED FILL — Fremanezumab-vfrm Subcutaneous Soln Pref Syr 225 MG/1.5ML: SUBCUTANEOUS | 30 days supply | Qty: 1.5 | Fill #2 | Status: AC

## 2021-01-04 MED FILL — Omeprazole Cap Delayed Release 20 MG: ORAL | 30 days supply | Qty: 30 | Fill #0 | Status: AC

## 2021-01-04 MED FILL — Montelukast Sodium Tab 10 MG (Base Equiv): ORAL | 30 days supply | Qty: 30 | Fill #0 | Status: AC

## 2021-01-09 ENCOUNTER — Other Ambulatory Visit (HOSPITAL_COMMUNITY): Payer: Self-pay

## 2021-01-10 ENCOUNTER — Ambulatory Visit (INDEPENDENT_AMBULATORY_CARE_PROVIDER_SITE_OTHER): Payer: BC Managed Care – PPO | Admitting: Internal Medicine

## 2021-01-10 ENCOUNTER — Other Ambulatory Visit (HOSPITAL_COMMUNITY): Payer: Self-pay

## 2021-01-10 VITALS — BP 130/83 | HR 65 | Wt 272.0 lb

## 2021-01-10 DIAGNOSIS — R109 Unspecified abdominal pain: Secondary | ICD-10-CM

## 2021-01-10 DIAGNOSIS — F331 Major depressive disorder, recurrent, moderate: Secondary | ICD-10-CM | POA: Diagnosis not present

## 2021-01-10 DIAGNOSIS — M5126 Other intervertebral disc displacement, lumbar region: Secondary | ICD-10-CM

## 2021-01-10 DIAGNOSIS — M5136 Other intervertebral disc degeneration, lumbar region: Secondary | ICD-10-CM

## 2021-01-10 MED ORDER — LIRAGLUTIDE 18 MG/3ML ~~LOC~~ SOPN
1.8000 mg | PEN_INJECTOR | Freq: Every day | SUBCUTANEOUS | 3 refills | Status: DC
Start: 2021-01-10 — End: 2021-06-12
  Filled 2021-01-10: qty 9, 30d supply, fill #0
  Filled 2021-04-01: qty 9, 30d supply, fill #1

## 2021-01-10 MED ORDER — INSULIN PEN NEEDLE 31G X 5 MM MISC
0 refills | Status: AC
  Filled 2021-01-10: qty 100, 30d supply, fill #0

## 2021-01-10 MED ORDER — DICLOFENAC POTASSIUM 50 MG PO TABS
50.0000 mg | ORAL_TABLET | Freq: Three times a day (TID) | ORAL | 3 refills | Status: DC
  Filled 2021-01-10: qty 90, 30d supply, fill #0

## 2021-01-10 MED ORDER — CYCLOBENZAPRINE HCL 5 MG PO TABS: 5.0000 mg | ORAL_TABLET | Freq: Three times a day (TID) | ORAL | Status: DC | PRN
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
## 2021-01-10 NOTE — Patient Instructions (Addendum)
Thank you for visiting the Internal Medicine Clinic today. It was a pleasure to meet you! Today we discussed the results of your recent MRI for left sided back pain as well as your desire to establish care with a psychiatrist.  I have ordered the following for you:  Referrals: Physical therapy Psychiatry  Medication changes: Prescriptions have been sent in for - Liraglutide: to assist with weight loss - Cyclobenzaprine: muscle relaxer, as needed - Diclofenac: for pain, as needed   Remember: If you have any questions or concerns, please call our clinic at [phone] between 9am-5pm and after hours call [phone] and ask for the internal medicine resident on call. If you feel you are having a medical emergency please call 911.  Champ Mungo, DO

## 2021-01-10 NOTE — Progress Notes (Signed)
   CC: L low back pain  HPI:  [name] is a 45 y.o. female presenting today to discuss recent MRI results. She has been experiencing left sided lower back pain with occasional left leg numbness for around 2 months. She has tried over the counter pain relievers such as tylenol and ibuprofen but reports sub-optimal relief. She denies any trouble with controlling her bowels or bladder. She is does not report sensory concerns at this time, just pain and muscle spasms.  Previous lab work up and CT imaging has been mostly unremarkable. MRI imaging on 01/01/2021 did show no acute abnormality of the lumbar spine, moderate right and severe left L5-S1 facet arthrosis, L3-L4 left asymmetric disc bulge with extraforaminal component in close proximity to the exiting left L3 nerve root, a potential source of left L3 radiculopathy, and no spinal canal or neural foraminal stenosis.  Past Medical History:  Diagnosis Date   Asthma    Hypertension    Migraines    Obesity    Panic attacks    Peritonitis (HCC) 2014   Pneumonia    Review of Systems:  Review of Systems  Constitutional:  Negative for malaise/fatigue and weight loss.  Respiratory:  Negative for shortness of breath.   Cardiovascular:  Negative for chest pain and palpitations.  Gastrointestinal:  Negative for abdominal pain, constipation, diarrhea, nausea and vomiting.  Genitourinary:  Negative for dysuria.  Musculoskeletal:  Positive for back pain and myalgias.  Neurological:  Negative for sensory change, focal weakness, weakness and headaches.  Psychiatric/Behavioral:  Positive for depression.     Physical Exam:  Vitals:   01/10/21 1315  BP: 130/83  Pulse: 65  SpO2: 100%  Weight: 272 lb (123.4 kg)   Physical Exam Vitals and nursing note reviewed.  Constitutional:      Appearance: Normal appearance.  Cardiovascular:     Rate and Rhythm: Normal rate and regular rhythm.     Heart sounds: Normal heart sounds.  Pulmonary:      Effort: Pulmonary effort is normal.     Breath sounds: Normal breath sounds.  Musculoskeletal:        General: Tenderness (over L lower back) present. No deformity or signs of injury.  Skin:    General: Skin is warm and dry.  Neurological:     General: No focal deficit present.     Mental Status: She is alert and oriented to person, place, and time.  Psychiatric:        Mood and Affect: Mood normal. Affect is tearful (when asked further questions regarding PHQ-9 score).     Assessment & Plan:   See Encounters Tab for problem based charting.  Patient seen with Dr. Criselda Peaches

## 2021-01-10 NOTE — Assessment & Plan Note (Signed)
MRI 01/01/2021 revealed no acute abnormality of the lumbar spine, moderate right and severe left L5-S1 facet arthrosis, L3-L4 left asymmetric disc bulge with extraforaminal component in close proximity to the exiting left L3 nerve root, a potential source of left L3 radiculopathy, and no spinal canal or neural foraminal stenosis. Patient reports minimal relief of pain and muscle spasms with over-the-counter medication - Prescription for cyclobenzaprine 5 mg three times daily as needed for muscle spasms, diclofenac 50 mg three times daily as needed for pain sent. - Referral for physical therapy placed.

## 2021-01-10 NOTE — Assessment & Plan Note (Signed)
BMI is 42.60. She states that her ability to exercise has been hindered by the back pain she has been experiencing. She wishes to lose weight and is interested in medication assistance. - Prescription sent for Liraglutide 1.8 mg injection once daily - Patient advised to let clinic know if insurance will not cover this so that we can explore different options

## 2021-01-11 ENCOUNTER — Other Ambulatory Visit (HOSPITAL_COMMUNITY): Payer: Self-pay

## 2021-01-15 ENCOUNTER — Other Ambulatory Visit (HOSPITAL_COMMUNITY): Payer: Self-pay

## 2021-01-15 ENCOUNTER — Other Ambulatory Visit: Payer: Self-pay | Admitting: Pulmonary Disease

## 2021-01-15 DIAGNOSIS — IMO0001 Reserved for inherently not codable concepts without codable children: Secondary | ICD-10-CM

## 2021-01-15 DIAGNOSIS — R911 Solitary pulmonary nodule: Secondary | ICD-10-CM

## 2021-01-15 NOTE — Progress Notes (Signed)
Internal Medicine Clinic Attending  I saw and evaluated the patient.  I personally confirmed the key portions of the history and exam documented by Dr.  Dean  and I reviewed pertinent patient test results.  The assessment, diagnosis, and plan were formulated together and I agree with the documentation in the resident's note.  

## 2021-01-16 ENCOUNTER — Other Ambulatory Visit: Payer: BC Managed Care – PPO

## 2021-01-31 ENCOUNTER — Other Ambulatory Visit: Payer: BC Managed Care – PPO

## 2021-02-05 ENCOUNTER — Other Ambulatory Visit: Payer: BC Managed Care – PPO

## 2021-03-04 ENCOUNTER — Other Ambulatory Visit: Payer: Self-pay

## 2021-03-04 MED FILL — Fremanezumab-vfrm Subcutaneous Soln Pref Syr 225 MG/1.5ML: SUBCUTANEOUS | 30 days supply | Qty: 1.5 | Fill #3 | Status: AC

## 2021-03-04 MED FILL — Omeprazole Cap Delayed Release 20 MG: ORAL | 30 days supply | Qty: 30 | Fill #1 | Status: CN

## 2021-03-04 MED FILL — Montelukast Sodium Tab 10 MG (Base Equiv): ORAL | 30 days supply | Qty: 30 | Fill #1 | Status: CN

## 2021-03-05 ENCOUNTER — Other Ambulatory Visit: Payer: Self-pay

## 2021-03-26 ENCOUNTER — Telehealth: Payer: Self-pay | Admitting: Internal Medicine

## 2021-03-26 ENCOUNTER — Other Ambulatory Visit (HOSPITAL_COMMUNITY): Payer: Self-pay

## 2021-03-26 DIAGNOSIS — J4520 Mild intermittent asthma, uncomplicated: Secondary | ICD-10-CM

## 2021-03-26 NOTE — Telephone Encounter (Signed)
Pt called back to states she was receiving her Albuterol solution from the following and it expired 11/2020.   [facility]  [address] [place], [place] [address]  [phone]

## 2021-03-26 NOTE — Telephone Encounter (Signed)
Refill Request   Pt is requesting a Refill on her Albuterol solution for her nebulizer machine.  Patient states the pharmacy does not have this being on file an asked for her to contact her PCP.   [facility] in [place] Address: [address] [place], [place], [place] [address]  Phone: [phone]

## 2021-03-27 ENCOUNTER — Other Ambulatory Visit: Payer: Self-pay

## 2021-03-27 MED ORDER — ALBUTEROL SULFATE (2.5 MG/3ML) 0.083% IN NEBU
2.5000 mg | INHALATION_SOLUTION | Freq: Four times a day (QID) | RESPIRATORY_TRACT | 12 refills | Status: AC | PRN
Start: 2021-03-27 — End: ?
  Filled 2021-03-27: qty 75, 7d supply, fill #0
  Filled 2021-06-13: qty 75, 7d supply, fill #1
  Filled 2021-10-24: qty 75, 7d supply, fill #0

## 2021-03-27 NOTE — Telephone Encounter (Signed)
Refill sent.

## 2021-04-01 ENCOUNTER — Other Ambulatory Visit: Payer: Self-pay | Admitting: Internal Medicine

## 2021-04-01 ENCOUNTER — Other Ambulatory Visit: Payer: Self-pay

## 2021-04-02 ENCOUNTER — Other Ambulatory Visit: Payer: Self-pay

## 2021-04-02 MED ORDER — AJOVY 225 MG/1.5ML ~~LOC~~ SOSY
PREFILLED_SYRINGE | SUBCUTANEOUS | 4 refills | Status: DC
  Filled 2021-04-02 – 2021-10-24 (×4): qty 1.5, 30d supply, fill #0

## 2021-04-03 ENCOUNTER — Other Ambulatory Visit: Payer: Self-pay

## 2021-04-04 ENCOUNTER — Other Ambulatory Visit: Payer: Self-pay | Admitting: Internal Medicine

## 2021-04-04 ENCOUNTER — Other Ambulatory Visit: Payer: Self-pay

## 2021-04-04 DIAGNOSIS — Z1231 Encounter for screening mammogram for malignant neoplasm of breast: Secondary | ICD-10-CM

## 2021-04-10 ENCOUNTER — Telehealth: Payer: Self-pay | Admitting: *Deleted

## 2021-04-10 ENCOUNTER — Ambulatory Visit (INDEPENDENT_AMBULATORY_CARE_PROVIDER_SITE_OTHER): Payer: BC Managed Care – PPO | Admitting: Internal Medicine

## 2021-04-10 DIAGNOSIS — B349 Viral infection, unspecified: Secondary | ICD-10-CM | POA: Diagnosis not present

## 2021-04-10 HISTORY — DX: Viral infection, unspecified: B34.9

## 2021-04-10 NOTE — Assessment & Plan Note (Signed)
For the past 3 days patient has been experiencing chills, congestion, postnasal drip, dry cough and arthralgias.  Patient states that she has to use her rescue inhaler twice a day since symptom onset.  Patient used her nebulizer treatment on the first day of symptom onset.  Patient also reports sinus pressure. Patient has a history of sinusitis and asthma.  Patient denies fevers, sore throat, arthralgias, chest pain, nausea, vomiting, constipation or diarrhea.  Patient admits to being around sick contacts her friend and friend's niece had similar symptoms a week ago.  Denies any recent traumas.  Patient states she has at home COVID test and took the test and it was negative.  Patient is COVID vaccinated and has allergy to flu vaccine.   PLAN: Advised to drink lots of fluids Advised to use over-the-counter nasal spray; Flonase Advised if symptoms worsen or develop shortness of breath to go to the ED

## 2021-04-10 NOTE — Telephone Encounter (Signed)
Call from pt. States she's having chills x 3 days; with nasal drainage,sniffles. Denies fever. She has been taking Xyzal  -thinking it may be her sinuses. She has been vaccinated for covid. Telehealth appt schedule for today w/Dr Ariwodo @ 1415 PM.

## 2021-04-10 NOTE — Progress Notes (Signed)
   CC: Flu-like symptoms  This is a telephone encounter between Stacey Price and Stacey Price on 04/10/2021 for cc listed above. The visit was conducted with the patient located at home and Stacey Price at [facility]. The patient's identity was confirmed using their DOB and current address. The patient has consented to being evaluated through a telephone encounter and understands the associated risks (an examination cannot be done and the patient may need to come in for an appointment) / benefits (allows the patient to remain at home, decreasing exposure to coronavirus). I personally spent 20 minutes on medical discussion.   HPI:  [name] is a 45 y.o. with PMH as below.   Please see A&P for assessment of the patient's acute and chronic medical conditions.   Past Medical History:  Diagnosis Date   Asthma    Hypertension    Migraines    Obesity    Panic attacks    Peritonitis (HCC) 2014   Pneumonia   Review of Systems  Constitutional:  Positive for chills and malaise/fatigue. Negative for fever.  HENT:  Positive for congestion and sinus pain. Negative for sore throat.        Post nasal drip   Respiratory:  Positive for cough. Negative for sputum production and shortness of breath.   Cardiovascular:  Negative for chest pain.  Gastrointestinal:  Negative for constipation, diarrhea, nausea and vomiting.  Musculoskeletal:  Positive for myalgias.  Neurological:  Negative for headaches.      Assessment & Plan:   See Encounters Tab for problem based charting.  Patient seen with Dr. Sylvester Harder Internal Medicine Resident

## 2021-04-11 ENCOUNTER — Other Ambulatory Visit: Payer: Self-pay

## 2021-04-12 ENCOUNTER — Ambulatory Visit: Payer: BC Managed Care – PPO

## 2021-04-13 ENCOUNTER — Inpatient Hospital Stay: Admission: RE | Admit: 2021-04-13 | Payer: BC Managed Care – PPO | Source: Ambulatory Visit

## 2021-06-10 ENCOUNTER — Other Ambulatory Visit: Payer: Self-pay

## 2021-06-12 ENCOUNTER — Other Ambulatory Visit (HOSPITAL_COMMUNITY): Payer: Self-pay

## 2021-06-12 ENCOUNTER — Other Ambulatory Visit (HOSPITAL_COMMUNITY)
Admission: RE | Admit: 2021-06-12 | Discharge: 2021-06-12 | Disposition: A | Discharge status: Home / Self Care | Payer: BC Managed Care – PPO | Source: Ambulatory Visit | Attending: Internal Medicine | Admitting: Internal Medicine

## 2021-06-12 ENCOUNTER — Ambulatory Visit (INDEPENDENT_AMBULATORY_CARE_PROVIDER_SITE_OTHER): Payer: BC Managed Care – PPO | Admitting: Internal Medicine

## 2021-06-12 VITALS — BP 153/88 | HR 56 | Temp 98.3°F | Ht 67.0 in | Wt 265.3 lb

## 2021-06-12 DIAGNOSIS — Z0000 Encounter for general adult medical examination without abnormal findings: Secondary | ICD-10-CM

## 2021-06-12 DIAGNOSIS — E785 Hyperlipidemia, unspecified: Secondary | ICD-10-CM

## 2021-06-12 DIAGNOSIS — G43709 Chronic migraine without aura, not intractable, without status migrainosus: Secondary | ICD-10-CM

## 2021-06-12 DIAGNOSIS — Z113 Encounter for screening for infections with a predominantly sexual mode of transmission: Secondary | ICD-10-CM

## 2021-06-12 DIAGNOSIS — R109 Unspecified abdominal pain: Secondary | ICD-10-CM | POA: Diagnosis not present

## 2021-06-12 DIAGNOSIS — F331 Major depressive disorder, recurrent, moderate: Secondary | ICD-10-CM | POA: Diagnosis not present

## 2021-06-12 DIAGNOSIS — I10 Essential (primary) hypertension: Secondary | ICD-10-CM

## 2021-06-12 MED ORDER — METOPROLOL SUCCINATE ER 25 MG PO TB24
25.0000 mg | ORAL_TABLET | Freq: Every day | ORAL | 2 refills | Status: AC
  Filled 2021-06-12 – 2021-06-28 (×2): qty 30, 30d supply, fill #0

## 2021-06-12 MED ORDER — LIRAGLUTIDE 18 MG/3ML ~~LOC~~ SOPN
1.8000 mg | PEN_INJECTOR | Freq: Every day | SUBCUTANEOUS | 3 refills | Status: DC
  Filled 2021-06-12 – 2021-06-28 (×2): qty 9, 30d supply, fill #0
  Filled 2021-08-19: qty 9, 30d supply, fill #1

## 2021-06-12 MED ORDER — DICLOFENAC POTASSIUM 50 MG PO TABS
50.0000 mg | ORAL_TABLET | Freq: Three times a day (TID) | ORAL | 3 refills | Status: AC
  Filled 2021-06-12 – 2021-06-28 (×2): qty 90, 30d supply, fill #0
  Filled 2021-10-24: qty 90, 30d supply, fill #1
  Filled 2021-10-24: qty 90, 30d supply, fill #0

## 2021-06-12 MED ORDER — CYCLOBENZAPRINE HCL 5 MG PO TABS
5.0000 mg | ORAL_TABLET | Freq: Three times a day (TID) | ORAL | 0 refills | Status: DC | PRN
  Filled 2021-06-12 – 2021-06-28 (×2): qty 30, 10d supply, fill #0

## 2021-06-12 NOTE — Assessment & Plan Note (Signed)
Patient is requesting referral for screening colonoscopy. Plan: Referral made.

## 2021-06-12 NOTE — Assessment & Plan Note (Signed)
Patient has been managed with metoprolol 12.5 mg daily.  Initial blood pressure check was elevated at 138/99, with recheck much elevated compared to this. Assessment: Blood pressure not currently controlled with metoprolol 12.5 mg daily.  Goal less than 130/90. Plan: Increase metoprolol to 25 mg daily.  Follow-up in 3 months for blood pressure recheck.

## 2021-06-12 NOTE — Progress Notes (Signed)
° °  CC: Routine follow-up  HPI:  Stacey Price is a 45 y.o. female with past medical history as detailed below who presents today for routine follow-up.  She is also concerned about persistent back pain and requesting STD testing.  Past Medical History:  Diagnosis Date   Asthma    Hypertension    Migraines    Obesity    Panic attacks    Peritonitis (HCC) 2014   Pneumonia    Review of Systems:  Review of Systems  Eyes:  Negative for double vision.  Respiratory:  Negative for shortness of breath.   Cardiovascular:  Negative for chest pain.  Gastrointestinal:  Negative for abdominal pain, constipation and diarrhea.  Genitourinary:  Negative for dysuria.  Musculoskeletal:  Positive for back pain and myalgias.  Neurological:  Positive for headaches. Negative for tingling and weakness.  Endo/Heme/Allergies:  Negative for polydipsia.  Psychiatric/Behavioral:  Positive for depression. The patient is nervous/anxious.     Physical Exam:  Vitals:   06/12/21 1529 06/12/21 1637  BP: (!) 138/99 (!) 153/88  Pulse: 73 (!) 56  Temp: 98.3 F (36.8 C)   TempSrc: Oral   SpO2: 100%   Weight: 265 lb 4.8 oz (120.3 kg)   Height: 5\' 7"  (1.702 m)    Constitutional: Well-appearing female, no acute distress noted. Cardio: Regular rate and rhythm.  No murmurs, rubs, gallops. Pulm: Clear to auscultation bilaterally. Abdomen: Soft, nontender, nondistended. MSK: Negative for extremity edema Skin: Skin is warm and dry. Neuro: Alert and oriented x3.  No focal deficit noted. Psych: Normal mood and affect.  Assessment & Plan:   See Encounters Tab for problem based charting.  Patient seen with Dr. 

## 2021-06-12 NOTE — Assessment & Plan Note (Signed)
Patient voices desire to have full STD panel completed today.  She has not had any new partners and is not concerned that she has been exposed to STDs necessarily however would like reassurance. Plan: Self swab collected for gonorrhea, chlamydia, trichomoniasis.  Lab draw for HIV, syphilis ordered.  I will contact patient once all of these results have come in.

## 2021-06-12 NOTE — Assessment & Plan Note (Signed)
Patient continues to endorse back pain that is made worse by sitting and requires frequent shifting in her seat.  This is particular bothersome at work.  Previous MRI on 01/01/2021 showed no acute abnormality of the lumbar spine, moderate right and severe left L5-S1 facet arthrosis, L3-L4 left asymmetric disc bulge with extraforaminal component and closer 70 to the exiting left L3 nerve root, potential source of left L3 radiculopathy, and no spinal canal or neural foraminal stenosis.  We previously trialed the patient on cyclobenzaprine 5 mg 3 times daily as needed for muscle spasms and diclofenac 50 mg 3 times daily as needed for pain which did not provide significant relief.  She never established with physical therapy after this visit. Assessment: Patient continues to have pain affecting her daily life however we have not tried physical therapy as a management option yet. Plan: We will send in cyclobenzaprine 5 mg 3 times daily as needed for muscle spasms as well as diclofenac 50 mg 3 times daily for pain.  Physical therapy referral made.  I counseled the patient on participating in physical therapy and other conservative management options and reassessing at next follow-up visit.  She may need referral for second opinion for surgical management.

## 2021-06-12 NOTE — Patient Instructions (Addendum)
Thank you for visiting the Internal Medicine Clinic today. It was a pleasure to care for you again! Today we discussed your overall health as well as your continued back pain and desire to have STD check.  On initial check, your blood pressure was a little elevated at 130/99.  On recheck this was **.  At this time I will have you continue taking metoprolol 12.5 mg daily.  For your back pain, I am sorry that you were not able to work with physical therapy after our appointment a few months ago.  I will make sure this referral is placed so that they can work with you to help alleviate your back pain.  I will send in diclofenac and Flexeril for you to use as needed for severe pain or discomfort.  Otherwise as we discussed I recommend using heat, ice, and stretches alongside physical therapy to work to alleviate this pain.  We also collected a swab as well as blood tests to check for STDs.  I will let you know what these results are once they have all come back in.  For your work, we will also collect a lipid panel to check your cholesterol and check an HbA1c which is a screening test for diabetes.  Additionally as requested, I  will place a referral for behavioral health.  I have ordered the following for you:  Lab orders: HbA1c Lipid Profile STD testing for gonorrhea, chlamydia, trichomoniasis, syphilis, and HIV.  Medication changes: Increase metoprolol to 25 mg daily.  Follow-up: In 3 months or sooner if needed.  Remember: If you have any questions or concerns, please call our clinic at [phone] between 9am-5pm and after hours call [phone] and ask for the internal medicine resident on call. If you feel you are having a medical emergency please call 911.  Champ Mungo, DO

## 2021-06-12 NOTE — Assessment & Plan Note (Signed)
Patient reports that she is doing well on BuSpar 15 mg 3 times daily, citalopram 40 mg daily, and is infrequently requiring Ativan 0.5 mg twice daily as needed. Assessment: Patient is requesting referral to behavioral health. Plan: Referral made.

## 2021-06-12 NOTE — Assessment & Plan Note (Signed)
Patient has been taking the Victoza 1.8 mg daily for weight loss. Assessment: Patient is down about 7 pounds since July Plan: We will refill Victoza.  Patient did inquire about Mounjaro, however this is not approved at this time for strictly weight loss purposes.  Unfortunately prior to the patient leaving I had not ordered HbA1c to screen for diabetes, so we can do this at her follow-up visit.  If she is found to be in the prediabetic or diabetic range we could explore transitioning her to a different medication that we will help control her diabetes as well as help with weight loss.

## 2021-06-12 NOTE — Assessment & Plan Note (Signed)
Patient reports her migraine headaches are well controlled at this time with Ajovy to 25 mg and Topamax 100 mg twice daily. Plan: We will continue patient on this medical management.

## 2021-06-13 ENCOUNTER — Other Ambulatory Visit: Payer: Self-pay

## 2021-06-13 ENCOUNTER — Other Ambulatory Visit (HOSPITAL_COMMUNITY): Payer: Self-pay

## 2021-06-13 LAB — CERVICOVAGINAL ANCILLARY ONLY
Chlamydia: NEGATIVE
Comment: NEGATIVE
Comment: NEGATIVE
Comment: NORMAL
Neisseria Gonorrhea: NEGATIVE
Trichomonas: NEGATIVE

## 2021-06-13 LAB — RPR: RPR Ser Ql: NONREACTIVE

## 2021-06-13 LAB — HIV ANTIBODY (ROUTINE TESTING W REFLEX): HIV Screen 4th Generation wRfx: NONREACTIVE

## 2021-06-13 MED FILL — Omeprazole Cap Delayed Release 20 MG: ORAL | 30 days supply | Qty: 30 | Fill #1 | Status: CN

## 2021-06-13 MED FILL — Montelukast Sodium Tab 10 MG (Base Equiv): ORAL | 30 days supply | Qty: 30 | Fill #1 | Status: CN

## 2021-06-13 NOTE — Progress Notes (Signed)
Internal Medicine Clinic Attending  I saw and evaluated the patient.  I personally confirmed the key portions of the history and exam documented by Dr.  Dean  and I reviewed pertinent patient test results.  The assessment, diagnosis, and plan were formulated together and I agree with the documentation in the resident's note.  

## 2021-06-13 NOTE — Addendum Note (Signed)
Addended by: Ihor Dow on: 06/13/2021 11:12 AM   Modules accepted: Orders

## 2021-06-14 ENCOUNTER — Other Ambulatory Visit (HOSPITAL_COMMUNITY): Payer: Self-pay

## 2021-06-18 LAB — LIPID PANEL
Chol/HDL Ratio: 3.3 ratio (ref 0.0–4.4)
Cholesterol, Total: 193 mg/dL (ref 100–199)
HDL: 58 mg/dL (ref >39)
LDL Chol Calc (NIH): 117 mg/dL — ABNORMAL HIGH (ref 0–99)
Triglycerides: 103 mg/dL (ref 0–149)
VLDL Cholesterol Cal: 18 mg/dL (ref 5–40)

## 2021-06-18 LAB — SPECIMEN STATUS REPORT

## 2021-06-21 ENCOUNTER — Other Ambulatory Visit (HOSPITAL_COMMUNITY): Payer: Self-pay

## 2021-06-21 ENCOUNTER — Telehealth: Payer: Self-pay | Admitting: Internal Medicine

## 2021-06-21 DIAGNOSIS — E785 Hyperlipidemia, unspecified: Secondary | ICD-10-CM | POA: Insufficient documentation

## 2021-06-21 NOTE — Assessment & Plan Note (Addendum)
Patient required lipid panel testing for work insurance needs which revealed LDL of 117. She is not currently taking medication for high cholesterol.  Assessment: ASCVD of 3.4%. Plan: No statin recommended at this time. Will encourage lifestyle modifications and check for improvement in 6 months-1 year.

## 2021-06-21 NOTE — Telephone Encounter (Signed)
Attempted to contact patient regarding work insurance form for lipid panel results.  I have filled in the appropriate values and signed the form, however the patient needs to sign the form prior to [place] being faxed the form.  I have informed her that the clinic is closed at this time but that she can reach out to me through MyChart for further clarification.

## 2021-06-24 ENCOUNTER — Other Ambulatory Visit (HOSPITAL_COMMUNITY): Payer: Self-pay

## 2021-06-25 ENCOUNTER — Other Ambulatory Visit (HOSPITAL_COMMUNITY): Payer: Self-pay

## 2021-06-28 ENCOUNTER — Other Ambulatory Visit (HOSPITAL_COMMUNITY): Payer: Self-pay

## 2021-07-03 ENCOUNTER — Telehealth: Payer: Self-pay

## 2021-07-03 ENCOUNTER — Other Ambulatory Visit (HOSPITAL_COMMUNITY): Payer: Self-pay

## 2021-07-03 NOTE — Telephone Encounter (Signed)
Called pt to informed her paperwork for [facility] [employer] has been completed and ready for pick up. Pt did not answer the phone and unable to LVM. Form will be left at the front desk inside the cabin.

## 2021-07-04 ENCOUNTER — Encounter: Payer: Self-pay | Admitting: Internal Medicine

## 2021-07-05 ENCOUNTER — Other Ambulatory Visit (HOSPITAL_COMMUNITY): Payer: Self-pay

## 2021-07-08 ENCOUNTER — Other Ambulatory Visit (HOSPITAL_COMMUNITY): Payer: Self-pay

## 2021-07-10 ENCOUNTER — Other Ambulatory Visit (HOSPITAL_COMMUNITY): Payer: Self-pay

## 2021-07-11 DIAGNOSIS — F331 Major depressive disorder, recurrent, moderate: Secondary | ICD-10-CM | POA: Diagnosis not present

## 2021-07-11 DIAGNOSIS — F4310 Post-traumatic stress disorder, unspecified: Secondary | ICD-10-CM | POA: Diagnosis not present

## 2021-07-11 DIAGNOSIS — F411 Generalized anxiety disorder: Secondary | ICD-10-CM | POA: Diagnosis not present

## 2021-07-18 ENCOUNTER — Ambulatory Visit: Payer: BC Managed Care – PPO | Admitting: Behavioral Health

## 2021-07-18 DIAGNOSIS — F331 Major depressive disorder, recurrent, moderate: Secondary | ICD-10-CM

## 2021-07-18 DIAGNOSIS — F419 Anxiety disorder, unspecified: Secondary | ICD-10-CM

## 2021-07-19 NOTE — BH Specialist Note (Addendum)
Integrated Behavioral Health via Telemedicine Visit  07/19/2021 Racquelle Hyser [id number]  Number of Integrated Behavioral Health visits: 1/6 Session Start time: 2:00pm  Session End time: 2:30pm Total time: 30  Referring Provider: Dr. Cleda Daub, DO Patient/Family location: Pt is home in private [facility] Provider location: Working remotely All persons participating in visit: Pt & Clinician Types of Service: Health Promotion and Introduction only  I connected with [name] and/or Marylene Land Cales's  self  via  Telephone or [employer]  (Video is Surveyor, mining) and verified that I am speaking with the correct person using two identifiers. Discussed confidentiality: Yes   I discussed the limitations of telemedicine and the availability of in person appointments.  Discussed there is a possibility of technology failure and discussed alternative modes of communication if that failure occurs.  I discussed that engaging in this telemedicine visit, they consent to the provision of behavioral healthcare and the services will be billed under their insurance.  Patient and/or legal guardian expressed understanding and consented to Telemedicine visit: Yes   Presenting Concerns: Patient and/or family reports the following symptoms/concerns: Pt has elevated anx/dep due to her concerns for her Dtrs that reside in [place]. Pt's Dtr Van Clines is pregnant w/her first [place]. The recent US completed revealed Baby Boy may not have his L hand. Pt's other Dtr who is the oldest exp'd a stillbirth & is struggling w/this loss. Duration of problem: acutely since Dtr had US done; Severity of problem: moderate-there are deep feelings of loss for the entire Family  Patient and/or Family's Strengths/Protective Factors: Social connections, Social and Emotional competence, Concrete supports in place (healthy food, safe environments, etc.), Sense of purpose, Physical Health (exercise,  healthy diet, medication compliance, etc.), Caregiver has knowledge of parenting & child development, and Parental Resilience  Goals Addressed: Patient will:  Reduce symptoms of: anxiety, depression, and stress   Increase knowledge and/or ability of: coping skills, healthy habits, stress reduction, and healthy needs of a grieving Family    Demonstrate ability to: Increase healthy adjustment to current life circumstances and Begin healthy grieving over loss & ability to advocate for Dtr's prenatal care of her Son in the Healthcare System in [place]  Progress towards Goals: Estb'd today: Pt will attend psychotherapy sessions for support & encouragement & to assist her coping when Family is in [place] & Pt worries for her Dtrs.  Interventions: Interventions utilized:  Solution-Focused Strategies, Mindfulness or Relaxation Training, and Supportive Counseling & Advocacy approaches Pt can use for health of Dtr & her pregnancy. Standardized Assessments completed:  screeners prn  Patient and/or Family Response: Pt receptive & responsive to call today & requests f/u appt  Assessment: Patient currently experiencing elevated anx/dep due to the situation of her 48yo Dtr w/her first pregnancy which has everyone concerned.  Patient may benefit from cont'd sessions to minimize/normalize & validate concerns for Dtrs.  Plan: Follow up with behavioral health clinician on : 2-3 wks on telehealth for 60 min session Behavioral recommendations: Use the concerns you have to drive advocacy for your Dtr & her pregnancy Referral(s): [facility] [employer] (In Clinic)  I discussed the assessment and treatment plan with the patient and/or parent/guardian. They were provided an opportunity to ask questions and all were answered. They agreed with the plan and demonstrated an understanding of the instructions.   They were advised to call back or seek an in-person evaluation if the symptoms worsen or  if the condition fails to improve as anticipated.  [place]  Iverson Alamin, LMFT

## 2021-07-22 ENCOUNTER — Ambulatory Visit: Payer: BC Managed Care – PPO | Admitting: Physical Therapy

## 2021-07-22 NOTE — Therapy (Incomplete)
OUTPATIENT PHYSICAL THERAPY EVALUATION   Patient Name: Stacey Price MRN: [id number] DOB:09-07-1975, 46 y.o., female Today's Date: 07/22/2021    Past Medical History:  Diagnosis Date   Asthma    Hypertension    Migraines    Obesity    Panic attacks    Peritonitis (HCC) 2014   Pneumonia    Past Surgical History:  Procedure Laterality Date   ABDOMINAL HYSTERECTOMY  2009   Parcial    CESAREAN SECTION     1995-2000   Patient Active Problem List   Diagnosis Date Noted   Hyperlipidemia 06/21/2021   Screen for STD (sexually transmitted disease) 06/12/2021   Healthcare maintenance 06/12/2021   Viral infection 04/10/2021   Left flank pain 12/17/2020   Seasonal allergies 10/20/2020   Panic attacks 03/02/2019   Moderate episode of recurrent major depressive disorder (HCC) 11/24/2018   Chronic migraine without aura 03/03/2018   Chronic daily headache 03/03/2018   History of uterine cancer 12/11/2017   History of treatment for tuberculosis 12/11/2017   Obesity, Class III, BMI 40-49.9 (morbid obesity) (HCC) 01/20/2017   Nipple dermatitis 04/15/2016   HTN (hypertension) 10/12/2015   Asthma 10/12/2015    PCP: Inez Catalina, MD  REFERRING PROVIDER: Gust Rung, DO  REFERRING DIAG: Left flank pain  THERAPY DIAG:  No diagnosis found.  ONSET DATE: ***   SUBJECTIVE:              SUBJECTIVE STATEMENT: ***  PERTINENT HISTORY:  ***  PAIN:  Are you having pain? Yes NPRS scale: ***/10 Pain location: Back Pain orientation: Lower  PAIN TYPE: Chronic Pain description: {PAIN DESCRIPTION:21022940}  Aggravating factors: *** Relieving factors: ***  PRECAUTIONS: None  WEIGHT BEARING RESTRICTIONS No  FALLS:  Has patient fallen in last 6 months? No  LIVING ENVIRONMENT: Lives with: {[facility] lives with:25569::"lives with their family"} Lives in: {Lives in:25570} Stairs: {yes/no:20286}; {Stairs:24000} Has following equipment at home: {Assistive  devices:23999}  OCCUPATION: ***  PLOF: Independent  PATIENT GOALS: Pain relief   OBJECTIVE:  DIAGNOSTIC FINDINGS:  MRI Lumbar 01/03/2021 IMPRESSION: 1. No acute abnormality of the lumbar spine. 2. Moderate right and severe left L5-S1 facet arthrosis. 3. L3-L4 left asymmetric disc bulge with extraforaminal component in close proximity to the exiting left L3 nerve root, a potential source of left L3 radiculopathy. 4. No spinal canal or neural foraminal stenosis.  PATIENT SURVEYS:  FOTO ***  SCREENING FOR RED FLAGS: Negative  COGNITION: Overall cognitive status: Within functional limits for tasks assessed     SENSATION:  Light touch: Appears intact  MUSCLE LENGTH: ***  POSTURE:  ***  PALPATION: ***  LUMBAR AROM  AROM AROM  07/22/2021  Flexion   Extension   Right lateral flexion   Left lateral flexion   Right rotation   Left rotation    LE AROM/PROM:   ***  LE MMT:  MMT Right 07/22/2021 Left 07/22/2021  Hip flexion    Hip extension    Hip abduction    Knee flexion    Knee extension    Ankle dorsiflexion    Ankle plantarflexion    Ankle inversion    Ankle eversion     LUMBAR SPECIAL TESTS:  {lumbar special test:25242}  FUNCTIONAL TESTS:  {Functional tests:24029}  GAIT: Distance walked: *** Assistive device utilized: {Assistive devices:23999} Level of assistance: {Levels of assistance:24026} Comments: ***   TODAY'S TREATMENT  ***   PATIENT EDUCATION:  Education details: Exam findings, POC, HEP Person educated: Patient Education method: Explanation, Demonstration,  Tactile cues, Verbal cues, and Handouts Education comprehension: verbalized understanding, returned demonstration, verbal cues required, tactile cues required, and needs further education  HOME EXERCISE PROGRAM: ***   ASSESSMENT: CLINICAL IMPRESSION: Patient is a 46 y.o. female who was seen today for physical therapy evaluation and treatment for chronic low back pain.  Objective impairments include {opptimpairments:25111}. These impairments are limiting patient from {activity limitations:25113}. Personal factors including {Personal factors:25162} are also affecting patient's functional outcome. Patient will benefit from skilled PT to address above impairments and improve overall function.  REHAB POTENTIAL: {rehabpotential:25112}  CLINICAL DECISION MAKING: Stable/uncomplicated  EVALUATION COMPLEXITY: Low   GOALS: Goals reviewed with patient? Yes  SHORT TERM GOALS:  STG Name Target Date Goal status  1 Patient will be I with initial HEP in order to progress with therapy. Baseline:  {follow up:25551} INITIAL  2 PT will review FOTO with patient by 3rd visit in order to understand expected progress and outcome with therapy. Baseline:  {follow up:25551} INITIAL  3 *** Baseline: {follow up:25551} INITIAL   LONG TERM GOALS:   LTG Name Target Date Goal status  1 Patient will be I with final HEP to maintain progress from PT. Baseline: {follow up:25551} INITIAL  2 Patient will report >/= ***% status on FOTO to indicate improved functional ability. Baseline: {follow up:25551} INITIAL  3 *** Baseline: {follow up:25551} INITIAL  4 *** Baseline: {follow up:25551} INITIAL  5 *** Baseline: {follow up:25551} {GOALSTATUS:25110}    PLAN: PT FREQUENCY: 1x/week  PT DURATION: {rehab duration:25117}  PLANNED INTERVENTIONS: Therapeutic exercises, Therapeutic activity, Neuro Muscular re-education, Balance training, Gait training, Patient/Family education, Joint mobilization, Aquatic Therapy, Dry Needling, Electrical stimulation, Spinal mobilization, Cryotherapy, Moist heat, and Manual therapy  PLAN FOR NEXT SESSION: Review HEP and progress PRN, ***   Rosana Hoes, PT, DPT, LAT, ATC 07/22/21  11:05 AM Phone: [phone] Fax: [phone]
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
## 2021-07-31 ENCOUNTER — Encounter: Payer: Self-pay | Admitting: Physical Therapy

## 2021-07-31 ENCOUNTER — Other Ambulatory Visit: Payer: Self-pay

## 2021-07-31 ENCOUNTER — Ambulatory Visit: Payer: BC Managed Care – PPO | Attending: Internal Medicine | Admitting: Physical Therapy

## 2021-07-31 DIAGNOSIS — G8929 Other chronic pain: Secondary | ICD-10-CM | POA: Diagnosis not present

## 2021-07-31 DIAGNOSIS — M6281 Muscle weakness (generalized): Secondary | ICD-10-CM | POA: Insufficient documentation

## 2021-07-31 DIAGNOSIS — M545 Low back pain, unspecified: Secondary | ICD-10-CM | POA: Insufficient documentation

## 2021-07-31 NOTE — Patient Instructions (Signed)
Access Code: [email or web address] URL: [email or web address] Date: 07/31/2021 Prepared by: Rosana Hoes  Exercises Supine Lower Trunk Rotation - 2 x daily - 10 reps - 5 seconds hold Supine Pelvic Tilt - 2 x daily - 10 reps - 5 second hold Seated Pelvic Tilt - 2 x daily - 10 reps - 5 seconds hold

## 2021-08-07 ENCOUNTER — Ambulatory Visit: Payer: BC Managed Care – PPO

## 2021-08-12 ENCOUNTER — Ambulatory Visit: Payer: BC Managed Care – PPO | Admitting: Physical Therapy

## 2021-08-12 DIAGNOSIS — F4310 Post-traumatic stress disorder, unspecified: Secondary | ICD-10-CM | POA: Diagnosis not present

## 2021-08-12 DIAGNOSIS — F411 Generalized anxiety disorder: Secondary | ICD-10-CM | POA: Diagnosis not present

## 2021-08-12 DIAGNOSIS — F331 Major depressive disorder, recurrent, moderate: Secondary | ICD-10-CM | POA: Diagnosis not present

## 2021-08-12 NOTE — Therapy (Incomplete)
OUTPATIENT PHYSICAL THERAPY TREATMENT NOTE   Patient Name: Dionisia Pacholski MRN: [id number] DOB:[birth year], 46 y.o., female Today's Date: 08/12/2021  PCP: Inez Catalina, MD REFERRING PROVIDER: Inez Catalina, MD    Past Medical History:  Diagnosis Date   Asthma    Hypertension    Migraines    Obesity    Panic attacks    Peritonitis (HCC) 2014   Pneumonia    Past Surgical History:  Procedure Laterality Date   ABDOMINAL HYSTERECTOMY  2009   [facility]    CESAREAN SECTION     1995-2000   Patient Active Problem List   Diagnosis Date Noted   Hyperlipidemia 06/21/2021   Screen for STD (sexually transmitted disease) 06/12/2021   Healthcare maintenance 06/12/2021   Viral infection 04/10/2021   Left flank pain 12/17/2020   Seasonal allergies 10/20/2020   Panic attacks 03/02/2019   Moderate episode of recurrent major depressive disorder (HCC) 11/24/2018   Chronic migraine without aura 03/03/2018   Chronic daily headache 03/03/2018   History of uterine cancer 12/11/2017   History of treatment for tuberculosis 12/11/2017   Obesity, Class III, BMI 40-49.9 (morbid obesity) (HCC) 01/20/2017   Nipple dermatitis 04/15/2016   HTN (hypertension) 10/12/2015   Asthma 10/12/2015    REFERRING PROVIDER: Gust Rung, DO   REFERRING DIAG: Left flank pain  THERAPY DIAG:  No diagnosis found.  PERTINENT HISTORY: None  PRECAUTIONS: None  SUBJECTIVE: ***  PAIN:  Are you having pain? Yes NPRS scale: 6/10 Pain location: Back Pain orientation: Lower, left PAIN TYPE: Chronic Pain description: constant, aching, radiates to left leg numbness Aggravating factors: Sitting, laying on left side Relieving factors: Heating pad, medication, getting up and moving if she has been sitting extended periods  PATIENT GOALS: Pain relief   OBJECTIVE:  PATIENT SURVEYS:  FOTO 43% functional status   MUSCLE LENGTH: Patient with hamstring limitation L > R secondary to onset of left low back  pain   POSTURE:  Patient exhibits guarded posture, rounded shoulders, increased lumbar lordosis in standing but reverts to grossly kyphotic on seated   PALPATION: Significant tenders to left lumbar paraspinals with increased muscular tension   LUMBAR AROM   AROM AROM  07/31/2021  Flexion 50% - pulling in left mid/lower back  Extension 50% - concordant left lower back pain  Right lateral flexion 75% - pulling mid back to shoulder blade region   Left lateral flexion 75% - concordant left lower back pain  Right rotation 50% - concordant left lower back pain  Left rotation 75% - concordant left lower back pain    LE AROM/PROM: Limitation with passive hip motion due to onset of concordant left lower back pain and guarding   GAIT: Assistive device utilized: None Level of assistance: Complete Independence Comments: Patient with antalgic gait on left, guarded with decreased trunk rotation    TODAY'S TREATMENT  08/12/2021: Therapeutic Exercise:    07/31/2021 Therapeutic Exercise: LTR 5 x 5 sec each Supine pelvic tilt  5 x 5 sec Seated pelvic tilt x 5  Instruction on using lumbar roll (pillow or towel) for lumbar support at work   PATIENT EDUCATION:  Education details: HEP Person educated: Patient Education method: Programmer, multimedia, Facilities manager, Actor cues, Verbal cues, and Handouts Education comprehension: verbalized understanding, returned demonstration, verbal cues required, tactile cues required, and needs further education   HOME EXERCISE PROGRAM: Access Code: [email or web address]     ASSESSMENT: CLINICAL IMPRESSION: Patient tolerated therapy well with no adverse effects. *** Patient will benefit  from skilled PT to address above impairments and improve overall function.  Objective impairments include Abnormal gait, decreased activity tolerance, decreased ROM, decreased strength, increased muscle spasms, impaired flexibility, improper body mechanics, postural dysfunction, and pain.    Patient is a 46 y.o. female who was seen today for physical therapy evaluation and treatment for chronic left sided low back pain with left radicular symptoms. Evaluation was limited secondary to time constraint and high irritability of patient's pain with movement. She does demonstrate high level of guarding but also with increased left sided muscle tension throughout paraspinals. She does seem to have left sided radicular symptoms but she was unable to tolerate trial of repeated movements or neurodynamic techniques. Patient provided HEP to introduced light lumbar movement and core activation.     GOALS: Goals reviewed with patient? Yes   SHORT TERM GOALS:   STG Name Target Date Goal status  1 Patient will be I with initial HEP in order to progress with therapy. Baseline:  08/28/2021 INITIAL  2 PT will review FOTO with patient by 3rd visit in order to understand expected progress and outcome with therapy. Baseline:  08/21/2021 INITIAL  3 Patient will report pain level </= 4/10 in order to reduce functional limitations. Baseline: 08/28/2021 INITIAL    LONG TERM GOALS:    LTG Name Target Date Goal status  1 Patient will be I with final HEP to maintain progress from PT. Baseline: 09/25/2021 INITIAL  2 Patient will report >/= 57% status on FOTO to indicate improved functional ability. Baseline: 43% functional status 09/25/2021 INITIAL  3 Patient will report pain level </= 2/10 with sitting at work to improve her ability to perform work related tasks Baseline: patient reports pain that increased at work with sitting 09/25/2021 INITIAL  4 Patient will demonstrate >/= 25% improvement in lumbar motion in all planes without an increase in pain in order to improve ability to perform household tasks Baseline: 09/25/2021 INITIAL      PLAN: PT FREQUENCY: 1x/week   PT DURATION: 8 weeks   PLANNED INTERVENTIONS: Therapeutic exercises, Therapeutic activity, Neuro Muscular re-education, Balance training, Gait  training, Patient/Family education, Joint mobilization, Aquatic Therapy, Dry Needling, Electrical stimulation, Spinal mobilization, Cryotherapy, Moist heat, and Manual therapy   PLAN FOR NEXT SESSION: Review HEP and progress PRN, manual/dry needling for left lumbar paraspinals or gluteal region, trial repeated movements to assess directional preference, gentle lumbar motion and core activation    Rosana Hoes, PT, DPT, LAT, ATC 08/12/21  1:37 PM Phone: [phone] Fax: [phone]

## 2021-08-13 ENCOUNTER — Telehealth: Payer: Self-pay | Admitting: Physical Therapy

## 2021-08-13 NOTE — Telephone Encounter (Signed)
Attempted to contact patient due to missed PT appointment. Left VM advising patient of missed appointment and reminder of attendance policy, this was last appointment so she will need to call to schedule future appointments.   Rosana Hoes, PT, DPT, LAT, ATC 08/13/21  12:01 PM Phone: [phone] Fax: [phone]

## 2021-08-14 ENCOUNTER — Institutional Professional Consult (permissible substitution): Payer: BC Managed Care – PPO | Admitting: Behavioral Health

## 2021-08-19 ENCOUNTER — Other Ambulatory Visit (HOSPITAL_COMMUNITY): Payer: Self-pay

## 2021-08-22 ENCOUNTER — Other Ambulatory Visit (HOSPITAL_COMMUNITY): Payer: Self-pay

## 2021-08-23 ENCOUNTER — Other Ambulatory Visit (HOSPITAL_COMMUNITY): Payer: Self-pay

## 2021-08-26 ENCOUNTER — Other Ambulatory Visit (HOSPITAL_COMMUNITY): Payer: Self-pay

## 2021-09-09 DIAGNOSIS — F331 Major depressive disorder, recurrent, moderate: Secondary | ICD-10-CM | POA: Diagnosis not present

## 2021-09-09 DIAGNOSIS — F4310 Post-traumatic stress disorder, unspecified: Secondary | ICD-10-CM | POA: Diagnosis not present

## 2021-09-09 DIAGNOSIS — F411 Generalized anxiety disorder: Secondary | ICD-10-CM | POA: Diagnosis not present

## 2021-09-19 ENCOUNTER — Other Ambulatory Visit (HOSPITAL_COMMUNITY): Payer: Self-pay

## 2021-09-19 DIAGNOSIS — F411 Generalized anxiety disorder: Secondary | ICD-10-CM | POA: Diagnosis not present

## 2021-09-19 DIAGNOSIS — F331 Major depressive disorder, recurrent, moderate: Secondary | ICD-10-CM | POA: Diagnosis not present

## 2021-09-19 MED ORDER — LORAZEPAM 0.5 MG PO TABS
0.5000 mg | ORAL_TABLET | Freq: Two times a day (BID) | ORAL | 0 refills | Status: DC | PRN
  Filled 2021-09-19: qty 40, 30d supply, fill #0
  Filled 2021-10-24: qty 40, 20d supply, fill #0

## 2021-09-19 MED ORDER — ESCITALOPRAM OXALATE 10 MG PO TABS
ORAL_TABLET | ORAL | 0 refills | Status: DC
  Filled 2021-09-19 – 2021-10-24 (×3): qty 30, 30d supply, fill #0

## 2021-09-19 MED ORDER — BUSPIRONE HCL 15 MG PO TABS
15.0000 mg | ORAL_TABLET | Freq: Three times a day (TID) | ORAL | 0 refills | Status: DC
  Filled 2021-09-19 – 2021-10-24 (×3): qty 90, 30d supply, fill #0

## 2021-09-19 MED ORDER — TRAZODONE HCL 50 MG PO TABS
50.0000 mg | ORAL_TABLET | Freq: Every evening | ORAL | 0 refills | Status: DC | PRN
  Filled 2021-09-19 – 2021-10-24 (×3): qty 60, 30d supply, fill #0

## 2021-09-27 ENCOUNTER — Other Ambulatory Visit (HOSPITAL_COMMUNITY): Payer: Self-pay

## 2021-10-01 DIAGNOSIS — F331 Major depressive disorder, recurrent, moderate: Secondary | ICD-10-CM | POA: Diagnosis not present

## 2021-10-01 DIAGNOSIS — F4310 Post-traumatic stress disorder, unspecified: Secondary | ICD-10-CM | POA: Diagnosis not present

## 2021-10-01 DIAGNOSIS — F411 Generalized anxiety disorder: Secondary | ICD-10-CM | POA: Diagnosis not present

## 2021-10-08 DIAGNOSIS — F331 Major depressive disorder, recurrent, moderate: Secondary | ICD-10-CM | POA: Diagnosis not present

## 2021-10-08 DIAGNOSIS — F411 Generalized anxiety disorder: Secondary | ICD-10-CM | POA: Diagnosis not present

## 2021-10-09 IMAGING — CT CT ABD-PELV W/O CM
2 of 4 series · 17 of 46 positions shown, 19 images · non-contrast
Comparison: 12/11/2017

CLINICAL DATA: Abdominal distension, epigastric pain, vomiting

EXAM:
CT ABDOMEN AND PELVIS WITHOUT CONTRAST
TECHNIQUE: Multidetector CT imaging of the abdomen and pelvis was performed
following the standard protocol without IV contrast.

[Series 2: axial st · axial · 0.98mm/px · z∈[+1084,+1520]mm · 14 of 99 slices shown, 16 images]
[im 6/99  soft-tissue]
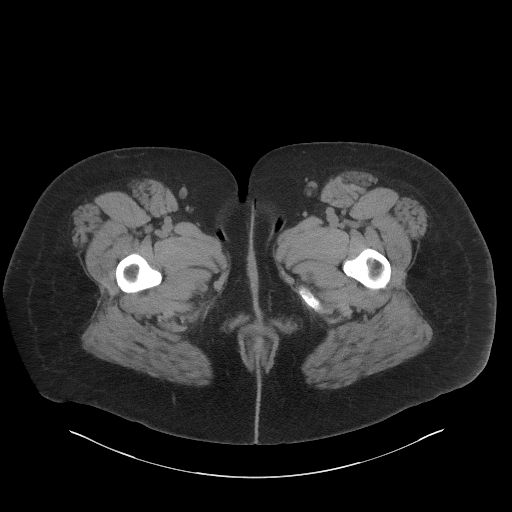
[im 6/99  bone]
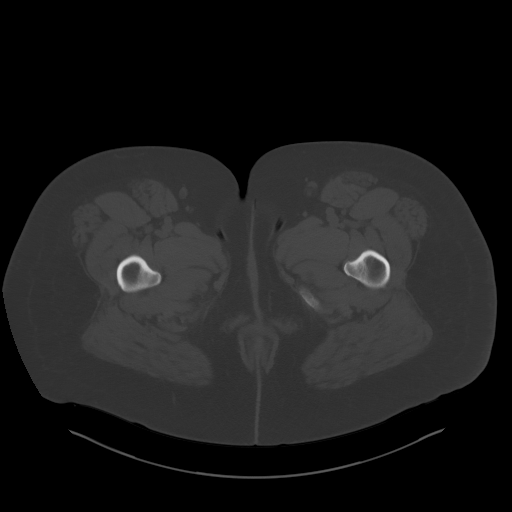
[im 11/99  soft-tissue]
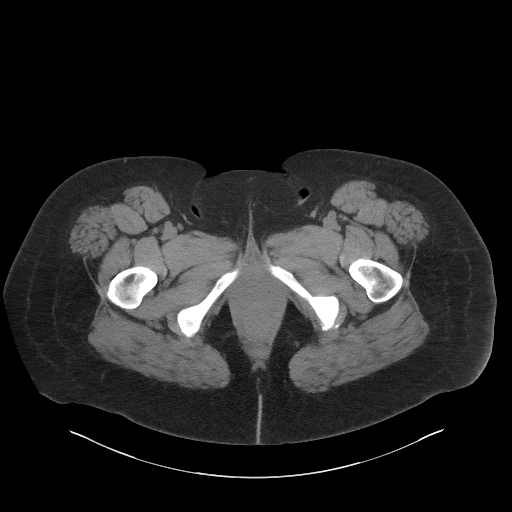
[im 22/99  soft-tissue]
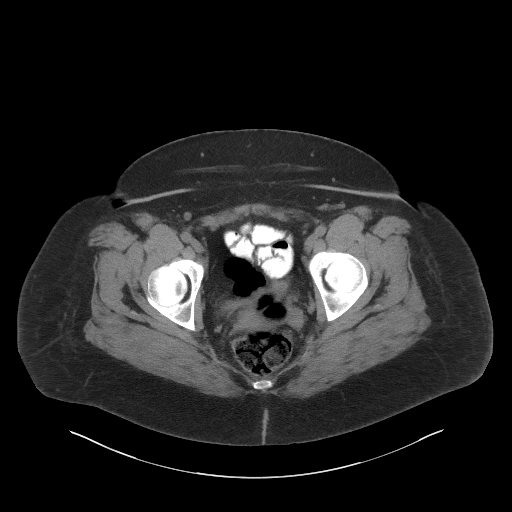
[im 28/99  soft-tissue]
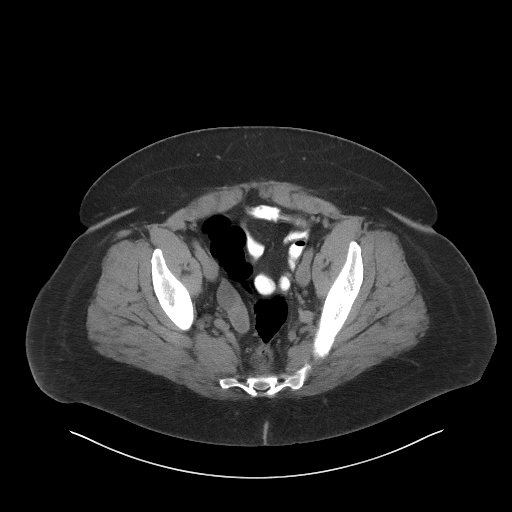
[im 33/99  soft-tissue]
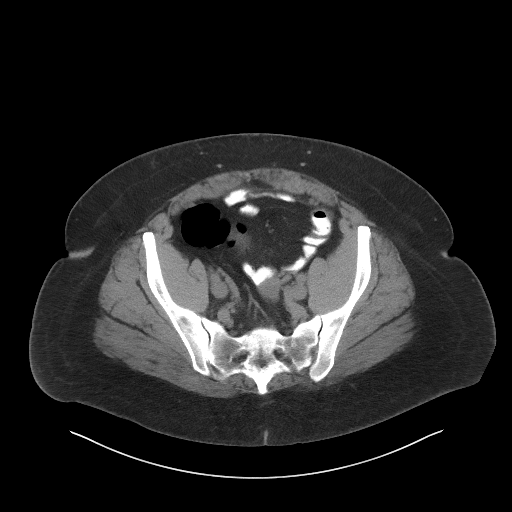
[im 39/99  soft-tissue]
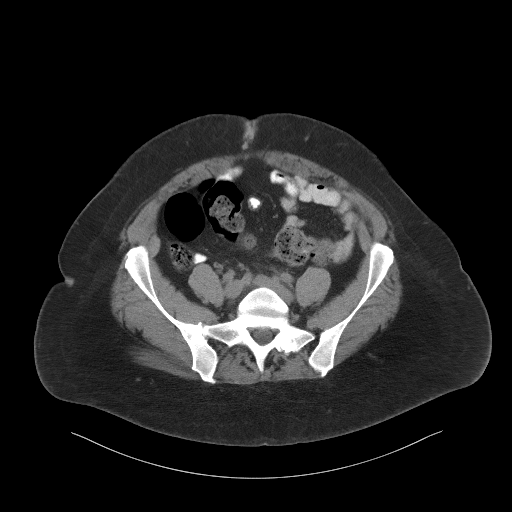
[im 44/99  soft-tissue]
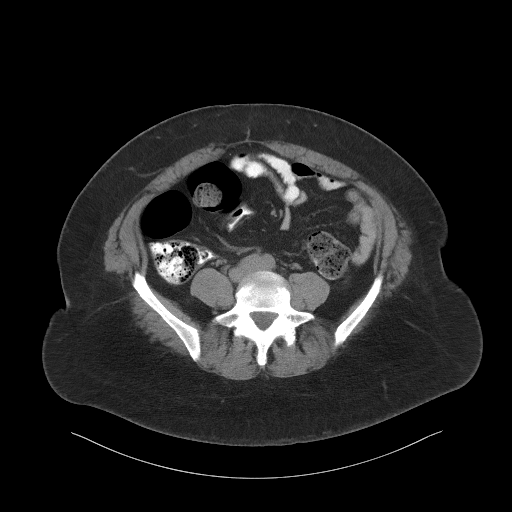
[im 55/99  soft-tissue]
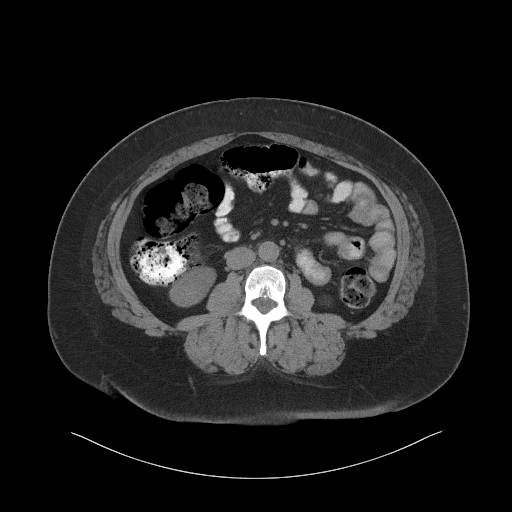
[im 60/99  soft-tissue]
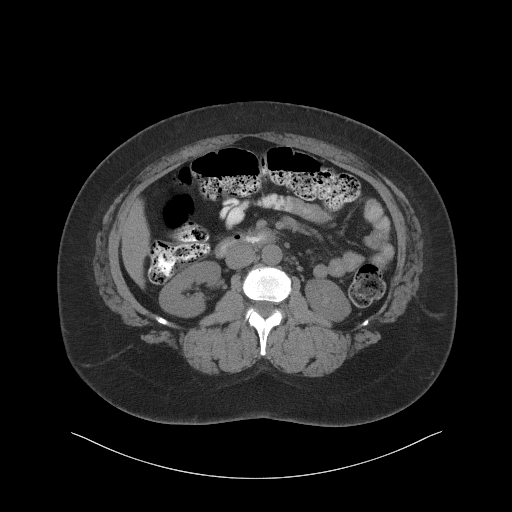
[im 60/99  bone]
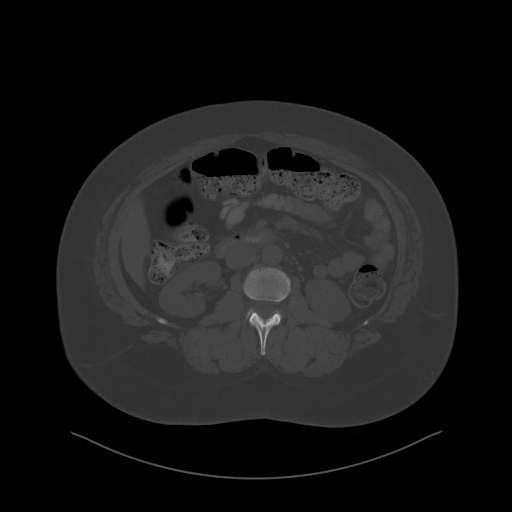
[im 66/99  soft-tissue]
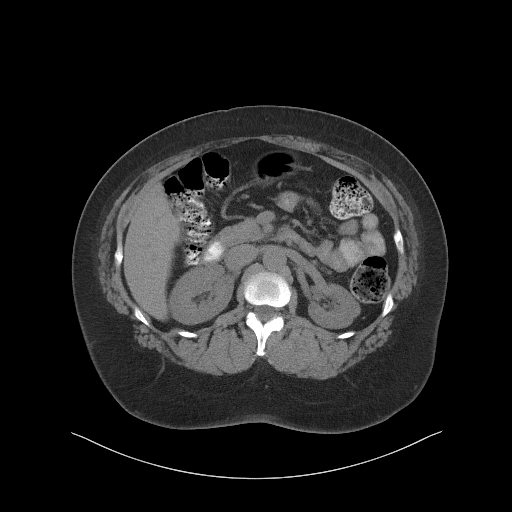
[im 71/99  soft-tissue]
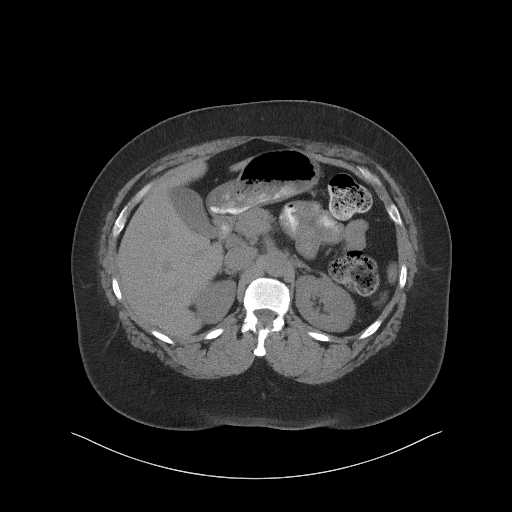
[im 77/99  soft-tissue]
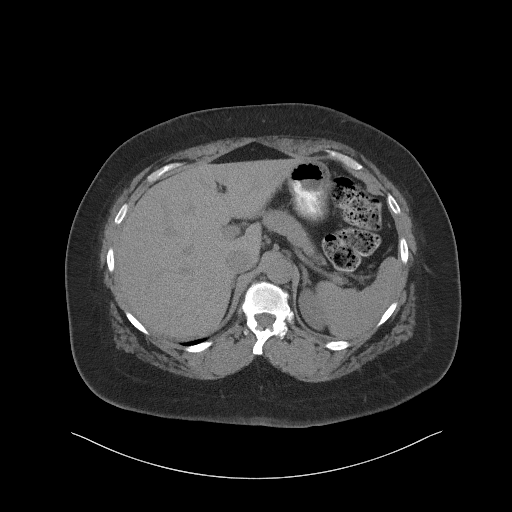
[im 88/99  soft-tissue]
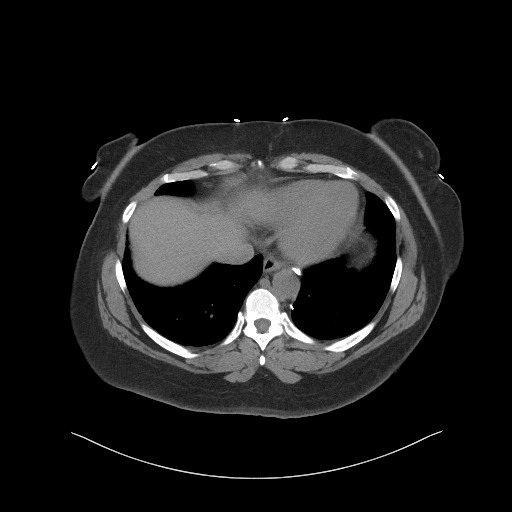
[im 93/99  soft-tissue]
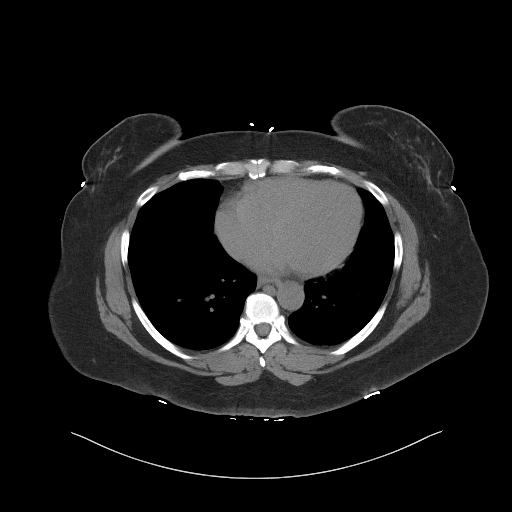

[Series 5: coronal st · coronal · 0.99mm/px · 3 of 127 slices shown]
[im 43/127  soft-tissue]
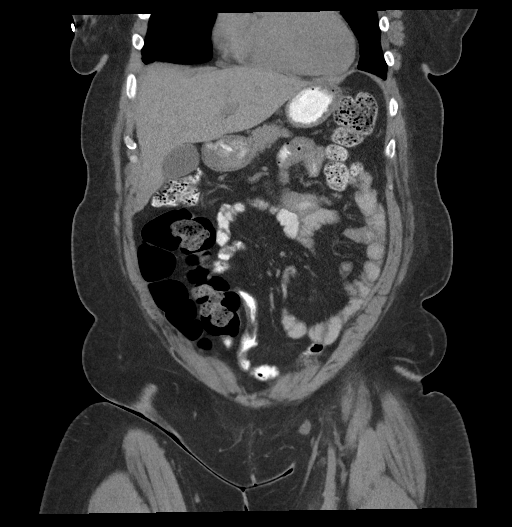
[im 57/127  soft-tissue]
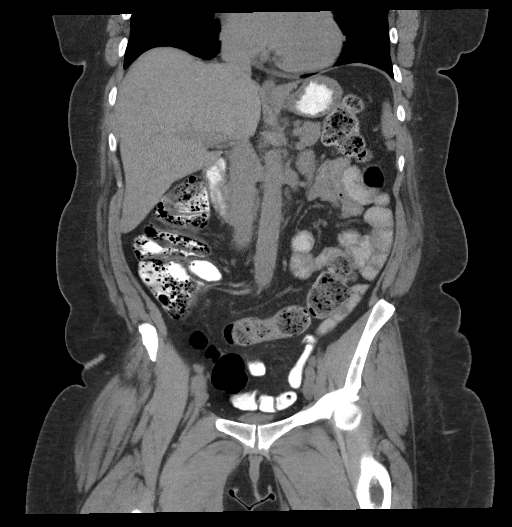
[im 71/127  soft-tissue]
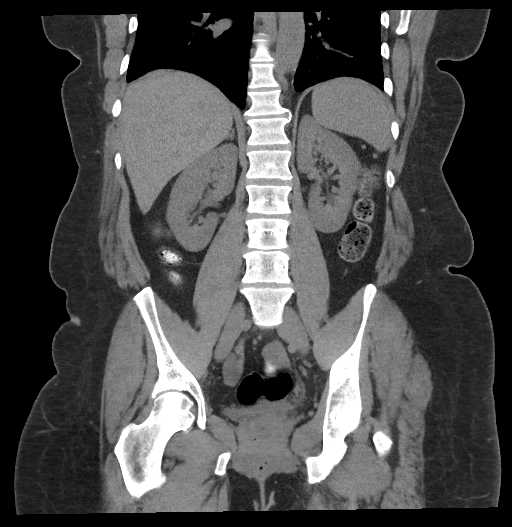

[17 of 46 positions shown; findings below may reference images not displayed]

FINDINGS: Lower chest: No pleural or pericardial effusion. Stable 4 mm
subpleural nodule, lateral basal segment right lower lobe.

Hepatobiliary: No focal liver abnormality is seen. No gallstones,
gallbladder wall thickening, or biliary dilatation.

Pancreas: Unremarkable. No pancreatic ductal dilatation or
surrounding inflammatory changes.

Spleen: Normal in size.  Stable calcified granulomas.

Adrenals/Urinary Tract: Adrenal glands unremarkable. No
hydronephrosis or urolithiasis. Urinary bladder is nondistended

Stomach/Bowel: Stomach is partially distended by ingested material.
The small bowel is nondistended. Appendix not discretely identified.
No pericecal inflammatory/edematous change. Moderate colonic fecal
material without dilatation or inflammatory change.

Vascular/Lymphatic: Minimal aortoiliac calcified plaque. No
aneurysm. No abdominal or pelvic adenopathy.

Reproductive: Status post hysterectomy. No adnexal masses.

Other: No ascites.  No free air.

Musculoskeletal: Lower lumbar facet DJD. Negative for fracture or
worrisome bone lesion.
IMPRESSION: 1. No acute findings.
2.  Aortic Atherosclerosis ([id number]-170.0).

## 2021-10-16 DIAGNOSIS — F331 Major depressive disorder, recurrent, moderate: Secondary | ICD-10-CM | POA: Diagnosis not present

## 2021-10-16 DIAGNOSIS — F411 Generalized anxiety disorder: Secondary | ICD-10-CM | POA: Diagnosis not present

## 2021-10-16 DIAGNOSIS — F4310 Post-traumatic stress disorder, unspecified: Secondary | ICD-10-CM | POA: Diagnosis not present

## 2021-10-24 ENCOUNTER — Other Ambulatory Visit (HOSPITAL_COMMUNITY): Payer: Self-pay

## 2021-10-24 ENCOUNTER — Other Ambulatory Visit: Payer: Self-pay

## 2021-10-24 ENCOUNTER — Other Ambulatory Visit (HOSPITAL_COMMUNITY)
Admission: RE | Admit: 2021-10-24 | Discharge: 2021-10-24 | Disposition: A | Discharge status: Home / Self Care | Payer: BC Managed Care – PPO | Source: Ambulatory Visit | Attending: Internal Medicine | Admitting: Internal Medicine

## 2021-10-24 ENCOUNTER — Encounter: Payer: Self-pay | Admitting: Internal Medicine

## 2021-10-24 ENCOUNTER — Ambulatory Visit (INDEPENDENT_AMBULATORY_CARE_PROVIDER_SITE_OTHER): Payer: BC Managed Care – PPO | Admitting: Internal Medicine

## 2021-10-24 VITALS — BP 133/89 | HR 66 | Temp 98.3°F | Ht 66.0 in | Wt 266.6 lb

## 2021-10-24 DIAGNOSIS — I10 Essential (primary) hypertension: Secondary | ICD-10-CM

## 2021-10-24 DIAGNOSIS — N898 Other specified noninflammatory disorders of vagina: Secondary | ICD-10-CM | POA: Insufficient documentation

## 2021-10-24 DIAGNOSIS — Z113 Encounter for screening for infections with a predominantly sexual mode of transmission: Secondary | ICD-10-CM

## 2021-10-24 DIAGNOSIS — R5383 Other fatigue: Secondary | ICD-10-CM

## 2021-10-24 DIAGNOSIS — Z6841 Body Mass Index (BMI) 40.0 and over, adult: Secondary | ICD-10-CM

## 2021-10-24 MED ORDER — LIRAGLUTIDE 18 MG/3ML ~~LOC~~ SOPN
3.0000 mg | PEN_INJECTOR | Freq: Every day | SUBCUTANEOUS | 3 refills | Status: DC
  Filled 2021-10-24: qty 9, 18d supply, fill #0

## 2021-10-24 MED ORDER — SAXENDA 18 MG/3ML ~~LOC~~ SOPN
3.0000 mg | PEN_INJECTOR | Freq: Every day | SUBCUTANEOUS | 0 refills | Status: AC
  Filled 2021-10-24 – 2021-11-25 (×6): qty 45, 90d supply, fill #0
  Filled 2021-11-26: qty 15, 30d supply, fill #0

## 2021-10-24 NOTE — Assessment & Plan Note (Addendum)
Patient noted for the last several weeks she has been noticing increased fatigue.  She has 2 bruises on her left leg, noting easy bruising.  She has a history of iron deficiency anemia which since resolved with iron supplementation years ago.  Since the removal of her uterus, her hemoglobin levels have been steady. Last CBC was obtained in 2021 and blood counts were within normal limits.  She has a family history of cancers.  She denies lightheadedness, dizziness, chest pain, shortness of breath or syncopal episodes.  She currently does not take any over-the-counter supplements.  She denies any active bleeding, no hematuria, no hematochezia, no hematemesis. ? ? ?P: ?-CBC with differential ordered ?

## 2021-10-24 NOTE — Assessment & Plan Note (Addendum)
Home medications include metoprolol 25 mg twice daily daily.  Patient states she is tolerating medication well.  Blood pressure at goal 133/89.  She denies chest pain, shortness of breath, headache, lightheadedness or dizziness.  Denies any adverse effects from the medication. ? ? ?P: ?-Continue current regimen ?-BMP ordered today ?

## 2021-10-24 NOTE — Progress Notes (Signed)
? ?CC: STI screening, fatigue and easy bruising ? ?HPI: ? ?[name] is a 46 y.o. female with a past medical history stated below and presents today for CC listed above. Please see problem based assessment and plan for additional details. ? ?Past Medical History:  ?Diagnosis Date  ? Asthma   ? Hypertension   ? Migraines   ? Obesity   ? Panic attacks   ? Peritonitis ([facility]) 2014  ? Pneumonia   ? ? ?Current Outpatient Medications on File Prior to Visit  ?Medication Sig Dispense Refill  ? albuterol (PROVENTIL) (2.5 MG/3ML) 0.083% nebulizer solution Take 3 mLs (2.5 mg total) by nebulization every 6 (six) hours as needed for wheezing or shortness of breath. 75 mL 12  ? albuterol (VENTOLIN HFA) 108 (90 Base) MCG/ACT inhaler Inhale 1-2 puffs into the lungs every 6 (six) hours as needed for wheezing or shortness of breath. 8.5 g 6  ? Budeson-Glycopyrrol-Formoterol (BREZTRI AEROSPHERE) 160-9-4.8 MCG/ACT AERO Inhale 2 puffs into the lungs in the morning and at bedtime. 10.7 g 11  ? busPIRone (BUSPAR) 15 MG tablet Take 1 tablet (15 mg total) by mouth 3 (three) times daily with food. 90 tablet 0  ? busPIRone (BUSPAR) 15 MG tablet Take 1 tablet (15 mg total) by mouth 3 (three) times daily with food 90 tablet 0  ? citalopram (CELEXA) 40 MG tablet Take 1 tablet (40 mg total) by mouth daily. 30 tablet 0  ? cyclobenzaprine (FLEXERIL) 5 MG tablet Take 1 tablet (5 mg total) by mouth 3 (three) times daily as needed for muscle spasms. 30 tablet 0  ? diclofenac (CATAFLAM) 50 MG tablet Take 1 tablet (50 mg total) by mouth 3 (three) times daily. 90 tablet 3  ? escitalopram (LEXAPRO) 10 MG tablet Take 1/2 tablet by mouth for 7 days then 1 tablet every day 30 tablet 0  ? fluticasone (FLONASE) 50 MCG/ACT nasal spray Place 1-2 sprays into both nostrils daily. 16 g 5  ? Fremanezumab-vfrm (AJOVY) 225 MG/1.5ML SOSY INJECT 225 MG INTO THE SKIN EVERY 30 DAYS. 1.5 mL 4  ? Insulin Pen Needle 31G X 5 MM MISC Use as directed with victoza 100 each  0  ? levocetirizine (XYZAL) 5 MG tablet Take 1 tablet (5 mg total) by mouth in the morning. 30 tablet 3  ? LORazepam (ATIVAN) 0.5 MG tablet Take 1 tablet (0.5 mg total) by mouth 2 (two) times daily as needed for severe anxiety 40 tablet 0  ? LORazepam (ATIVAN) 0.5 MG tablet Take 1 tablet (0.5 mg total) by mouth 2 (two) times daily as needed for severe anxiety (this is a 1 month supply) 40 tablet 0  ? metoprolol succinate (TOPROL-XL) 25 MG 24 hr tablet Take 1 tablet (25 mg total) by mouth daily. 30 tablet 2  ? montelukast (SINGULAIR) 10 MG tablet TAKE 1 TABLET (10 MG TOTAL) BY MOUTH AT BEDTIME. 30 tablet 3  ? omeprazole (PRILOSEC) 20 MG capsule TAKE 1 CAPSULE (20 MG TOTAL) BY MOUTH DAILY. 30 capsule 3  ? phenol (CHLORASEPTIC) 1.4 % LIQD Use as directed 1 spray in the mouth or throat as needed for throat irritation / pain. 177 mL 1  ? prazosin (MINIPRESS) 2 MG capsule Take 1 capsule (2 mg total) by mouth at bedtime. 30 capsule 0  ? topiramate (TOPAMAX) 50 MG tablet TAKE 2 TABLETS BY MOUTH TWO TIMES DAILY 120 tablet 3  ? traZODone (DESYREL) 50 MG tablet Take 1-2 tablets (50-100 mg total) by mouth at bedtime  as needed for sleep 60 tablet 0  ? ?No current facility-administered medications on file prior to visit.  ? ? ?Family History  ?Problem Relation Age of Onset  ? Hypertension Mother   ? Hypertension Father   ? Breast cancer Cousin   ? Ovarian cancer Paternal Grandmother   ? ? ?Social History  ? ?Socioeconomic History  ? Marital status: Single  ?  Spouse name: Not on file  ? Number of children: 2  ? Years of education: 74  ? Highest education level: Not on file  ?Occupational History  ? Occupation: collections and recovery agent  ?Tobacco Use  ? Smoking status: Former  ?  Packs/day: 1.00  ?  Years: 10.00  ?  Pack years: 10.00  ?  Types: Cigarettes  ? Smokeless tobacco: Never  ? Tobacco comments:  ?  has quit smoking as of 5 years ago  ?Vaping Use  ? Vaping Use: Former  ? Devices: just tried when first came out not a  regular user  ?Substance and Sexual Activity  ? Alcohol use: Not Currently  ? Drug use: No  ? Sexual activity: Yes  ?  Partners: Female  ?  Birth control/protection: Surgical  ?Other Topics Concern  ? Not on file  ?Social History Narrative  ? Lives alone.  ? Right-handed.  ? 1 cup caffeine some days.  ? ?Social Determinants of Health  ? ?Financial Resource Strain: Not on file  ?Food Insecurity: Not on file  ?Transportation Needs: Not on file  ?Physical Activity: Not on file  ?Stress: Not on file  ?Social Connections: Not on file  ?Intimate Partner Violence: Not on file  ? ? ?Review of Systems: ?ROS negative except for what is noted on the assessment and plan. ? ?Vitals:  ? 10/24/21 1050  ?BP: 133/89  ?Pulse: 66  ?Temp: 98.3 ?F (36.8 ?C)  ?TempSrc: Oral  ?SpO2: 95%  ?Weight: 266 lb 9.6 oz (120.9 kg)  ?Height: 5\' 6"  (1.676 m)  ? ? ? ?Physical Exam: ?Constitutional: well-appearing obese woman sitting in the chair, in no acute distress ?HENT: normocephalic atraumatic, mucous membranes moist ?Eyes: conjunctiva non-erythematous ?Neck: supple ?Cardiovascular: regular rate and rhythm, no m/r/g ?Pulmonary/Chest: normal work of breathing on room air, lungs clear to auscultation bilaterally ?Abdominal: soft, non-tender, non-distended ?MSK: normal bulk and tone ?Neurological: alert & oriented x 3, 5/5 strength in bilateral upper and lower extremities, normal gait ?Skin: warm and dry ?Psych: Normal mood, normal behavior ? ? ?Assessment & Plan:  ? ?See Encounters Tab for problem based charting. ? ?Patient discussed with Dr. Saverio Danker ?Timothy Lasso, M.D. ?[facility] Internal Medicine, PGY-1 ?Pager: [phone], Phone: [phone] ?Date 10/24/2021 Time 12:49 PM  ?

## 2021-10-24 NOTE — Assessment & Plan Note (Addendum)
Medication includes Victoza 1.8 mg daily.  She states that she has not seen much results from the use of Victoza.  Denies any adverse effects.  She still continues to have a poor diet and lack of exercise.  She would like to start Ozempic.  Barriers to initiating Ozempic includes the fact that she does not have a diagnosis for diabetes.  Her A1c was last assessed several years ago which was within normal limits.  An alternative could be [place], pending insurance prior authorization.  There is still room to titrate up Victoza.  Which patient is agreeable to increasing the dose of Victoza.  If she is not noticing weight loss, can consider Wegovy as an alternative.  An A1c obtained at today's visit, pending results, if she is diabetic then she would qualify for Ozempic.  Hopefully she has not developed diabetes.  She was counseled extensively on the importance of diet and exercise.  She is agreeable to meet with dietitian regarding education about better food choices options. ? ?P: ?-Referral to dietitian ?-A1c pending ?-Victoza 1.8 mg increased to 2.4mg   ?

## 2021-10-24 NOTE — Assessment & Plan Note (Signed)
Patient states for the last 2 weeks she has been experiencing vaginal odor, fishy scent.  Denies discharge or dysuria.  She has had unprotected sex with 1 female partner during this time..  She is concerned that she may have BV or possible STI.  She had a hysterectomy several years ago, no concern for pregnancy.  Patient prefers self collected vaginal swab.  HIV and RPR was assessed last year and they were nonreactive.  Will obtain HIV testing as well. ? ?P: ?-STI screening obtained today, will call patient with results ?

## 2021-10-24 NOTE — Patient Instructions (Addendum)
Thank you, Ms.Tomma Lightning for allowing [place] to provide your care today. Today we discussed STI screening, weight loss control and possible anemia.   ? ?I have ordered the following labs for you: ? ?Lab Orders    ?     BMP8+Anion Gap    ?     CBC with Diff    ?     Hemoglobin A1c    ?     HIV antibody (with reflex)    ?  ? ?Tests ordered today: ? ?STI self swab ? ?Referrals ordered today:  ? ?Consult to dietitian ? ? ?Follow up: 6 months  ? ?Remember: Continue taking your medications as directed.  I will call you with your lab results.  I will increase your Victoza to 3 mg weekly, after checking your A1c depending on those levels we can pursue starting Wegovy (same as Ozempic). ? ?Should you have any questions or concerns please call the internal medicine clinic at [phone].   ? ?Timothy Lasso, MD ?[facility] ? ? ?

## 2021-10-25 ENCOUNTER — Telehealth: Payer: Self-pay | Admitting: Internal Medicine

## 2021-10-25 LAB — BMP8+ANION GAP
Anion Gap: 14 mmol/L (ref 10.0–18.0)
BUN/Creatinine Ratio: 18 (ref 9–23)
BUN: 17 mg/dL (ref 6–24)
CO2: 21 mmol/L (ref 20–29)
Calcium: 8.9 mg/dL (ref 8.7–10.2)
Chloride: 104 mmol/L (ref 96–106)
Creatinine, Ser: 0.96 mg/dL (ref 0.57–1.00)
Glucose: 91 mg/dL (ref 70–99)
Potassium: 4.4 mmol/L (ref 3.5–5.2)
Sodium: 139 mmol/L (ref 134–144)
eGFR: 73 mL/min/{1.73_m2} (ref >59)

## 2021-10-25 LAB — CBC WITH DIFFERENTIAL/PLATELET
Basophils Absolute: 0 10*3/uL (ref 0.0–0.2)
Basos: 1 %
EOS (ABSOLUTE): 0.1 10*3/uL (ref 0.0–0.4)
Eos: 2 %
Hematocrit: 38.3 % (ref 34.0–46.6)
Hemoglobin: 12.6 g/dL (ref 11.1–15.9)
Immature Grans (Abs): 0 10*3/uL (ref 0.0–0.1)
Immature Granulocytes: 0 %
Lymphocytes Absolute: 1.6 10*3/uL (ref 0.7–3.1)
Lymphs: 25 %
MCH: 29.9 pg (ref 26.6–33.0)
MCHC: 32.9 g/dL (ref 31.5–35.7)
MCV: 91 fL (ref 79–97)
Monocytes Absolute: 0.6 10*3/uL (ref 0.1–0.9)
Monocytes: 10 %
Neutrophils Absolute: 4.1 10*3/uL (ref 1.4–7.0)
Neutrophils: 62 %
Platelets: 217 10*3/uL (ref 150–450)
RBC: 4.22 x10E6/uL (ref 3.77–5.28)
RDW: 13 % (ref 11.7–15.4)
WBC: 6.6 10*3/uL (ref 3.4–10.8)

## 2021-10-25 LAB — HEMOGLOBIN A1C
Est. average glucose Bld gHb Est-mCnc: 94 mg/dL
Hgb A1c MFr Bld: 4.9 % (ref 4.8–5.6)

## 2021-10-25 LAB — CERVICOVAGINAL ANCILLARY ONLY
Bacterial Vaginitis (gardnerella): NEGATIVE
Chlamydia: NEGATIVE
Comment: NEGATIVE
Comment: NEGATIVE
Comment: NEGATIVE
Comment: NORMAL
Neisseria Gonorrhea: NEGATIVE
Trichomonas: NEGATIVE

## 2021-10-25 LAB — HIV ANTIBODY (ROUTINE TESTING W REFLEX): HIV Screen 4th Generation wRfx: NONREACTIVE

## 2021-10-25 NOTE — Telephone Encounter (Signed)
Informed to increase Victoza to 2.4mg  instead of 3mg  to avoid GI upset. I informed her to call the office if she has additional questions. ?

## 2021-10-28 ENCOUNTER — Other Ambulatory Visit (HOSPITAL_COMMUNITY): Payer: Self-pay

## 2021-10-28 NOTE — Progress Notes (Signed)
Internal Medicine Clinic Attending ? ?Case discussed with Dr. Ruben Im  At the time of the visit.  We reviewed the resident?s history and exam and pertinent patient test results.  I agree with the assessment, diagnosis, and plan of care documented in the resident?s note. We discussed uptitration of GLP1-RA for improved weight loss effect. Plan to increase to 2.4 mg for one week and then increase to 3 mg thereafter if tolerated. Dr. Ruben Im to discuss this plan with Stacey Price. ?

## 2021-10-29 DIAGNOSIS — F331 Major depressive disorder, recurrent, moderate: Secondary | ICD-10-CM | POA: Diagnosis not present

## 2021-10-29 DIAGNOSIS — F411 Generalized anxiety disorder: Secondary | ICD-10-CM | POA: Diagnosis not present

## 2021-10-31 ENCOUNTER — Telehealth: Payer: Self-pay

## 2021-10-31 ENCOUNTER — Other Ambulatory Visit (HOSPITAL_COMMUNITY): Payer: Self-pay

## 2021-10-31 NOTE — Telephone Encounter (Signed)
Pa for pt ( AJOVY 225 MG /1.5 ML )  came through on cover my meds was submitted ..  (pt has not been see in office for migraines in a while so no notes were submitted ) .[name]..awaiting approval or denial ? ? ?  ?

## 2021-11-01 NOTE — Telephone Encounter (Signed)
DECISION: ? ? ? ?DENIED  ? ? ? ? ?Why your request was denied: ?You do not meet the requirements of your plan. ?Your plan covers this drug when you are not using it with another CGRP receptor antagonist. ?Your request has been denied based on the information we have. ? ?

## 2021-11-02 ENCOUNTER — Other Ambulatory Visit (HOSPITAL_COMMUNITY): Payer: Self-pay

## 2021-11-06 ENCOUNTER — Other Ambulatory Visit (HOSPITAL_COMMUNITY): Payer: Self-pay

## 2021-11-07 DIAGNOSIS — F4310 Post-traumatic stress disorder, unspecified: Secondary | ICD-10-CM | POA: Diagnosis not present

## 2021-11-07 DIAGNOSIS — F411 Generalized anxiety disorder: Secondary | ICD-10-CM | POA: Diagnosis not present

## 2021-11-07 DIAGNOSIS — F331 Major depressive disorder, recurrent, moderate: Secondary | ICD-10-CM | POA: Diagnosis not present

## 2021-11-08 ENCOUNTER — Telehealth: Payer: Self-pay

## 2021-11-08 ENCOUNTER — Other Ambulatory Visit (HOSPITAL_COMMUNITY): Payer: Self-pay

## 2021-11-08 NOTE — Telephone Encounter (Signed)
45 mL sent on 10/24/21. Spoke with Lurena Joiner at [facility] who states it is requiring a PA. Will forward to PA Staff.

## 2021-11-08 NOTE — Telephone Encounter (Signed)
Liraglutide -Weight Management (SAXENDA) 18 MG/3ML SOPN, refill request @ [facility].

## 2021-11-11 ENCOUNTER — Other Ambulatory Visit (HOSPITAL_COMMUNITY): Payer: Self-pay

## 2021-11-13 ENCOUNTER — Institutional Professional Consult (permissible substitution): Payer: BC Managed Care – PPO | Admitting: Behavioral Health

## 2021-11-13 ENCOUNTER — Other Ambulatory Visit (HOSPITAL_COMMUNITY): Payer: Self-pay

## 2021-11-14 ENCOUNTER — Other Ambulatory Visit (HOSPITAL_COMMUNITY): Payer: Self-pay

## 2021-11-21 DIAGNOSIS — F411 Generalized anxiety disorder: Secondary | ICD-10-CM | POA: Diagnosis not present

## 2021-11-21 DIAGNOSIS — F331 Major depressive disorder, recurrent, moderate: Secondary | ICD-10-CM | POA: Diagnosis not present

## 2021-11-21 DIAGNOSIS — F4310 Post-traumatic stress disorder, unspecified: Secondary | ICD-10-CM | POA: Diagnosis not present

## 2021-11-22 ENCOUNTER — Other Ambulatory Visit (HOSPITAL_COMMUNITY): Payer: Self-pay

## 2021-11-25 ENCOUNTER — Telehealth: Payer: Self-pay

## 2021-11-25 ENCOUNTER — Other Ambulatory Visit (HOSPITAL_COMMUNITY): Payer: Self-pay

## 2021-11-25 NOTE — Telephone Encounter (Signed)
PA form for pt [facility] ) came through on cover my meds was started awaiting   for the second half questions for pt ... usually take 5-10 mins but can take longer .. will be checking back in

## 2021-11-26 ENCOUNTER — Other Ambulatory Visit (HOSPITAL_COMMUNITY): Payer: Self-pay

## 2021-11-26 DIAGNOSIS — F331 Major depressive disorder, recurrent, moderate: Secondary | ICD-10-CM | POA: Diagnosis not present

## 2021-11-26 DIAGNOSIS — F411 Generalized anxiety disorder: Secondary | ICD-10-CM | POA: Diagnosis not present

## 2021-11-26 NOTE — Telephone Encounter (Signed)
SECOND HALF  : question were completed and  notes sent off awaiting approval or denial ..[name]

## 2021-11-26 NOTE — Telephone Encounter (Signed)
DECISION :    APPROVED    As long as you remain covered by your prescription drug plan and three are no changes to your plan benefits... this request is a approved from 11/26/2021 to 10 11/2021...        ( COPY  SENT  TO  PHARMACY  ALSO )

## 2021-11-29 ENCOUNTER — Other Ambulatory Visit (HOSPITAL_COMMUNITY): Payer: Self-pay

## 2021-11-29 MED ORDER — LORAZEPAM 0.5 MG PO TABS
0.5000 mg | ORAL_TABLET | Freq: Two times a day (BID) | ORAL | 0 refills | Status: DC | PRN
  Filled 2021-11-29: qty 40, 30d supply, fill #0

## 2021-11-29 MED ORDER — ESCITALOPRAM OXALATE 10 MG PO TABS
10.0000 mg | ORAL_TABLET | Freq: Every day | ORAL | 0 refills | Status: DC
  Filled 2021-11-29: qty 30, 30d supply, fill #0

## 2021-11-29 MED ORDER — TRAZODONE HCL 50 MG PO TABS
50.0000 mg | ORAL_TABLET | Freq: Every evening | ORAL | 0 refills | Status: DC | PRN
Start: 2021-11-29 — End: 2021-12-27
  Filled 2021-11-29: qty 60, 30d supply, fill #0

## 2021-11-29 MED ORDER — BUSPIRONE HCL 15 MG PO TABS
15.0000 mg | ORAL_TABLET | Freq: Three times a day (TID) | ORAL | 0 refills | Status: DC
  Filled 2021-11-29: qty 90, 30d supply, fill #0

## 2021-11-30 ENCOUNTER — Other Ambulatory Visit (HOSPITAL_COMMUNITY): Payer: Self-pay

## 2021-12-02 ENCOUNTER — Telehealth: Payer: Self-pay

## 2021-12-02 DIAGNOSIS — F411 Generalized anxiety disorder: Secondary | ICD-10-CM | POA: Diagnosis not present

## 2021-12-02 DIAGNOSIS — F331 Major depressive disorder, recurrent, moderate: Secondary | ICD-10-CM | POA: Diagnosis not present

## 2021-12-02 DIAGNOSIS — F4310 Post-traumatic stress disorder, unspecified: Secondary | ICD-10-CM | POA: Diagnosis not present

## 2021-12-02 NOTE — Telephone Encounter (Signed)
Requesting test results. Pt states she still has foul odor when urinating. Please call pt back.

## 2021-12-04 ENCOUNTER — Other Ambulatory Visit: Payer: Self-pay | Admitting: Internal Medicine

## 2021-12-04 DIAGNOSIS — F411 Generalized anxiety disorder: Secondary | ICD-10-CM | POA: Diagnosis not present

## 2021-12-04 DIAGNOSIS — F331 Major depressive disorder, recurrent, moderate: Secondary | ICD-10-CM | POA: Diagnosis not present

## 2021-12-04 NOTE — Telephone Encounter (Signed)
Patient called Monday 12/02/2021 and has not heard back from the "Nurse or Dr.".  Pt states she is still having a foul vaginal odor and wants to know what to do.  Please call back.

## 2021-12-05 ENCOUNTER — Other Ambulatory Visit (HOSPITAL_COMMUNITY): Payer: Self-pay

## 2021-12-09 ENCOUNTER — Telehealth: Payer: Self-pay | Admitting: Internal Medicine

## 2021-12-09 NOTE — Telephone Encounter (Signed)
Pt states she was throwing and and staying dizzy all weekend since starting the following medication.  Pt states that she did not have this problem when taking the victoza.  Please call the patient back.  Liraglutide -Weight Management (SAXENDA) 18 MG/3ML SOPN   [facility]

## 2021-12-10 ENCOUNTER — Other Ambulatory Visit (HOSPITAL_COMMUNITY): Payer: Self-pay

## 2021-12-16 DIAGNOSIS — F411 Generalized anxiety disorder: Secondary | ICD-10-CM | POA: Diagnosis not present

## 2021-12-16 DIAGNOSIS — F4310 Post-traumatic stress disorder, unspecified: Secondary | ICD-10-CM | POA: Diagnosis not present

## 2021-12-16 DIAGNOSIS — F331 Major depressive disorder, recurrent, moderate: Secondary | ICD-10-CM | POA: Diagnosis not present

## 2021-12-17 ENCOUNTER — Other Ambulatory Visit (HOSPITAL_COMMUNITY): Payer: Self-pay

## 2021-12-27 ENCOUNTER — Other Ambulatory Visit (HOSPITAL_COMMUNITY): Payer: Self-pay

## 2021-12-27 DIAGNOSIS — F411 Generalized anxiety disorder: Secondary | ICD-10-CM | POA: Diagnosis not present

## 2021-12-27 DIAGNOSIS — F331 Major depressive disorder, recurrent, moderate: Secondary | ICD-10-CM | POA: Diagnosis not present

## 2021-12-27 MED ORDER — LORAZEPAM 0.5 MG PO TABS: 0.5000 mg | ORAL_TABLET | Freq: Two times a day (BID) | ORAL | 0 refills | Status: AC | PRN

## 2021-12-27 MED ORDER — TRAZODONE HCL 50 MG PO TABS
50.0000 mg | ORAL_TABLET | Freq: Every evening | ORAL | 0 refills | Status: AC | PRN
  Filled 2021-12-27 – 2022-01-07 (×2): qty 60, 30d supply, fill #0

## 2021-12-27 MED ORDER — BUSPIRONE HCL 15 MG PO TABS
15.0000 mg | ORAL_TABLET | Freq: Three times a day (TID) | ORAL | 0 refills | Status: AC
  Filled 2021-12-27: qty 90, 30d supply, fill #0

## 2021-12-27 MED ORDER — ESCITALOPRAM OXALATE 20 MG PO TABS
20.0000 mg | ORAL_TABLET | Freq: Every day | ORAL | 0 refills | Status: AC
  Filled 2021-12-27 – 2022-01-07 (×2): qty 30, 30d supply, fill #0

## 2021-12-30 ENCOUNTER — Other Ambulatory Visit (HOSPITAL_COMMUNITY): Payer: Self-pay

## 2022-01-02 DIAGNOSIS — F411 Generalized anxiety disorder: Secondary | ICD-10-CM | POA: Diagnosis not present

## 2022-01-02 DIAGNOSIS — F331 Major depressive disorder, recurrent, moderate: Secondary | ICD-10-CM | POA: Diagnosis not present

## 2022-01-02 DIAGNOSIS — F4310 Post-traumatic stress disorder, unspecified: Secondary | ICD-10-CM | POA: Diagnosis not present

## 2022-01-06 ENCOUNTER — Other Ambulatory Visit (HOSPITAL_COMMUNITY): Payer: Self-pay

## 2022-01-07 ENCOUNTER — Other Ambulatory Visit (HOSPITAL_COMMUNITY): Payer: Self-pay

## 2022-01-14 ENCOUNTER — Ambulatory Visit (INDEPENDENT_AMBULATORY_CARE_PROVIDER_SITE_OTHER): Payer: BC Managed Care – PPO | Admitting: Student

## 2022-01-14 ENCOUNTER — Other Ambulatory Visit (HOSPITAL_COMMUNITY): Payer: Self-pay

## 2022-01-14 ENCOUNTER — Telehealth: Payer: Self-pay

## 2022-01-14 ENCOUNTER — Other Ambulatory Visit: Payer: Self-pay

## 2022-01-14 ENCOUNTER — Other Ambulatory Visit (HOSPITAL_COMMUNITY)
Admission: RE | Admit: 2022-01-14 | Discharge: 2022-01-14 | Disposition: A | Discharge status: Home / Self Care | Payer: BC Managed Care – PPO | Source: Ambulatory Visit | Attending: Internal Medicine | Admitting: Internal Medicine

## 2022-01-14 ENCOUNTER — Encounter: Payer: Self-pay | Admitting: Student

## 2022-01-14 VITALS — BP 132/85 | HR 69 | Temp 98.3°F | Ht 67.0 in | Wt 260.0 lb

## 2022-01-14 DIAGNOSIS — N898 Other specified noninflammatory disorders of vagina: Secondary | ICD-10-CM | POA: Diagnosis not present

## 2022-01-14 DIAGNOSIS — F331 Major depressive disorder, recurrent, moderate: Secondary | ICD-10-CM

## 2022-01-14 DIAGNOSIS — L309 Dermatitis, unspecified: Secondary | ICD-10-CM

## 2022-01-14 DIAGNOSIS — G43709 Chronic migraine without aura, not intractable, without status migrainosus: Secondary | ICD-10-CM

## 2022-01-14 MED ORDER — AJOVY 225 MG/1.5ML ~~LOC~~ SOSY
PREFILLED_SYRINGE | SUBCUTANEOUS | 4 refills | Status: DC
  Filled 2022-01-14: qty 1.5, 30d supply, fill #0
  Filled 2022-02-25: qty 1.5, 30d supply, fill #1

## 2022-01-14 MED ORDER — TRIAMCINOLONE ACETONIDE 0.1 % EX OINT
1.0000 | TOPICAL_OINTMENT | Freq: Two times a day (BID) | CUTANEOUS | 0 refills | Status: DC
  Filled 2022-01-14: qty 30, 15d supply, fill #0

## 2022-01-14 NOTE — Patient Instructions (Signed)
Thank you, Ms.[name] for allowing [place] to provide your care today. Today we discussed.  Migraines We will work to get your Anjovy refilled. Please continue your topomax as well.   Vaginal Odor  We will be sending in your vaginal swab. If positive I will call you with results.   Please continue to follow with your psychologist.     I have ordered the following labs for you:  Lab Orders  No laboratory test(s) ordered today     Referrals ordered today:   Referral Orders  No referral(s) requested today     I have ordered the following medication/changed the following medications:   Stop the following medications: Medications Discontinued During This Encounter  Medication Reason   busPIRone (BUSPAR) 15 MG tablet    LORazepam (ATIVAN) 0.5 MG tablet    LORazepam (ATIVAN) 0.5 MG tablet    LORazepam (ATIVAN) 0.5 MG tablet    Fremanezumab-vfrm (AJOVY) 225 MG/1.5ML SOSY Reorder   escitalopram (LEXAPRO) 10 MG tablet    escitalopram (LEXAPRO) 10 MG tablet    citalopram (CELEXA) 40 MG tablet      Start the following medications: Meds ordered this encounter  Medications   Fremanezumab-vfrm (AJOVY) 225 MG/1.5ML SOSY    Sig: INJECT 225 MG INTO THE SKIN EVERY 30 DAYS.    Dispense:  1.5 mL    Refill:  4     Follow up: As needed    Should you have any questions or concerns please call the internal medicine clinic at [phone].    Thalia Bloodgood, D.O. [facility]

## 2022-01-14 NOTE — Assessment & Plan Note (Signed)
She follows with psychiatry for her major depressive disorder.  We have confirmed her medications with her which consist of BuSpar 15 mg 3 times daily, Lexapro 20 mg daily, trazodone 50 to 100 mg at bedtime and Ativan 0.5 mg twice daily as needed.  She will continue to follow-up with psychiatry

## 2022-01-14 NOTE — Progress Notes (Signed)
CC: Chronic migraines, vaginal odor  HPI:  Stacey Price is a 46 y.o. female living with a history stated below and presents today for chronic migraines and vaginal odor. Please see problem based assessment and plan for additional details.  Past Medical History:  Diagnosis Date   Asthma    Hypertension    Migraines    Obesity    Panic attacks    Peritonitis (HCC) 2014   Pneumonia     Current Outpatient Medications on File Prior to Visit  Medication Sig Dispense Refill   albuterol (PROVENTIL) (2.5 MG/3ML) 0.083% nebulizer solution Take 3 mLs (2.5 mg total) by nebulization every 6 (six) hours as needed for wheezing or shortness of breath. 75 mL 12   albuterol (VENTOLIN HFA) 108 (90 Base) MCG/ACT inhaler Inhale 1-2 puffs into the lungs every 6 (six) hours as needed for wheezing or shortness of breath. 8.5 g 6   Budeson-Glycopyrrol-Formoterol (BREZTRI AEROSPHERE) 160-9-4.8 MCG/ACT AERO Inhale 2 puffs into the lungs in the morning and at bedtime. 10.7 g 11   busPIRone (BUSPAR) 15 MG tablet Take 1 tablet (15 mg total) by mouth 3 (three) times daily. (take with food) 90 tablet 0   cyclobenzaprine (FLEXERIL) 5 MG tablet Take 1 tablet (5 mg total) by mouth 3 (three) times daily as needed for muscle spasms. 30 tablet 0   diclofenac (CATAFLAM) 50 MG tablet Take 1 tablet (50 mg total) by mouth 3 (three) times daily. 90 tablet 3   escitalopram (LEXAPRO) 20 MG tablet Take 1 tablet (20 mg total) by mouth at bedtime. 30 tablet 0   fluticasone (FLONASE) 50 MCG/ACT nasal spray Place 1-2 sprays into both nostrils daily. 16 g 5   Insulin Pen Needle 31G X 5 MM MISC Use as directed with victoza 100 each 0   levocetirizine (XYZAL) 5 MG tablet Take 1 tablet (5 mg total) by mouth in the morning. 30 tablet 3   Liraglutide -Weight Management (SAXENDA) 18 MG/3ML SOPN Inject 3 mg into the skin daily. 45 mL 0   LORazepam (ATIVAN) 0.5 MG tablet Take 1 tablet (0.5 mg total) by mouth 2 (two) times daily as  needed for severe anxiety (1 month supply) 40 tablet 0   metoprolol succinate (TOPROL-XL) 25 MG 24 hr tablet Take 1 tablet (25 mg total) by mouth daily. 30 tablet 2   montelukast (SINGULAIR) 10 MG tablet TAKE 1 TABLET (10 MG TOTAL) BY MOUTH AT BEDTIME. 30 tablet 3   omeprazole (PRILOSEC) 20 MG capsule TAKE 1 CAPSULE (20 MG TOTAL) BY MOUTH DAILY. 30 capsule 3   phenol (CHLORASEPTIC) 1.4 % LIQD Use as directed 1 spray in the mouth or throat as needed for throat irritation / pain. 177 mL 1   prazosin (MINIPRESS) 2 MG capsule Take 1 capsule (2 mg total) by mouth at bedtime. 30 capsule 0   topiramate (TOPAMAX) 50 MG tablet TAKE 2 TABLETS BY MOUTH TWO TIMES DAILY 120 tablet 3   traZODone (DESYREL) 50 MG tablet Take 1-2 tablets (50-100 mg total) by mouth at bedtime as needed for sleep 60 tablet 0   No current facility-administered medications on file prior to visit.    Family History  Problem Relation Age of Onset   Hypertension Mother    Hypertension Father    Breast cancer Cousin    Ovarian cancer Paternal Grandmother     Social History   Socioeconomic History   Marital status: Single    Spouse name: Not on file  Number of children: 2   Years of education: 14   Highest education level: Not on file  Occupational History   Occupation: collections and recovery agent  Tobacco Use   Smoking status: Former    Packs/day: 1.00    Years: 10.00    Total pack years: 10.00    Types: Cigarettes   Smokeless tobacco: Never   Tobacco comments:    has quit smoking as of 5 years ago  Vaping Use   Vaping Use: Former   Devices: just tried when first came out not a regular user  Substance and Sexual Activity   Alcohol use: Not Currently   Drug use: No   Sexual activity: Yes    Partners: Female    Birth control/protection: Surgical  Other Topics Concern   Not on file  Social History Narrative   Lives alone.   Right-handed.   1 cup caffeine some days.   Social Determinants of Health    Financial Resource Strain: High Risk (09/13/2019)   Overall Financial Resource Strain (CARDIA)    Difficulty of Paying Living Expenses: Very hard  Food Insecurity: No Food Insecurity (09/13/2019)   Hunger Vital Sign    Worried About Running Out of Food in the Last Year: Never true    Ran Out of Food in the Last Year: Never true  Transportation Needs: Unmet Transportation Needs (09/13/2019)   PRAPARE - Administrator, Civil Service (Medical): Yes    Lack of Transportation (Non-Medical): Yes  Physical Activity: Not on file  Stress: Not on file  Social Connections: Not on file  Intimate Partner Violence: Not on file    Review of Systems: ROS negative except for what is noted on the assessment and plan.  Vitals:   01/14/22 1051  BP: 132/85  Pulse: 69  Temp: 98.3 F (36.8 C)  TempSrc: Oral  SpO2: 100%  Weight: 260 lb (117.9 kg)  Height: 5\' 7"  (1.702 m)    Physical Exam: Constitutional: in no acute distress HENT: normocephalic atraumatic, mucous membranes moist Eyes: conjunctiva non-erythematous Neck: supple Pulmonary/Chest: normal work of breathing on room air MSK: normal bulk and tone Neurological: alert & oriented x 3 Skin: warm and dry Psych: Normal mood  Assessment & Plan:   Chronic migraine without aura Patient presents today to discuss her chronic migraine headaches.  In the past they were well controlled with Ajovy 225 mg monthly and Topamax 100 mg twice daily.  Unfortunately this medication was not approved for insurance and needed a prior authorization, I am unsure what changed since she was receiving the medication regularly.  During her visit today she endorses her migraines decreased to half the time with this medication as well as with the Topamax.  Other medications in the past have not worked as well.  She was evaluated by neurology few years ago.  We will work with our nursing staff to have prior authorization filled out and hopefully approved  since she has done so well on this medicine. -Send a new prescription for Ajovy to 25 mg monthly and continue Topamax twice daily   Vaginal odor Patient continues to endorse vaginal odor.  She states that she has this twice weekly.  Denies any associated symptoms of discharge or vaginal pain or burning.  In the past she had a vaginal swab that was negative for infectious etiology.  She was frequently douching and using vinegar as well as acidic solutions to clean her vagina.  She has not done  this for the past month but is still having the odor.  We will have her reswab and continue to refrain from using any douching or vaginal cleaning solutions and to continue cleaning the external part of the vagina with nonscented soaps.  -Cervical vaginal swab today, follow-up results for treatment if necessary  Moderate episode of recurrent major depressive disorder [facility]) She follows with psychiatry for her major depressive disorder.  We have confirmed her medications with her which consist of BuSpar 15 mg 3 times daily, Lexapro 20 mg daily, trazodone 50 to 100 mg at bedtime and Ativan 0.5 mg twice daily as needed.  She will continue to follow-up with psychiatry  Patient discussed with Dr. Girtha Rm, D.O. [facility], PGY-3 Phone: [phone] Date 01/14/2022 Time 11:43 AM

## 2022-01-14 NOTE — Assessment & Plan Note (Signed)
Patient presents today to discuss her chronic migraine headaches.  In the past they were well controlled with Ajovy 225 mg monthly and Topamax 100 mg twice daily.  Unfortunately this medication was not approved for insurance and needed a prior authorization, I am unsure what changed since she was receiving the medication regularly.  During her visit today she endorses her migraines decreased to half the time with this medication as well as with the Topamax.  Other medications in the past have not worked as well.  She was evaluated by neurology few years ago.  We will work with our nursing staff to have prior authorization filled out and hopefully approved since she has done so well on this medicine. -Send a new prescription for Ajovy to 25 mg monthly and continue Topamax twice daily

## 2022-01-14 NOTE — Telephone Encounter (Signed)
Pa for pt ( AJOVY )  was completed and submitted with Office notes from today 7/25.. submitted to cover my meds awaiting approval or denial

## 2022-01-14 NOTE — Assessment & Plan Note (Signed)
Patient continues to endorse vaginal odor.  She states that she has this twice weekly.  Denies any associated symptoms of discharge or vaginal pain or burning.  In the past she had a vaginal swab that was negative for infectious etiology.  She was frequently douching and using vinegar as well as acidic solutions to clean her vagina.  She has not done this for the past month but is still having the odor.  We will have her reswab and continue to refrain from using any douching or vaginal cleaning solutions and to continue cleaning the external part of the vagina with nonscented soaps.  -Cervical vaginal swab today, follow-up results for treatment if necessary

## 2022-01-14 NOTE — Addendum Note (Signed)
Addended by: Belva Agee on: 01/14/2022 08:43 PM   Modules accepted: Orders

## 2022-01-14 NOTE — Assessment & Plan Note (Signed)
Patient endorses small, itchy spots that have appeared on and under her breasts and on her extremities. In the past this has improved with triamcinolone cream, patient currently with rash on her right anterior thigh. Will refill triamcinalone, if persistent may need skin biopsy does not appear to have been done in the past.

## 2022-01-14 NOTE — Telephone Encounter (Signed)
DECISION :    Approved today  Your PA request has been approved. Additional information will be provided in the approval communication. (Message 1145)   Drug AJOVY (fremanezumab-vfrm) injection  225MG /1.5ML syringes   Form [employer] Electronic PA Form (2017 NCPDP)     ( COPY SENT TO PHARMACY ALSO )

## 2022-01-15 ENCOUNTER — Other Ambulatory Visit (HOSPITAL_COMMUNITY): Payer: Self-pay

## 2022-01-15 LAB — CERVICOVAGINAL ANCILLARY ONLY
Bacterial Vaginitis (gardnerella): NEGATIVE
Candida Glabrata: NEGATIVE
Candida Vaginitis: NEGATIVE
Chlamydia: NEGATIVE
Comment: NEGATIVE
Comment: NEGATIVE
Comment: NEGATIVE
Comment: NEGATIVE
Comment: NEGATIVE
Comment: NORMAL
Neisseria Gonorrhea: NEGATIVE
Trichomonas: NEGATIVE

## 2022-01-18 ENCOUNTER — Encounter: Payer: Self-pay | Admitting: Student

## 2022-01-20 NOTE — Progress Notes (Signed)
Internal Medicine Clinic Attending  Case discussed with Dr. Katsadouros at the time of the visit.  We reviewed the resident's history and exam and pertinent patient test results.  I agree with the assessment, diagnosis, and plan of care documented in the resident's note.  

## 2022-01-28 ENCOUNTER — Telehealth: Payer: Self-pay | Admitting: Pulmonary Disease

## 2022-01-28 ENCOUNTER — Ambulatory Visit
Admission: RE | Admit: 2022-01-28 | Discharge: 2022-01-28 | Disposition: A | Discharge status: Home / Self Care | Payer: BC Managed Care – PPO | Source: Ambulatory Visit | Attending: Internal Medicine | Admitting: Internal Medicine

## 2022-01-28 DIAGNOSIS — Z1231 Encounter for screening mammogram for malignant neoplasm of breast: Secondary | ICD-10-CM | POA: Diagnosis not present

## 2022-01-28 NOTE — Telephone Encounter (Signed)
Last OV was 12/2019. Does she need an OV prior to the CT scan?  Please advise.

## 2022-01-28 NOTE — Telephone Encounter (Signed)
Called and left voicemail that per MH since her nodule was stable for over 12 months no further follow up CT is required at this time. But if she needed to see MH in office that she can call back to schedule a follow up. Nothing further needed

## 2022-01-28 NOTE — Telephone Encounter (Signed)
She had a CT in summer of 2021 with small right lower lobe nodule. A CT of her abdomen and pelvis was performed in summer of 2022 and the nodule was stable. Per Fleischner criteria, since the single < 6 mm nodule was stable over 12 months, no further follow up CT is needed. I am happy to see her in clinic if I can help in any way.

## 2022-01-29 DIAGNOSIS — F411 Generalized anxiety disorder: Secondary | ICD-10-CM | POA: Diagnosis not present

## 2022-01-29 DIAGNOSIS — F331 Major depressive disorder, recurrent, moderate: Secondary | ICD-10-CM | POA: Diagnosis not present

## 2022-01-29 DIAGNOSIS — F4310 Post-traumatic stress disorder, unspecified: Secondary | ICD-10-CM | POA: Diagnosis not present

## 2022-02-25 ENCOUNTER — Other Ambulatory Visit (HOSPITAL_COMMUNITY): Payer: Self-pay

## 2022-02-26 ENCOUNTER — Other Ambulatory Visit: Payer: Self-pay

## 2022-02-26 ENCOUNTER — Other Ambulatory Visit (HOSPITAL_COMMUNITY): Payer: Self-pay

## 2022-02-26 ENCOUNTER — Telehealth: Payer: Self-pay | Admitting: *Deleted

## 2022-02-26 DIAGNOSIS — J4520 Mild intermittent asthma, uncomplicated: Secondary | ICD-10-CM

## 2022-02-26 DIAGNOSIS — U071 COVID-19: Secondary | ICD-10-CM

## 2022-02-26 DIAGNOSIS — L309 Dermatitis, unspecified: Secondary | ICD-10-CM

## 2022-02-26 MED ORDER — AJOVY 225 MG/1.5ML ~~LOC~~ SOSY
1.5000 mL | PREFILLED_SYRINGE | SUBCUTANEOUS | 4 refills | Status: AC
  Filled 2022-02-26 – 2022-07-10 (×2): qty 1.5, 30d supply, fill #0

## 2022-02-26 MED ORDER — TRIAMCINOLONE ACETONIDE 0.1 % EX OINT
1.0000 | TOPICAL_OINTMENT | Freq: Two times a day (BID) | CUTANEOUS | 0 refills | Status: AC
  Filled 2022-02-26: qty 30, 15d supply, fill #0

## 2022-02-26 MED ORDER — MONTELUKAST SODIUM 10 MG PO TABS
10.0000 mg | ORAL_TABLET | Freq: Every day | ORAL | 3 refills | Status: AC
  Filled 2022-02-26: qty 30, 30d supply, fill #0
  Filled 2022-07-10: qty 30, 30d supply, fill #1

## 2022-02-26 NOTE — Telephone Encounter (Signed)
Patient called in stating Ajovy is no longer covered by her insurance. Cost is $396 at [facility].

## 2022-02-27 ENCOUNTER — Other Ambulatory Visit (HOSPITAL_COMMUNITY): Payer: Self-pay

## 2022-02-27 NOTE — Telephone Encounter (Signed)
Can you have her check with her insurance, or maybe we can call?... to see if Emgality or Aimovig is preferred?  We may have to do a PA to say she has tried all other options first.   Thank you!!

## 2022-02-28 NOTE — Telephone Encounter (Signed)
Okay, I can start her on emgality.  She will need a loading dose and then monthly injections.  Stacey Price - are you able to teach about self administration for these products?  If not, we may need to get the first doses delivered to the clinic and I can do it in an appointment.   Thanks!

## 2022-03-03 ENCOUNTER — Ambulatory Visit (INDEPENDENT_AMBULATORY_CARE_PROVIDER_SITE_OTHER): Payer: BC Managed Care – PPO | Admitting: Internal Medicine

## 2022-03-03 ENCOUNTER — Other Ambulatory Visit (HOSPITAL_COMMUNITY): Payer: Self-pay

## 2022-03-03 ENCOUNTER — Encounter: Payer: Self-pay | Admitting: Internal Medicine

## 2022-03-03 DIAGNOSIS — R8290 Unspecified abnormal findings in urine: Secondary | ICD-10-CM

## 2022-03-03 MED ORDER — SULFAMETHOXAZOLE-TRIMETHOPRIM 800-160 MG PO TABS
1.0000 | ORAL_TABLET | Freq: Two times a day (BID) | ORAL | 0 refills | Status: AC
  Filled 2022-03-03: qty 6, 3d supply, fill #0

## 2022-03-03 NOTE — Assessment & Plan Note (Signed)
Patient presents with several month history of urine odor. This started around May. She was seen in [facility] 7/23 for this and a self-swab was done to check for BV, which was negative. She continues to have bad odor. She increased amount she was drinking and this has not improved smell.  A/P: Patient denies dysuria and urinary frequency. I do not think she has an active infection. Since the smell is affecting her quality of life, I talked with her about coming in to provide a urine sample to see if she may be colonized with bacteria as Proteus could explain the smell. She is concerned about the cost of the labs as she recently lost her insurance. -bactrim 800-160 mg BID for 3 days

## 2022-03-03 NOTE — Patient Instructions (Signed)
Thank you, Ms.[name] for allowing [place] to provide your care today.  Urine odor- I sent in an antibiotic for this.  I have ordered the following labs for you:   Lab Orders         Culture, Urine         Urinalysis, Reflex Microscopic       Referrals ordered today:   Referral Orders  No referral(s) requested today     I have ordered the following medication/changed the following medications:   Stop the following medications: Medications Discontinued During This Encounter  Medication Reason   cyclobenzaprine (FLEXERIL) 5 MG tablet Completed Course     Start the following medications: Meds ordered this encounter  Medications   sulfamethoxazole-trimethoprim (BACTRIM DS) 800-160 MG tablet    Sig: Take 1 tablet by mouth 2 (two) times daily.    Dispense:  6 tablet    Refill:  0     We look forward to seeing you next time. Please call our clinic at [phone] if you have any questions or concerns. The best time to call is Monday-Friday from 9am-4pm, but there is someone available 24/7. If after hours or the weekend, call the main hospital number and ask for the Internal Medicine Resident On-Call. If you need medication refills, please notify your pharmacy one week in advance and they will send [place] a request.   Thank you for trusting me with your care. Wishing you the best!   Rudene Christians, DO [facility]

## 2022-03-03 NOTE — Progress Notes (Signed)
   I connected with  Stacey Price on 03/03/22 by telephone and verified that I am speaking with the correct person using two identifiers.   I discussed the limitations of evaluation and management by telemedicine. The patient expressed understanding and agreed to proceed.  CC: urine odor  This is a telephone encounter between Stacey Price and [employer] on 03/03/2022 for urine odor, she was diagnosed with COVID 7 days ago and switched appointment to televisit. The visit was conducted with the patient located at home and Rudene Christians at [facility]. The patient's identity was confirmed using their DOB and current address. The patient has consented to being evaluated through a telephone encounter and understands the associated risks (an examination cannot be done and the patient may need to come in for an appointment) / benefits (allows the patient to remain at home, decreasing exposure to coronavirus). I personally spent 10 minutes on medical discussion.   HPI:  [name] is a 46 y.o. with PMH as below.   Please see A&P for assessment of the patient's acute and chronic medical conditions.   Past Medical History:  Diagnosis Date   Asthma    Hypertension    Migraines    Obesity    Panic attacks    Peritonitis (HCC) 2014   Pneumonia    Viral infection 04/10/2021   Review of Systems:  denies dysuria, fatigue, change in urinary frequency  Assessment & Plan:   Abnormal urine odor Patient presents with several month history of urine odor. This started around May. She was seen in [facility] 7/23 for this and a self-swab was done to check for BV, which was negative. She continues to have bad odor. She increased amount she was drinking and this has not improved smell.  A/P: Patient denies dysuria and urinary frequency. I do not think she has an active infection. Since the smell is affecting her quality of life, I talked with her about coming in to provide a urine sample to see if she may be  colonized with bacteria as Proteus could explain the smell. She is concerned about the cost of the labs as she recently lost her insurance. -bactrim 800-160 mg BID for 3 days    Patient discussed with Dr. Marjorie Smolder Wendolyn Raso, D.O. [facility]  PGY-1 Pager: [phone]  Phone: [phone] Date 03/03/2022  Time 1:11 PM

## 2022-03-07 ENCOUNTER — Telehealth: Payer: Self-pay

## 2022-03-07 NOTE — Telephone Encounter (Signed)
RTC to patient continues to have fevers and Chills.  Is drinking fluids and resting .  Needs note to be sent to her job.  Send to [employer].  Can be sent via MyChart if possible and she will send to her job.

## 2022-03-07 NOTE — Telephone Encounter (Signed)
Pt is requesting a call back .[name] She stated that she is still not feeling well  with Covid  positive  retest on 9/13  .[name] She is wanting a work note stating she can still be out

## 2022-03-08 ENCOUNTER — Encounter: Payer: Self-pay | Admitting: Internal Medicine

## 2022-03-10 NOTE — Telephone Encounter (Signed)
Pt stating she has not rec'd a message back in My chart.  Pt states she has been out of work due to Positive Covid Test.  Please call the patient back.

## 2022-03-11 NOTE — Telephone Encounter (Signed)
This is done, in [place].  Thank you

## 2022-03-12 NOTE — Progress Notes (Signed)
Internal Medicine Clinic Attending  Case discussed with Dr. Masters  At the time of the visit.  We reviewed the resident's history and exam and pertinent patient test results.  I agree with the assessment, diagnosis, and plan of care documented in the resident's note.  

## 2022-07-10 ENCOUNTER — Other Ambulatory Visit (HOSPITAL_COMMUNITY): Payer: Self-pay

## 2022-07-11 ENCOUNTER — Other Ambulatory Visit: Payer: Self-pay

## 2022-07-11 ENCOUNTER — Other Ambulatory Visit (HOSPITAL_COMMUNITY): Payer: Self-pay

## 2022-07-23 ENCOUNTER — Other Ambulatory Visit (HOSPITAL_COMMUNITY): Payer: Self-pay
